# Patient Record
Sex: Male | Born: 1940
Health system: Southern US, Community
[De-identification: ages and names within clinical notes are randomized; demographics above are authoritative.]

## PROBLEM LIST (undated history)

## (undated) ENCOUNTER — Emergency Department: Admission: EM | Payer: Medicare HMO | Source: Home / Self Care

## (undated) DIAGNOSIS — M1990 Unspecified osteoarthritis, unspecified site: Secondary | ICD-10-CM

## (undated) DIAGNOSIS — D125 Benign neoplasm of sigmoid colon: Secondary | ICD-10-CM

## (undated) DIAGNOSIS — N529 Male erectile dysfunction, unspecified: Secondary | ICD-10-CM

## (undated) DIAGNOSIS — N419 Inflammatory disease of prostate, unspecified: Secondary | ICD-10-CM

## (undated) DIAGNOSIS — D72819 Decreased white blood cell count, unspecified: Secondary | ICD-10-CM

## (undated) DIAGNOSIS — D649 Anemia, unspecified: Secondary | ICD-10-CM

## (undated) DIAGNOSIS — R972 Elevated prostate specific antigen [PSA]: Secondary | ICD-10-CM

## (undated) DIAGNOSIS — J309 Allergic rhinitis, unspecified: Secondary | ICD-10-CM

## (undated) DIAGNOSIS — F319 Bipolar disorder, unspecified: Secondary | ICD-10-CM

## (undated) DIAGNOSIS — K635 Polyp of colon: Secondary | ICD-10-CM

## (undated) DIAGNOSIS — K219 Gastro-esophageal reflux disease without esophagitis: Secondary | ICD-10-CM

## (undated) HISTORY — DX: Gastro-esophageal reflux disease without esophagitis: K21.9

## (undated) HISTORY — DX: Bipolar disorder, unspecified: F31.9

## (undated) HISTORY — DX: Anemia, unspecified: D64.9

## (undated) HISTORY — PX: HERNIA REPAIR: SHX51

## (undated) HISTORY — DX: Unspecified osteoarthritis, unspecified site: M19.90

## (undated) HISTORY — DX: Male erectile dysfunction, unspecified: N52.9

## (undated) HISTORY — DX: Benign neoplasm of sigmoid colon: D12.5

## (undated) HISTORY — DX: Polyp of colon: K63.5

## (undated) HISTORY — DX: Inflammatory disease of prostate, unspecified: N41.9

## (undated) HISTORY — DX: Allergic rhinitis, unspecified: J30.9

## (undated) HISTORY — DX: Elevated prostate specific antigen (PSA): R97.20

## (undated) HISTORY — DX: Decreased white blood cell count, unspecified: D72.819

## (undated) HISTORY — PX: OTHER SURGICAL HISTORY: SHX169

---

## 1998-07-28 ENCOUNTER — Encounter: Payer: Self-pay | Admitting: Orthopedic Surgery

## 1998-07-28 ENCOUNTER — Ambulatory Visit (HOSPITAL_COMMUNITY): Admission: RE | Admit: 1998-07-28 | Discharge: 1998-07-28 | Payer: Self-pay | Admitting: Orthopedic Surgery

## 2007-02-08 ENCOUNTER — Encounter: Payer: Self-pay | Admitting: Orthopaedic Surgery

## 2007-02-22 ENCOUNTER — Encounter: Payer: Self-pay | Admitting: Orthopaedic Surgery

## 2007-05-15 ENCOUNTER — Inpatient Hospital Stay (HOSPITAL_COMMUNITY): Admission: RE | Admit: 2007-05-15 | Discharge: 2007-05-17 | Payer: Self-pay | Admitting: Orthopedic Surgery

## 2007-06-20 ENCOUNTER — Ambulatory Visit: Payer: Self-pay

## 2008-01-10 ENCOUNTER — Ambulatory Visit (HOSPITAL_COMMUNITY): Admission: RE | Admit: 2008-01-10 | Discharge: 2008-01-10 | Payer: Self-pay | Admitting: Orthopedic Surgery

## 2008-05-19 ENCOUNTER — Ambulatory Visit: Payer: Self-pay | Admitting: Gastroenterology

## 2008-07-28 ENCOUNTER — Ambulatory Visit: Payer: Self-pay

## 2008-07-31 ENCOUNTER — Ambulatory Visit: Payer: Self-pay

## 2009-07-16 ENCOUNTER — Emergency Department: Payer: Self-pay | Admitting: Emergency Medicine

## 2009-08-04 ENCOUNTER — Ambulatory Visit: Payer: Self-pay

## 2009-12-30 ENCOUNTER — Encounter: Payer: Self-pay | Admitting: Rheumatology

## 2010-01-21 ENCOUNTER — Encounter: Payer: Self-pay | Admitting: Rheumatology

## 2010-04-28 ENCOUNTER — Ambulatory Visit: Payer: Self-pay

## 2010-07-06 NOTE — Op Note (Signed)
NAME:  [name], [name] NO.:  [id number]   MEDICAL RECORD NO.:  [id number]          PATIENT TYPE:  INP   LOCATION:  2899                         FACILITY:  [facility]   PHYSICIAN:  Katy Fitch. Sypher, M.D. DATE OF BIRTH:  [birth year]   DATE OF PROCEDURE:  05/15/2007  DATE OF DISCHARGE:                               OPERATIVE REPORT   PREOPERATIVE DIAGNOSIS:  Complex right shoulder chronic rotator cuff  tear arthropathy with grade 4 chondromalacia of humeral head and grade 4  chondromalacia of glenoid with marked glenoid remodeling and humeral  head remodeling.   POSTOPERATIVE DIAGNOSIS:  Complex right shoulder chronic rotator cuff  tear arthropathy with grade 4 chondromalacia of humeral head and grade 4  chondromalacia of glenoid with marked glenoid remodeling and humeral  head remodeling.  Identification of very complex aberrant anatomy of  humeral head and glenoid with multiple osteocartilaginous loose bodies  present in the posterior and inferior shoulder capsule.   OPERATION:  Right total shoulder implant arthroplasty utilizing a Biomet  Biomodular 54 x 22 mm offset head and an 11 mm x 115 mm stem and a small  polyethylene inset glenoid with Simplex cement and vancomycin  impregnation.   OPERATING SURGEON:  Katy Fitch. Sypher, M.D.   ASSISTANT:  Molly Maduro Dasnoit PA-C.   ANESTHESIA:  General endotracheal supplemented by a right interscalene  block.   SUPERVISING ANESTHESIOLOGIST:  Dr. Judie Petit.   INDICATIONS:  David [name] is a 70 year old right-hand-dominant retired  gentleman who is a long-term patient of our practice.  In 1994, he  underwent a right shoulder arthroscopy for impingement.  He had a  subacromial decompression and distal clavicle resection.  Over the  subsequent 6 years, he developed progressive shoulder pain.  In 2000, he  had a shoulder arthroscopy which revealed an irreparable chronic rotator  cuff tear.  He was debrided at that time.   He had another 8 years of good service of his shoulder following  debridement and was able to elevate 150 degrees.  However, he began to  experience progressive pain including night pain, weakness of abduction,  external rotation and returned in 2008 to discuss his shoulder.   At that time we discussed the possibilities of reconstructing his  shoulder with a possible extended surface area hemiarthroplasty versus a  total shoulder arthroplasty versus a reverse arthroplasty.   Given his active lifestyle and the fact he had marked deformity of both  his glenoid and humeral head, in my judgment it appeared that he would  be a proper candidate for a hemiarthroplasty and glenoid component.   He had profound eburnation of his humeral head and a significant  osteophyte forming posteriorly and dorsally which had been articulating  against his acromion.   After lengthy informed consent, during which we discussed the possible  outcomes of [place] not fully relieving his pain and not restoring his full  range of motion as well as listing the potential complications of  surgery including neurovascular injury, infection, component loosening  and/or component failure and the need for later revision, [name]  presents  to the operating at this time anticipating a right total  shoulder arthroplasty.   Preoperatively, he was advised that we would use prophylactic  antibiotics in the form of Ancef and, in my experience, I also use  vancomycin in the early postoperative period to cover MRSA.  We will use  vancomycin impregnated cement for his glenoid.   Questions were invited and answered in detail.   PROCEDURE:  [name] is brought to the operating room and placed in  supine position upon the operating table.   Following the induction of interscalene block by Dr. Randa Evens in the  holding area, satisfactory anesthesia of the right arm and forequarter  was achieved.   [name] is brought to room  #1 placed in supine position upon the  operating table and after proper time-out and site identification  protocol, he was placed under general endotracheal anesthesia.  He was  carefully positioned with a specialized torso and head holder designed  for shoulder arthroplasty.   The entire right upper extremity and forequarter were prepped with  DuraPrep and draped with impervious arthroscopy drapes.  His axilla was  prepped with a clipper and his skin was prepared for postoperative tape  application.   After completion of a second time-out protocol, we proceeded directly to  the arthroplasty with creation of 15 cm anterior deltopectoral incision.  Subcutaneous tissues were carefully divided, taking care to identify the  cephalic vein.  The subdermal bleeders were electrocauterized with the  Bovie, followed by development of the deltopectoral interval.  The  clavipectoral fascia was identified and the short head of the biceps  partially released and tagged.  A self-retaining retractor was placed  allowing exposure of the anterior capsule and subscapularis bursa.  The  bursa was dissected off revealing the subscapularis.  The biceps long  head was identified followed by capsulotomy.  There was profound  thickening of the capsule and hypertrophy the coracohumeral ligament.   With a fine sharp osteotome, the capsules peeled off the lesser  tuberosity and the subscapularis tag.  After release of the  subscapularis, with great care we identified the inferior osteophyte of  the humeral head and using a Joker, elevated to protect the axillary  nerve, meticulously and piecemeal removed the inferior, anterior and  posterior osteophyte after presenting the humeral head superiorly.   After the neck of the humerus was carefully identified, the capsule was  debrided.  There was noted to be several large loose bodies posteriorly  that were removed with digital dissection and purchase.   A Cuda  retractor was placed followed by preparation of the glenoid with  a power bur using around diamond bur and an oval fluted nerve.  Due to  the fact that [name] had profound marble-like bone, the glenoid  perforation consumed nearly 1 hour.   We are able to create a inset corresponding to the shape of the medium  glenoid, however, due to some insufficiency of the glenoid anteriorly, I  elected ultimately to use a small implant.   A bathtub-type inset was created followed by four cement holes.  The  keel was dug out with a power bur followed by use of angled curettes to  undercut the subchondral bone.   After routine preparation of the glenoid with irrigation and careful  drying, the glenoid component was placed with vancomycin impregnated  cement.   This was held in position for 11 minutes as the cement set.  Redundant  cement posteriorly  was removed with a fine rongeur.   I thoroughly examined the capsule and prior to placing the humeral stem,  identified two additional inferior posterior loose bodies that were  essentially pedunculated and involved the capsule.   This required a rather complex dissection to reach and ultimately we  used both upbiting and neutral pituitary rongeurs to remove these  piecemeal.   After thorough capsular lavage, we prepared the humerus.  The humerus  was delivered.  The capsule protected.  Broaches and cutting tools were  used to repair the humerus to accept an 11 mm Biomet Biomodular stem.  Bone graft in the humeral head had been harvested and was placed in the  medullary canal to improve purchase of the stem.  The 11 mm stem was  placed with no-touch technique after cutting a dorsal keel defect in the  greater tuberosity.   The neck was driven to the humeral calcar and version was carefully  controlled.   Due to Mr. Rottmann's oversized head he had developed over the past 8  years, we ultimately used the largest offset 54 x 22 mm head to   reconstruct his humeral head.  The EAS was unsatisfactory due to not  enough diameter to cover the osteophyte at the greater tuberosity.  We  tailored the osteophyte slightly to create a very congruous fit with the  54 x 22 mm head using the offset in the #1 position superiorly.   After the humeral component was placed with standard technique, it was  tamped home on the reverse Morse taper.  The glenohumeral joint was  thoroughly lavaged followed by capsule repaired with through-bone  sutures to the lesser tuberosity.  Care was taken to reconstruct the  coracohumeral ligament superiorly.   A 7 mm JP drain was placed in the deltopectoral interval up to the  inferior capsule to prevent hematoma collection followed by exit of the  drain through a lateral stab wound at the inferior deltoid.  The wound  was then thoroughly irrigated with sterile saline followed by closure  with subdermal suture of 0 Vicryl, 2-0 Vicryl and intradermal 3-0  Prolene with Steri-Strips.   There were no apparent complications.   This case would be classified as extremely difficult due to the marked  deformity of the glenoid and humeral head as well as having to retrieve  the multiple posterior inferior loose bodies.   A very congruous articulation was achieved.  There were no apparent  complications.   Upon descrubbing, I identified that despite changing my gloves with each  component, tying Kevlar knots with the closure led to a failure of a  double gloved thumb on the left hand.  We had thoroughly irrigated the  wound and provided both preoperative Ancef and vancomycin.   We will continue Ancef and vancomycin for 48 hours postoperatively.   Mr. Pasqual was placed in a sling, awakened from general anesthesia and  transferred to the recovery room with stable vital signs.      Katy Fitch Sypher, M.D.  Electronically Signed     RVS/MEDQ  D:  05/15/2007  T:  05/15/2007  Job:  [id number]

## 2010-11-15 LAB — CBC
HCT: 35 — ABNORMAL LOW
HCT: 50.2
Hemoglobin: 12 — ABNORMAL LOW
Hemoglobin: 16.9
MCHC: 33.7
MCHC: 34.2
MCV: 92.2
MCV: 92.3
Platelets: 163
Platelets: 192
RBC: 3.8 — ABNORMAL LOW
RBC: 5.44
RDW: 13.9
RDW: 14.6
WBC: 4.1
WBC: 6.9

## 2010-11-15 LAB — BASIC METABOLIC PANEL
BUN: 14
BUN: 14
CO2: 28
CO2: 29
Calcium: 10.2
Calcium: 8.3 — ABNORMAL LOW
Chloride: 105
Chloride: 108
Creatinine, Ser: 0.92
Creatinine, Ser: 0.95
GFR calc Af Amer: >60
GFR calc Af Amer: >60
GFR calc non Af Amer: >60
GFR calc non Af Amer: >60
Glucose, Bld: 122 — ABNORMAL HIGH
Glucose, Bld: 90
Potassium: 4.3
Potassium: 5.3 — ABNORMAL HIGH
Sodium: 142
Sodium: 142

## 2010-12-10 ENCOUNTER — Ambulatory Visit: Payer: Self-pay | Admitting: Internal Medicine

## 2010-12-17 ENCOUNTER — Ambulatory Visit: Payer: Self-pay | Admitting: Internal Medicine

## 2011-01-24 ENCOUNTER — Ambulatory Visit: Payer: Self-pay

## 2011-02-28 ENCOUNTER — Encounter: Payer: Self-pay | Admitting: Orthopedic Surgery

## 2011-03-25 ENCOUNTER — Encounter: Payer: Self-pay | Admitting: Orthopedic Surgery

## 2011-05-10 ENCOUNTER — Ambulatory Visit: Payer: Self-pay

## 2011-05-10 LAB — BASIC METABOLIC PANEL
Anion Gap: 6 — ABNORMAL LOW (ref 7–16)
BUN: 21 mg/dL — ABNORMAL HIGH (ref 7–18)
Calcium, Total: 9.6 mg/dL (ref 8.5–10.1)
EGFR (African American): >60
EGFR (Non-African Amer.): 56 — ABNORMAL LOW
Glucose: 142 mg/dL — ABNORMAL HIGH (ref 65–99)
Osmolality: 294 (ref 275–301)
Potassium: 4.7 mmol/L (ref 3.5–5.1)

## 2011-05-10 LAB — CBC WITH DIFFERENTIAL/PLATELET
Basophil %: 0.5 %
Eosinophil #: 0.1 10*3/uL (ref 0.0–0.7)
Eosinophil %: 1.8 %
HGB: 15.6 g/dL (ref 13.0–18.0)
Lymphocyte #: 1 10*3/uL (ref 1.0–3.6)
MCH: 31.7 pg (ref 26.0–34.0)
MCHC: 33 g/dL (ref 32.0–36.0)
MCV: 96 fL (ref 80–100)
Monocyte #: 0.3 10*3/uL (ref 0.0–0.7)
Monocyte %: 6.6 %
Neutrophil #: 3.3 10*3/uL (ref 1.4–6.5)
Neutrophil %: 70.5 %
RBC: 4.93 10*6/uL (ref 4.40–5.90)

## 2011-09-14 ENCOUNTER — Emergency Department: Payer: Self-pay | Admitting: *Deleted

## 2011-09-14 LAB — URINALYSIS, COMPLETE
Bacteria: NONE SEEN
Bilirubin,UR: NEGATIVE
Blood: NEGATIVE
Glucose,UR: NEGATIVE mg/dL (ref 0–75)
Leukocyte Esterase: NEGATIVE
Nitrite: NEGATIVE
Protein: NEGATIVE
RBC,UR: 10 /HPF (ref 0–5)
Specific Gravity: 1.014 (ref 1.003–1.030)
Squamous Epithelial: NONE SEEN
WBC UR: 1 /HPF (ref 0–5)

## 2011-09-14 LAB — BASIC METABOLIC PANEL
Anion Gap: 9 (ref 7–16)
BUN: 18 mg/dL (ref 7–18)
Calcium, Total: 9.1 mg/dL (ref 8.5–10.1)
Chloride: 107 mmol/L (ref 98–107)
Co2: 23 mmol/L (ref 21–32)
Osmolality: 280 (ref 275–301)
Potassium: 4.1 mmol/L (ref 3.5–5.1)

## 2011-10-21 ENCOUNTER — Ambulatory Visit: Payer: Self-pay | Admitting: Gastroenterology

## 2011-10-25 LAB — PATHOLOGY REPORT

## 2012-10-20 HISTORY — PX: COLONOSCOPY: SHX174

## 2012-11-21 ENCOUNTER — Ambulatory Visit: Payer: Self-pay | Admitting: Internal Medicine

## 2012-12-13 ENCOUNTER — Ambulatory Visit: Payer: Self-pay | Admitting: Internal Medicine

## 2012-12-20 LAB — FERRITIN: Ferritin (ARMC): 19 ng/mL (ref 8–388)

## 2012-12-20 LAB — LACTATE DEHYDROGENASE: LDH: 334 U/L — ABNORMAL HIGH (ref 85–241)

## 2012-12-20 LAB — PROTIME-INR: INR: 0.9

## 2012-12-20 LAB — BASIC METABOLIC PANEL
Calcium, Total: 9 mg/dL (ref 8.5–10.1)
Co2: 30 mmol/L (ref 21–32)
Creatinine: 0.96 mg/dL (ref 0.60–1.30)
EGFR (African American): >60
EGFR (Non-African Amer.): >60
Glucose: 103 mg/dL — ABNORMAL HIGH (ref 65–99)
Osmolality: 291 (ref 275–301)
Potassium: 3.9 mmol/L (ref 3.5–5.1)
Sodium: 144 mmol/L (ref 136–145)

## 2012-12-20 LAB — URINALYSIS, COMPLETE
Bilirubin,UR: NEGATIVE
Glucose,UR: NEGATIVE mg/dL (ref 0–75)
Ketone: NEGATIVE
Nitrite: NEGATIVE
Specific Gravity: 1.02 (ref 1.003–1.030)

## 2012-12-20 LAB — CBC CANCER CENTER
Eosinophil: 5 %
HCT: 39.1 % — ABNORMAL LOW (ref 40.0–52.0)
HGB: 12.6 g/dL — ABNORMAL LOW (ref 13.0–18.0)
Lymphocytes: 30 %
MCH: 30.4 pg (ref 26.0–34.0)
MCHC: 32.3 g/dL (ref 32.0–36.0)
MCV: 94 fL (ref 80–100)
Monocytes: 7 %
RBC: 4.16 10*6/uL — ABNORMAL LOW (ref 4.40–5.90)
Segmented Neutrophils: 49 %
Variant Lymphocyte: 6 %
WBC: 3.5 x10 3/mm — ABNORMAL LOW (ref 3.8–10.6)

## 2012-12-20 LAB — FOLATE: Folic Acid: 38.1 ng/mL (ref 3.1–100.0)

## 2012-12-20 LAB — IRON AND TIBC
Iron Bind.Cap.(Total): 416 ug/dL (ref 250–450)
Iron Saturation: 8 %
Unbound Iron-Bind.Cap.: 381 ug/dL

## 2012-12-20 LAB — RETICULOCYTES: Reticulocyte: 1.63 % (ref 0.4–3.1)

## 2012-12-20 LAB — MAGNESIUM: Magnesium: 2.2 mg/dL

## 2012-12-20 LAB — APTT: Activated PTT: 30.9 secs (ref 23.6–35.9)

## 2012-12-22 ENCOUNTER — Ambulatory Visit: Payer: Self-pay | Admitting: Internal Medicine

## 2012-12-24 LAB — URINE IEP, RANDOM

## 2013-01-21 ENCOUNTER — Ambulatory Visit: Payer: Self-pay | Admitting: Internal Medicine

## 2013-03-21 ENCOUNTER — Encounter: Payer: Self-pay | Admitting: Orthopedic Surgery

## 2013-03-24 ENCOUNTER — Encounter: Payer: Self-pay | Admitting: Orthopedic Surgery

## 2013-04-04 ENCOUNTER — Ambulatory Visit: Payer: Self-pay | Admitting: Internal Medicine

## 2013-04-04 LAB — CBC CANCER CENTER
BASOS PCT: 1.1 %
Basophil #: 0 x10 3/mm (ref 0.0–0.1)
EOS ABS: 0.1 x10 3/mm (ref 0.0–0.7)
Eosinophil %: 2.9 %
HCT: 49.3 % (ref 40.0–52.0)
HGB: 16.3 g/dL (ref 13.0–18.0)
LYMPHS ABS: 1.2 x10 3/mm (ref 1.0–3.6)
Lymphocyte %: 36.7 %
MCH: 30.2 pg (ref 26.0–34.0)
MCHC: 33.1 g/dL (ref 32.0–36.0)
MCV: 91 fL (ref 80–100)
MONO ABS: 0.4 x10 3/mm (ref 0.2–1.0)
Monocyte %: 11.2 %
NEUTROS ABS: 1.6 x10 3/mm (ref 1.4–6.5)
NEUTROS PCT: 48.1 %
Platelet: 200 x10 3/mm (ref 150–440)
RBC: 5.39 10*6/uL (ref 4.40–5.90)
RDW: 18.4 % — ABNORMAL HIGH (ref 11.5–14.5)
WBC: 3.4 x10 3/mm — ABNORMAL LOW (ref 3.8–10.6)

## 2013-04-04 LAB — IRON AND TIBC
Iron Bind.Cap.(Total): 362 ug/dL (ref 250–450)
Iron Saturation: 23 %
Iron: 84 ug/dL (ref 65–175)
Unbound Iron-Bind.Cap.: 278 ug/dL

## 2013-04-04 LAB — LACTATE DEHYDROGENASE: LDH: 328 U/L — ABNORMAL HIGH (ref 85–241)

## 2013-04-04 LAB — FERRITIN: Ferritin (ARMC): 41 ng/mL (ref 8–388)

## 2013-04-21 ENCOUNTER — Ambulatory Visit: Payer: Self-pay | Admitting: Internal Medicine

## 2013-06-27 ENCOUNTER — Ambulatory Visit: Payer: Self-pay | Admitting: Internal Medicine

## 2013-06-27 LAB — CBC CANCER CENTER
BASOS ABS: 0 x10 3/mm (ref 0.0–0.1)
Basophil %: 0.8 %
EOS ABS: 0.1 x10 3/mm (ref 0.0–0.7)
Eosinophil %: 4.3 %
HCT: 45 % (ref 40.0–52.0)
HGB: 15.1 g/dL (ref 13.0–18.0)
Lymphocyte #: 1.3 x10 3/mm (ref 1.0–3.6)
Lymphocyte %: 40.7 %
MCH: 31.7 pg (ref 26.0–34.0)
MCHC: 33.6 g/dL (ref 32.0–36.0)
MCV: 94 fL (ref 80–100)
MONO ABS: 0.4 x10 3/mm (ref 0.2–1.0)
MONOS PCT: 13 %
NEUTROS PCT: 41.2 %
Neutrophil #: 1.3 x10 3/mm — ABNORMAL LOW (ref 1.4–6.5)
PLATELETS: 177 x10 3/mm (ref 150–440)
RBC: 4.79 10*6/uL (ref 4.40–5.90)
RDW: 13.9 % (ref 11.5–14.5)
WBC: 3.2 x10 3/mm — ABNORMAL LOW (ref 3.8–10.6)

## 2013-06-27 LAB — IRON AND TIBC
IRON BIND. CAP.(TOTAL): 292 ug/dL (ref 250–450)
Iron Saturation: 31 %
Iron: 90 ug/dL (ref 65–175)
Unbound Iron-Bind.Cap.: 202 ug/dL

## 2013-06-27 LAB — FERRITIN: Ferritin (ARMC): 74 ng/mL (ref 8–388)

## 2013-06-27 LAB — LACTATE DEHYDROGENASE: LDH: 291 U/L — ABNORMAL HIGH (ref 85–241)

## 2013-07-22 ENCOUNTER — Ambulatory Visit: Payer: Self-pay | Admitting: Internal Medicine

## 2013-08-18 ENCOUNTER — Ambulatory Visit: Payer: Self-pay

## 2013-09-02 ENCOUNTER — Ambulatory Visit: Payer: Self-pay | Admitting: Internal Medicine

## 2013-09-02 LAB — CBC CANCER CENTER
BASOS PCT: 1 %
Basophil #: 0 x10 3/mm (ref 0.0–0.1)
EOS ABS: 0.2 x10 3/mm (ref 0.0–0.7)
EOS PCT: 5.5 %
HCT: 46.7 % (ref 40.0–52.0)
HGB: 15.4 g/dL (ref 13.0–18.0)
Lymphocyte #: 1.3 x10 3/mm (ref 1.0–3.6)
Lymphocyte %: 39 %
MCH: 31.8 pg (ref 26.0–34.0)
MCHC: 33 g/dL (ref 32.0–36.0)
MCV: 96 fL (ref 80–100)
Monocyte #: 0.4 x10 3/mm (ref 0.2–1.0)
Monocyte %: 11.1 %
Neutrophil #: 1.5 x10 3/mm (ref 1.4–6.5)
Neutrophil %: 43.4 %
PLATELETS: 172 x10 3/mm (ref 150–440)
RBC: 4.85 10*6/uL (ref 4.40–5.90)
RDW: 14.4 % (ref 11.5–14.5)
WBC: 3.4 x10 3/mm — ABNORMAL LOW (ref 3.8–10.6)

## 2013-09-02 LAB — LACTATE DEHYDROGENASE: LDH: 247 U/L — ABNORMAL HIGH (ref 85–241)

## 2013-09-21 ENCOUNTER — Ambulatory Visit: Payer: Self-pay | Admitting: Internal Medicine

## 2013-11-04 ENCOUNTER — Ambulatory Visit: Payer: Self-pay | Admitting: Internal Medicine

## 2013-11-04 LAB — CBC CANCER CENTER
BASOS PCT: 0.9 %
Basophil #: 0 x10 3/mm (ref 0.0–0.1)
EOS PCT: 4.8 %
Eosinophil #: 0.2 x10 3/mm (ref 0.0–0.7)
HCT: 47.9 % (ref 40.0–52.0)
HGB: 15.8 g/dL (ref 13.0–18.0)
LYMPHS ABS: 1.1 x10 3/mm (ref 1.0–3.6)
Lymphocyte %: 31.5 %
MCH: 31.9 pg (ref 26.0–34.0)
MCHC: 33.1 g/dL (ref 32.0–36.0)
MCV: 96 fL (ref 80–100)
Monocyte #: 0.4 x10 3/mm (ref 0.2–1.0)
Monocyte %: 11.9 %
NEUTROS ABS: 1.8 x10 3/mm (ref 1.4–6.5)
NEUTROS PCT: 50.9 %
Platelet: 205 x10 3/mm (ref 150–440)
RBC: 4.97 10*6/uL (ref 4.40–5.90)
RDW: 14 % (ref 11.5–14.5)
WBC: 3.5 x10 3/mm — ABNORMAL LOW (ref 3.8–10.6)

## 2013-11-04 LAB — IRON AND TIBC
Iron Bind.Cap.(Total): 316 ug/dL
Iron Saturation: 48 %
Iron: 152 ug/dL
Unbound Iron-Bind.Cap.: 164 ug/dL

## 2013-11-04 LAB — LACTATE DEHYDROGENASE: LDH: 338 U/L — ABNORMAL HIGH

## 2013-11-04 LAB — FERRITIN: Ferritin (ARMC): 75 ng/mL

## 2013-11-21 ENCOUNTER — Ambulatory Visit: Payer: Self-pay | Admitting: Internal Medicine

## 2013-12-26 ENCOUNTER — Ambulatory Visit: Payer: Self-pay | Admitting: Neurology

## 2013-12-30 ENCOUNTER — Ambulatory Visit: Payer: Self-pay | Admitting: Internal Medicine

## 2014-02-26 ENCOUNTER — Encounter: Payer: Self-pay | Admitting: Neurology

## 2014-02-26 DIAGNOSIS — R262 Difficulty in walking, not elsewhere classified: Secondary | ICD-10-CM | POA: Diagnosis not present

## 2014-02-26 DIAGNOSIS — M21372 Foot drop, left foot: Secondary | ICD-10-CM | POA: Diagnosis not present

## 2014-02-26 DIAGNOSIS — M6281 Muscle weakness (generalized): Secondary | ICD-10-CM | POA: Diagnosis not present

## 2014-03-03 ENCOUNTER — Ambulatory Visit: Payer: Self-pay | Admitting: Internal Medicine

## 2014-03-03 DIAGNOSIS — Z79899 Other long term (current) drug therapy: Secondary | ICD-10-CM | POA: Diagnosis not present

## 2014-03-03 DIAGNOSIS — D509 Iron deficiency anemia, unspecified: Secondary | ICD-10-CM | POA: Diagnosis not present

## 2014-03-03 DIAGNOSIS — D72819 Decreased white blood cell count, unspecified: Secondary | ICD-10-CM | POA: Diagnosis not present

## 2014-03-03 LAB — CBC CANCER CENTER
BASOS ABS: 0 x10 3/mm (ref 0.0–0.1)
Basophil %: 1.1 %
EOS PCT: 4.6 %
Eosinophil #: 0.2 x10 3/mm (ref 0.0–0.7)
HCT: 45.4 % (ref 40.0–52.0)
HGB: 15 g/dL (ref 13.0–18.0)
LYMPHS ABS: 1.2 x10 3/mm (ref 1.0–3.6)
LYMPHS PCT: 26.7 %
MCH: 32.6 pg (ref 26.0–34.0)
MCHC: 33 g/dL (ref 32.0–36.0)
MCV: 99 fL (ref 80–100)
Monocyte #: 0.5 x10 3/mm (ref 0.2–1.0)
Monocyte %: 10.8 %
NEUTROS PCT: 56.8 %
Neutrophil #: 2.6 x10 3/mm (ref 1.4–6.5)
Platelet: 191 x10 3/mm (ref 150–440)
RBC: 4.6 10*6/uL (ref 4.40–5.90)
RDW: 15.2 % — ABNORMAL HIGH (ref 11.5–14.5)
WBC: 4.5 x10 3/mm (ref 3.8–10.6)

## 2014-03-03 LAB — IRON AND TIBC
IRON BIND. CAP.(TOTAL): 317 ug/dL (ref 250–450)
Iron Saturation: 32 %
Iron: 100 ug/dL (ref 65–175)
UNBOUND IRON-BIND. CAP.: 217 ug/dL

## 2014-03-03 LAB — FERRITIN: FERRITIN (ARMC): 157 ng/mL (ref 8–388)

## 2014-03-03 LAB — LACTATE DEHYDROGENASE: LDH: 229 U/L (ref 85–241)

## 2014-03-05 DIAGNOSIS — M21372 Foot drop, left foot: Secondary | ICD-10-CM | POA: Diagnosis not present

## 2014-03-05 DIAGNOSIS — M6281 Muscle weakness (generalized): Secondary | ICD-10-CM | POA: Diagnosis not present

## 2014-03-05 DIAGNOSIS — R262 Difficulty in walking, not elsewhere classified: Secondary | ICD-10-CM | POA: Diagnosis not present

## 2014-03-12 DIAGNOSIS — R262 Difficulty in walking, not elsewhere classified: Secondary | ICD-10-CM | POA: Diagnosis not present

## 2014-03-12 DIAGNOSIS — M21372 Foot drop, left foot: Secondary | ICD-10-CM | POA: Diagnosis not present

## 2014-03-12 DIAGNOSIS — M6281 Muscle weakness (generalized): Secondary | ICD-10-CM | POA: Diagnosis not present

## 2014-03-24 ENCOUNTER — Encounter: Payer: Self-pay | Admitting: Neurology

## 2014-03-24 ENCOUNTER — Ambulatory Visit: Payer: Self-pay | Admitting: Internal Medicine

## 2014-04-01 DIAGNOSIS — F314 Bipolar disorder, current episode depressed, severe, without psychotic features: Secondary | ICD-10-CM | POA: Diagnosis not present

## 2014-04-16 DIAGNOSIS — M21372 Foot drop, left foot: Secondary | ICD-10-CM | POA: Diagnosis not present

## 2014-04-29 DIAGNOSIS — F319 Bipolar disorder, unspecified: Secondary | ICD-10-CM | POA: Diagnosis not present

## 2014-05-05 ENCOUNTER — Ambulatory Visit
Admit: 2014-05-05 | Disposition: A | Discharge status: Home / Self Care | Payer: Self-pay | Attending: Internal Medicine | Admitting: Internal Medicine

## 2014-05-05 DIAGNOSIS — D509 Iron deficiency anemia, unspecified: Secondary | ICD-10-CM | POA: Diagnosis not present

## 2014-05-05 DIAGNOSIS — Z79899 Other long term (current) drug therapy: Secondary | ICD-10-CM | POA: Diagnosis not present

## 2014-05-05 DIAGNOSIS — D72819 Decreased white blood cell count, unspecified: Secondary | ICD-10-CM | POA: Diagnosis not present

## 2014-05-14 DIAGNOSIS — Z0100 Encounter for examination of eyes and vision without abnormal findings: Secondary | ICD-10-CM | POA: Diagnosis not present

## 2014-05-23 ENCOUNTER — Ambulatory Visit
Admit: 2014-05-23 | Disposition: A | Discharge status: Home / Self Care | Payer: Self-pay | Attending: Internal Medicine | Admitting: Internal Medicine

## 2014-05-23 DIAGNOSIS — Z111 Encounter for screening for respiratory tuberculosis: Secondary | ICD-10-CM | POA: Diagnosis not present

## 2014-05-23 DIAGNOSIS — Z0184 Encounter for antibody response examination: Secondary | ICD-10-CM | POA: Diagnosis not present

## 2014-06-16 DIAGNOSIS — L570 Actinic keratosis: Secondary | ICD-10-CM | POA: Diagnosis not present

## 2014-06-16 DIAGNOSIS — L821 Other seborrheic keratosis: Secondary | ICD-10-CM | POA: Diagnosis not present

## 2014-06-19 ENCOUNTER — Other Ambulatory Visit: Payer: Self-pay | Admitting: *Deleted

## 2014-06-19 DIAGNOSIS — D72819 Decreased white blood cell count, unspecified: Secondary | ICD-10-CM | POA: Insufficient documentation

## 2014-06-19 DIAGNOSIS — D509 Iron deficiency anemia, unspecified: Secondary | ICD-10-CM | POA: Insufficient documentation

## 2014-06-25 ENCOUNTER — Other Ambulatory Visit: Payer: Self-pay

## 2014-06-25 DIAGNOSIS — D509 Iron deficiency anemia, unspecified: Secondary | ICD-10-CM

## 2014-06-25 DIAGNOSIS — D72819 Decreased white blood cell count, unspecified: Secondary | ICD-10-CM

## 2014-06-26 ENCOUNTER — Inpatient Hospital Stay (HOSPITAL_BASED_OUTPATIENT_CLINIC_OR_DEPARTMENT_OTHER): Payer: Commercial Managed Care - HMO | Admitting: Internal Medicine

## 2014-06-26 ENCOUNTER — Inpatient Hospital Stay: Payer: Commercial Managed Care - HMO | Attending: Internal Medicine

## 2014-06-26 VITALS — BP 127/67 | HR 60 | Temp 96.0°F | Ht 69.0 in | Wt 174.2 lb

## 2014-06-26 DIAGNOSIS — Z79899 Other long term (current) drug therapy: Secondary | ICD-10-CM | POA: Diagnosis not present

## 2014-06-26 DIAGNOSIS — D72819 Decreased white blood cell count, unspecified: Secondary | ICD-10-CM | POA: Insufficient documentation

## 2014-06-26 DIAGNOSIS — D509 Iron deficiency anemia, unspecified: Secondary | ICD-10-CM

## 2014-06-26 LAB — CBC WITH DIFFERENTIAL/PLATELET
Basophils Absolute: 0 10*3/uL (ref 0–0.1)
Basophils Relative: 1 %
EOS ABS: 0.1 10*3/uL (ref 0–0.7)
Eosinophils Relative: 4 %
HCT: 45.3 % (ref 40.0–52.0)
HEMOGLOBIN: 14.9 g/dL (ref 13.0–18.0)
Lymphs Abs: 0.8 10*3/uL — ABNORMAL LOW (ref 1.0–3.6)
MCH: 30.8 pg (ref 26.0–34.0)
MCHC: 33 g/dL (ref 32.0–36.0)
MCV: 93.3 fL (ref 80.0–100.0)
Monocytes Absolute: 0.3 10*3/uL (ref 0.2–1.0)
Neutro Abs: 1.2 10*3/uL — ABNORMAL LOW (ref 1.4–6.5)
Neutrophils Relative %: 49 %
Platelets: 189 10*3/uL (ref 150–440)
RBC: 4.85 MIL/uL (ref 4.40–5.90)
RDW: 14.2 % (ref 11.5–14.5)
WBC: 2.4 10*3/uL — AB (ref 3.8–10.6)

## 2014-06-26 LAB — IRON AND TIBC
Iron: 87 ug/dL (ref 45–182)
Saturation Ratios: 25 % (ref 17.9–39.5)
TIBC: 351 ug/dL (ref 250–450)
UIBC: 264 ug/dL

## 2014-06-26 LAB — FERRITIN: Ferritin: 32 ng/mL (ref 24–336)

## 2014-06-26 LAB — LACTATE DEHYDROGENASE: LDH: 203 U/L — AB (ref 98–192)

## 2014-06-26 NOTE — Progress Notes (Signed)
[facility]  [phone] Fax:(336) [phone]     ID: MONTFORD BARG OB: 12-05-1940  MR#: [id number]  IHK#:[id number]  CHIEF COMPLAINT/DIAGNOSIS: 1. Persistent Leucopenia - unclear etiology. Mild asymptomatic.  Adequate ANC.  Workup so far negative for any obvious hematological disorder. 2. Anemia of iron def - workup on 12/20/12 shows iron deficiency anemia, patient started on oral iron.  Colonoscopy on 10/20/12 benign sigmoid polyp removed.  Labs done on 12/20/12 -  Hb12.6, MCV 94, platelets 275, WBC 3500 with 49% neutrophils, 30% lymphocytes, 6% variant lymphocytes, 5% eosinophils, 3% basophils. Ab-retic 0.066, ferritin 19, serum iron low at 35, iron saturation 8%, TIBC 416, LDH mildly elevated at 334. B12, folate, haptoglobin, direct Coombs test, FANA, SIEP, random-UIEP, HBsAg, HCV Ab, HIV Ab all unremarkable. Peripheral blood immunophenotyping unremarkable (only reports increased gamma-delta T cells of 8%). Ultrasound abdomen limited study 01/03/13 - liver and spleen unremarkable.   INTERVAL HISTORY:  Patient returns for continued hematology followup, he was seen one year ago. He had CBC monitored in between, WBC count has remained mildly below normal range, with ANC mostly in low normal range. States that he is doing well and denies any new complaints. States that he remains physically active, denies any new fatigue, dyspnea or orthopnea.  No fever or chills, no night sweats. Denies recurrent infections.  No obvious bleeding issues. No new bone pains. No new paresthesias in extremities. Appetite is good, states he had worsening depression last year and lost 20 lbs, but has gained it back once his medications were adjusted.   REVIEW OF SYSTEMS:   Review of Systems  All other systems reviewed and are negative.  As per HPI. Otherwise, a complete review of systems is negatve.  Past Medical Histroy/Past Surgical History -   Bipolar disorder  GERD  Allergic  rhinitis  Erectile dysfunction  Leucopenia  Osteoarthritis hands  h/o elevated PSA, per patient he had biopsy at [facility] around 2009-2010 negative for malignancy  h/o Prostatitis in past  Colonoscopy on 10/20/12 - sigmoid colon polyp removed, negative for dysplasia or malignancy   Family History - denies malignancy or hematological disorders   Social History - denies smoking, alcohol or recreational drug usage. Physically active and ambulatory.   HEALTH MAINTENANCE: History  Substance Use Topics  . Smoking status: Not on file  . Smokeless tobacco: Not on file  . Alcohol Use: Not on file    Allergies  Allergen Reactions  . No Known Allergies     Current Outpatient Prescriptions  Medication Sig Dispense Refill  . FLUoxetine (PROZAC) 20 MG tablet Take 20 mg by mouth daily.    . Lutein-Zeaxanthin 25-5 MG CAPS Take by mouth daily.    . meloxicam (MOBIC) 7.5 MG tablet Take 7.5 mg by mouth every other day.    . Multiple Vitamin (MULTIVITAMIN) tablet Take 1 tablet by mouth daily.    Marland Kitchen OLANZapine (ZYPREXA) 5 MG tablet Take 5 mg by mouth at bedtime.    . Cholecalciferol 4000 UNITS CAPS Take by mouth daily.     No current facility-administered medications for this visit.    OBJECTIVE: Filed Vitals:   06/26/14 0904  BP: 127/67  Pulse: 60  Temp: 96 F (35.6 C)     Body mass index is 25.71 kg/(m^2).      GENERAL: patient is alert and oriented and in no acute distress.  No icterus or  pallor. HEENT: EOMs intact. No cervical lymphadenopathy. CVS: S1S2, regular  LUNGS: Bilaterally clear to auscultation. No crepitations. ABDOMEN: Soft, nontender, no hepatosplenomegaly clinically.   EXTREMITIES: No pedal edema. LYMPHATICS: no adenopathy in axillary or inguinal areas   LAB RESULTS:   Lab Results  Component Value Date   WBC 2.4* 06/26/2014   NEUTROABS 1.2* 06/26/2014   HGB 14.9 06/26/2014   HCT 45.3 06/26/2014   MCV 93.3 06/26/2014   PLT 189 06/26/2014             ASSESSMENT/PLAN:   1. Persistent mild Leucopenia ofunclear etiology - labs reviewed and d/w patient in detail. Have explained that WBC count remains below normal range, and today it is lower than usual range at 2400. Also ANC has dropped below normal range t 1200. No anemia or thrombocytopenia. Patient is asymptomatic, no fevers or recurrent infections.  Workup in the past including peripheral blood immunophenotyping study has been negative for any obvious hematological disorder. Have discussed pursuing Bone marrow Biopsy to see if he has Myelodysplasia (MDS), and explained details about MDS and usual treatment approach. Also explained bone marrow biopsy procedure details. Patient prefers to hold off on procedure at this time, and if WBC/ANC remains unchanged or lower upon subsequent CBC monitioring thn he wants to proceed with it. Will monitor CBC/diff and serum LDH q8 weeks. Next MD f/u at 48 weeks with labs. 2. Anemia - workup on 12/20/12 showed iron deficiency anemia, patient started on oral iron. Colonoscopy on 10/20/12 benign sigmoid polyp removed. Anemia has resolved. Will monitor iron study intermittently at q24 weeks.  In between visits, the patient has been advised to call MD or come to the ER in case of fevers, chills, bleeding, acute sickness, or new symptoms. He is agreeable to his plan.   Leia Alf, MD   06/26/2014 9:59 AM

## 2014-06-26 NOTE — Progress Notes (Signed)
Patient states that overall he has been feeling pretty good. He does state that every now and then when he turns a certain way, he gets a catch in the rib area on his left side. He states that it feels like a muscle and is thinks it could be something that he did during exercise.

## 2014-06-30 ENCOUNTER — Ambulatory Visit: Payer: Commercial Managed Care - HMO

## 2014-07-03 ENCOUNTER — Ambulatory Visit: Payer: Commercial Managed Care - HMO

## 2014-07-04 ENCOUNTER — Ambulatory Visit: Payer: Commercial Managed Care - HMO

## 2014-07-08 ENCOUNTER — Other Ambulatory Visit: Payer: Self-pay

## 2014-07-08 ENCOUNTER — Other Ambulatory Visit: Payer: Self-pay | Admitting: Internal Medicine

## 2014-07-08 ENCOUNTER — Ambulatory Visit
Admission: RE | Admit: 2014-07-08 | Discharge: 2014-07-08 | Disposition: A | Discharge status: Home / Self Care | Payer: Commercial Managed Care - HMO | Source: Ambulatory Visit | Attending: Internal Medicine | Admitting: Internal Medicine

## 2014-07-08 DIAGNOSIS — D72819 Decreased white blood cell count, unspecified: Secondary | ICD-10-CM

## 2014-07-08 DIAGNOSIS — N200 Calculus of kidney: Secondary | ICD-10-CM | POA: Diagnosis not present

## 2014-07-08 DIAGNOSIS — D509 Iron deficiency anemia, unspecified: Secondary | ICD-10-CM

## 2014-07-08 DIAGNOSIS — N289 Disorder of kidney and ureter, unspecified: Secondary | ICD-10-CM | POA: Insufficient documentation

## 2014-07-10 ENCOUNTER — Telehealth: Payer: Self-pay | Admitting: *Deleted

## 2014-07-10 NOTE — Telephone Encounter (Signed)
Report read to pt explained meaning of words he did not understand and he thanked me for calling bacl

## 2014-07-11 ENCOUNTER — Telehealth: Payer: Self-pay | Admitting: Internal Medicine

## 2014-07-11 NOTE — Telephone Encounter (Signed)
He called radiology (# on sheet they gave him after ultrasound) and a radiologist told him some concerning things about his images without much detail. He would like you to call and discuss results. Please call: [phone]

## 2014-07-14 ENCOUNTER — Telehealth: Payer: Self-pay | Admitting: *Deleted

## 2014-07-16 NOTE — Telephone Encounter (Signed)
Called and left message with patient that Dr. Ma Hillock is out of town this week and I will check with him next week as to why he never told him about the chronic medical renal disease that was mentioned on the [place] report from November 2014. I also informed him that as far as the kidney stone and if he is not having any pain or blood in his urine then nothing probably needs to be done with the kidney stone at this time.

## 2014-07-16 NOTE — Telephone Encounter (Signed)
Called and spoke to patient on 07/11/14. Informed him of results of ultrasound and that it states that he has a kidney stone and chronic medical renal disease. Patient states that he is confused about the chronic medical renal disease part. I informed him that I compared his last ultrasound which was in November 2014 and it mentioned the chronic medical renal disease on that ultrasound as well. Patient states that he has never had an ultrasound before. I gave him the time and date of the ultrasound in 2014 and read to him Dr. Beverly Gust note regarding Dr. Beverly Gust plan to order ultrasound. Patient then stated that he must have had the ultrasound but surly does not remember it. He then wanted to know why Dr. Ma Hillock never mentioned this to him if it was on the last ultrasound. I told him that I did not know as to why Dr. Ma Hillock did not mention this and I would be glad to ask him. +

## 2014-07-22 DIAGNOSIS — F319 Bipolar disorder, unspecified: Secondary | ICD-10-CM | POA: Diagnosis not present

## 2014-08-18 ENCOUNTER — Inpatient Hospital Stay: Payer: Commercial Managed Care - HMO | Attending: Internal Medicine

## 2014-08-18 DIAGNOSIS — D509 Iron deficiency anemia, unspecified: Secondary | ICD-10-CM | POA: Diagnosis not present

## 2014-08-18 DIAGNOSIS — Z79899 Other long term (current) drug therapy: Secondary | ICD-10-CM | POA: Diagnosis not present

## 2014-08-18 DIAGNOSIS — D72819 Decreased white blood cell count, unspecified: Secondary | ICD-10-CM | POA: Diagnosis not present

## 2014-08-18 LAB — CBC WITH DIFFERENTIAL/PLATELET
Basophils Absolute: 0 10*3/uL (ref 0–0.1)
Basophils Relative: 1 %
EOS PCT: 4 %
Eosinophils Absolute: 0.2 10*3/uL (ref 0–0.7)
HCT: 46.7 % (ref 40.0–52.0)
Hemoglobin: 15.7 g/dL (ref 13.0–18.0)
LYMPHS ABS: 1.2 10*3/uL (ref 1.0–3.6)
Lymphocytes Relative: 32 %
MCH: 31.2 pg (ref 26.0–34.0)
MCHC: 33.7 g/dL (ref 32.0–36.0)
MCV: 92.6 fL (ref 80.0–100.0)
MONOS PCT: 11 %
Monocytes Absolute: 0.4 10*3/uL (ref 0.2–1.0)
Neutro Abs: 2 10*3/uL (ref 1.4–6.5)
Neutrophils Relative %: 52 %
PLATELETS: 183 10*3/uL (ref 150–440)
RBC: 5.04 MIL/uL (ref 4.40–5.90)
RDW: 14.7 % — ABNORMAL HIGH (ref 11.5–14.5)
WBC: 3.8 10*3/uL (ref 3.8–10.6)

## 2014-08-18 LAB — IRON AND TIBC
IRON: 104 ug/dL (ref 45–182)
Saturation Ratios: 30 % (ref 17.9–39.5)
TIBC: 346 ug/dL (ref 250–450)
UIBC: 242 ug/dL

## 2014-08-18 LAB — LACTATE DEHYDROGENASE: LDH: 229 U/L — AB (ref 98–192)

## 2014-08-18 LAB — FERRITIN: Ferritin: 34 ng/mL (ref 24–336)

## 2014-09-01 DIAGNOSIS — J40 Bronchitis, not specified as acute or chronic: Secondary | ICD-10-CM | POA: Diagnosis not present

## 2014-09-05 DIAGNOSIS — F319 Bipolar disorder, unspecified: Secondary | ICD-10-CM | POA: Diagnosis not present

## 2014-09-30 DIAGNOSIS — F319 Bipolar disorder, unspecified: Secondary | ICD-10-CM | POA: Diagnosis not present

## 2014-10-13 ENCOUNTER — Inpatient Hospital Stay: Payer: Commercial Managed Care - HMO

## 2014-10-20 ENCOUNTER — Inpatient Hospital Stay: Payer: Commercial Managed Care - HMO | Attending: Internal Medicine

## 2014-10-20 DIAGNOSIS — D509 Iron deficiency anemia, unspecified: Secondary | ICD-10-CM | POA: Insufficient documentation

## 2014-10-20 DIAGNOSIS — Z79899 Other long term (current) drug therapy: Secondary | ICD-10-CM | POA: Diagnosis not present

## 2014-10-20 DIAGNOSIS — D72819 Decreased white blood cell count, unspecified: Secondary | ICD-10-CM | POA: Diagnosis not present

## 2014-10-20 DIAGNOSIS — F319 Bipolar disorder, unspecified: Secondary | ICD-10-CM | POA: Diagnosis not present

## 2014-10-20 LAB — CBC WITH DIFFERENTIAL/PLATELET
BASOS ABS: 0 10*3/uL (ref 0–0.1)
Basophils Relative: 0 %
EOS PCT: 1 %
Eosinophils Absolute: 0 10*3/uL (ref 0–0.7)
HCT: 47 % (ref 40.0–52.0)
Hemoglobin: 15.8 g/dL (ref 13.0–18.0)
LYMPHS ABS: 0.8 10*3/uL — AB (ref 1.0–3.6)
Lymphocytes Relative: 22 %
MCH: 31.2 pg (ref 26.0–34.0)
MCHC: 33.6 g/dL (ref 32.0–36.0)
MCV: 92.8 fL (ref 80.0–100.0)
MONO ABS: 0.4 10*3/uL (ref 0.2–1.0)
Monocytes Relative: 9 %
Neutro Abs: 2.5 10*3/uL (ref 1.4–6.5)
Neutrophils Relative %: 68 %
PLATELETS: 177 10*3/uL (ref 150–440)
RBC: 5.07 MIL/uL (ref 4.40–5.90)
RDW: 14.7 % — ABNORMAL HIGH (ref 11.5–14.5)
WBC: 3.8 10*3/uL (ref 3.8–10.6)

## 2014-10-20 LAB — LACTATE DEHYDROGENASE: LDH: 166 U/L (ref 98–192)

## 2014-10-24 DIAGNOSIS — F314 Bipolar disorder, current episode depressed, severe, without psychotic features: Secondary | ICD-10-CM | POA: Diagnosis not present

## 2014-11-19 DIAGNOSIS — N39490 Overflow incontinence: Secondary | ICD-10-CM | POA: Diagnosis not present

## 2014-11-19 DIAGNOSIS — Z01818 Encounter for other preprocedural examination: Secondary | ICD-10-CM | POA: Diagnosis not present

## 2014-11-19 DIAGNOSIS — N401 Enlarged prostate with lower urinary tract symptoms: Secondary | ICD-10-CM | POA: Diagnosis not present

## 2014-11-19 DIAGNOSIS — R630 Anorexia: Secondary | ICD-10-CM | POA: Diagnosis not present

## 2014-11-19 DIAGNOSIS — Z96611 Presence of right artificial shoulder joint: Secondary | ICD-10-CM | POA: Diagnosis not present

## 2014-11-19 DIAGNOSIS — E440 Moderate protein-calorie malnutrition: Secondary | ICD-10-CM | POA: Diagnosis not present

## 2014-11-19 DIAGNOSIS — I771 Stricture of artery: Secondary | ICD-10-CM | POA: Diagnosis not present

## 2014-11-19 DIAGNOSIS — F3130 Bipolar disorder, current episode depressed, mild or moderate severity, unspecified: Secondary | ICD-10-CM | POA: Diagnosis not present

## 2014-11-19 DIAGNOSIS — F3132 Bipolar disorder, current episode depressed, moderate: Secondary | ICD-10-CM | POA: Diagnosis not present

## 2014-11-19 DIAGNOSIS — R918 Other nonspecific abnormal finding of lung field: Secondary | ICD-10-CM | POA: Diagnosis not present

## 2014-11-19 DIAGNOSIS — F329 Major depressive disorder, single episode, unspecified: Secondary | ICD-10-CM | POA: Diagnosis not present

## 2014-11-19 DIAGNOSIS — N209 Urinary calculus, unspecified: Secondary | ICD-10-CM | POA: Diagnosis not present

## 2014-11-19 DIAGNOSIS — Z96652 Presence of left artificial knee joint: Secondary | ICD-10-CM | POA: Diagnosis not present

## 2014-11-19 DIAGNOSIS — D72819 Decreased white blood cell count, unspecified: Secondary | ICD-10-CM | POA: Diagnosis not present

## 2014-11-19 DIAGNOSIS — R4182 Altered mental status, unspecified: Secondary | ICD-10-CM | POA: Diagnosis not present

## 2014-11-19 DIAGNOSIS — K5900 Constipation, unspecified: Secondary | ICD-10-CM | POA: Diagnosis not present

## 2014-11-19 DIAGNOSIS — R109 Unspecified abdominal pain: Secondary | ICD-10-CM | POA: Diagnosis not present

## 2014-11-19 DIAGNOSIS — R05 Cough: Secondary | ICD-10-CM | POA: Diagnosis not present

## 2014-11-19 DIAGNOSIS — F319 Bipolar disorder, unspecified: Secondary | ICD-10-CM | POA: Diagnosis not present

## 2014-11-19 DIAGNOSIS — F332 Major depressive disorder, recurrent severe without psychotic features: Secondary | ICD-10-CM | POA: Diagnosis not present

## 2014-11-19 DIAGNOSIS — N200 Calculus of kidney: Secondary | ICD-10-CM | POA: Diagnosis not present

## 2014-12-01 DIAGNOSIS — M25569 Pain in unspecified knee: Secondary | ICD-10-CM | POA: Diagnosis not present

## 2014-12-01 DIAGNOSIS — F3132 Bipolar disorder, current episode depressed, moderate: Secondary | ICD-10-CM | POA: Diagnosis not present

## 2014-12-01 DIAGNOSIS — F319 Bipolar disorder, unspecified: Secondary | ICD-10-CM | POA: Diagnosis not present

## 2014-12-01 DIAGNOSIS — R972 Elevated prostate specific antigen [PSA]: Secondary | ICD-10-CM | POA: Diagnosis not present

## 2014-12-01 DIAGNOSIS — Z96611 Presence of right artificial shoulder joint: Secondary | ICD-10-CM | POA: Diagnosis not present

## 2014-12-01 DIAGNOSIS — R112 Nausea with vomiting, unspecified: Secondary | ICD-10-CM | POA: Diagnosis not present

## 2014-12-01 DIAGNOSIS — R001 Bradycardia, unspecified: Secondary | ICD-10-CM | POA: Diagnosis not present

## 2014-12-01 DIAGNOSIS — Z87891 Personal history of nicotine dependence: Secondary | ICD-10-CM | POA: Diagnosis not present

## 2014-12-02 DIAGNOSIS — F314 Bipolar disorder, current episode depressed, severe, without psychotic features: Secondary | ICD-10-CM | POA: Diagnosis not present

## 2014-12-03 DIAGNOSIS — F319 Bipolar disorder, unspecified: Secondary | ICD-10-CM | POA: Diagnosis not present

## 2014-12-03 DIAGNOSIS — F332 Major depressive disorder, recurrent severe without psychotic features: Secondary | ICD-10-CM | POA: Diagnosis not present

## 2014-12-03 DIAGNOSIS — F314 Bipolar disorder, current episode depressed, severe, without psychotic features: Secondary | ICD-10-CM | POA: Diagnosis not present

## 2014-12-05 DIAGNOSIS — F3130 Bipolar disorder, current episode depressed, mild or moderate severity, unspecified: Secondary | ICD-10-CM | POA: Diagnosis not present

## 2014-12-05 DIAGNOSIS — F319 Bipolar disorder, unspecified: Secondary | ICD-10-CM | POA: Diagnosis not present

## 2014-12-05 DIAGNOSIS — F3132 Bipolar disorder, current episode depressed, moderate: Secondary | ICD-10-CM | POA: Diagnosis not present

## 2014-12-07 DIAGNOSIS — F3132 Bipolar disorder, current episode depressed, moderate: Secondary | ICD-10-CM | POA: Diagnosis not present

## 2014-12-08 ENCOUNTER — Inpatient Hospital Stay: Payer: Commercial Managed Care - HMO | Attending: Internal Medicine

## 2014-12-08 DIAGNOSIS — D509 Iron deficiency anemia, unspecified: Secondary | ICD-10-CM | POA: Diagnosis not present

## 2014-12-08 DIAGNOSIS — D72819 Decreased white blood cell count, unspecified: Secondary | ICD-10-CM | POA: Diagnosis not present

## 2014-12-08 DIAGNOSIS — Z79899 Other long term (current) drug therapy: Secondary | ICD-10-CM | POA: Diagnosis not present

## 2014-12-08 DIAGNOSIS — F3189 Other bipolar disorder: Secondary | ICD-10-CM | POA: Diagnosis not present

## 2014-12-08 DIAGNOSIS — F3132 Bipolar disorder, current episode depressed, moderate: Secondary | ICD-10-CM | POA: Diagnosis not present

## 2014-12-08 LAB — FERRITIN: FERRITIN: 203 ng/mL (ref 24–336)

## 2014-12-08 LAB — CBC WITH DIFFERENTIAL/PLATELET
Basophils Absolute: 0 10*3/uL (ref 0–0.1)
Basophils Relative: 1 %
EOS ABS: 0.1 10*3/uL (ref 0–0.7)
Eosinophils Relative: 4 %
HCT: 44.2 % (ref 40.0–52.0)
Hemoglobin: 14.8 g/dL (ref 13.0–18.0)
Lymphocytes Relative: 24 %
Lymphs Abs: 0.9 10*3/uL — ABNORMAL LOW (ref 1.0–3.6)
MCH: 31.5 pg (ref 26.0–34.0)
MCHC: 33.4 g/dL (ref 32.0–36.0)
MCV: 94.4 fL (ref 80.0–100.0)
MONO ABS: 0.3 10*3/uL (ref 0.2–1.0)
MONOS PCT: 9 %
NEUTROS PCT: 62 %
Neutro Abs: 2.5 10*3/uL (ref 1.4–6.5)
Platelets: 224 10*3/uL (ref 150–440)
RBC: 4.69 MIL/uL (ref 4.40–5.90)
RDW: 14.8 % — AB (ref 11.5–14.5)
WBC: 3.9 10*3/uL (ref 3.8–10.6)

## 2014-12-08 LAB — IRON AND TIBC
Iron: 76 ug/dL (ref 45–182)
Saturation Ratios: 26 % (ref 17.9–39.5)
TIBC: 292 ug/dL (ref 250–450)
UIBC: 216 ug/dL

## 2014-12-08 LAB — LACTATE DEHYDROGENASE: LDH: 167 U/L (ref 98–192)

## 2014-12-08 LAB — VITAMIN B12: Vitamin B-12: 309 pg/mL (ref 180–914)

## 2014-12-08 LAB — FOLATE: Folate: 14.4 ng/mL (ref >5.9)

## 2014-12-12 DIAGNOSIS — F411 Generalized anxiety disorder: Secondary | ICD-10-CM | POA: Diagnosis not present

## 2014-12-12 DIAGNOSIS — Z96611 Presence of right artificial shoulder joint: Secondary | ICD-10-CM | POA: Diagnosis not present

## 2014-12-12 DIAGNOSIS — F319 Bipolar disorder, unspecified: Secondary | ICD-10-CM | POA: Diagnosis not present

## 2014-12-12 DIAGNOSIS — F3132 Bipolar disorder, current episode depressed, moderate: Secondary | ICD-10-CM | POA: Diagnosis not present

## 2014-12-12 DIAGNOSIS — Z87891 Personal history of nicotine dependence: Secondary | ICD-10-CM | POA: Diagnosis not present

## 2014-12-12 DIAGNOSIS — F332 Major depressive disorder, recurrent severe without psychotic features: Secondary | ICD-10-CM | POA: Diagnosis not present

## 2014-12-15 DIAGNOSIS — F3132 Bipolar disorder, current episode depressed, moderate: Secondary | ICD-10-CM | POA: Diagnosis not present

## 2014-12-15 DIAGNOSIS — F319 Bipolar disorder, unspecified: Secondary | ICD-10-CM | POA: Diagnosis not present

## 2014-12-19 DIAGNOSIS — F332 Major depressive disorder, recurrent severe without psychotic features: Secondary | ICD-10-CM | POA: Diagnosis not present

## 2014-12-19 DIAGNOSIS — F3175 Bipolar disorder, in partial remission, most recent episode depressed: Secondary | ICD-10-CM | POA: Diagnosis not present

## 2014-12-22 DIAGNOSIS — F332 Major depressive disorder, recurrent severe without psychotic features: Secondary | ICD-10-CM | POA: Diagnosis not present

## 2014-12-22 DIAGNOSIS — F3175 Bipolar disorder, in partial remission, most recent episode depressed: Secondary | ICD-10-CM | POA: Diagnosis not present

## 2014-12-22 DIAGNOSIS — Z87891 Personal history of nicotine dependence: Secondary | ICD-10-CM | POA: Diagnosis not present

## 2014-12-22 DIAGNOSIS — F319 Bipolar disorder, unspecified: Secondary | ICD-10-CM | POA: Diagnosis not present

## 2014-12-25 DIAGNOSIS — M79672 Pain in left foot: Secondary | ICD-10-CM | POA: Diagnosis not present

## 2014-12-25 DIAGNOSIS — B070 Plantar wart: Secondary | ICD-10-CM | POA: Diagnosis not present

## 2015-01-02 DIAGNOSIS — F319 Bipolar disorder, unspecified: Secondary | ICD-10-CM | POA: Diagnosis not present

## 2015-01-07 DIAGNOSIS — B070 Plantar wart: Secondary | ICD-10-CM | POA: Diagnosis not present

## 2015-01-07 DIAGNOSIS — M79672 Pain in left foot: Secondary | ICD-10-CM | POA: Diagnosis not present

## 2015-01-30 DIAGNOSIS — H2510 Age-related nuclear cataract, unspecified eye: Secondary | ICD-10-CM | POA: Diagnosis not present

## 2015-01-30 DIAGNOSIS — H353131 Nonexudative age-related macular degeneration, bilateral, early dry stage: Secondary | ICD-10-CM | POA: Diagnosis not present

## 2015-02-02 ENCOUNTER — Inpatient Hospital Stay: Payer: Commercial Managed Care - HMO

## 2015-02-05 DIAGNOSIS — F314 Bipolar disorder, current episode depressed, severe, without psychotic features: Secondary | ICD-10-CM | POA: Diagnosis not present

## 2015-02-05 DIAGNOSIS — G4710 Hypersomnia, unspecified: Secondary | ICD-10-CM | POA: Diagnosis not present

## 2015-02-06 ENCOUNTER — Inpatient Hospital Stay: Payer: Commercial Managed Care - HMO | Attending: Internal Medicine

## 2015-02-06 ENCOUNTER — Telehealth: Payer: Self-pay | Admitting: Oncology

## 2015-02-06 DIAGNOSIS — D509 Iron deficiency anemia, unspecified: Secondary | ICD-10-CM | POA: Diagnosis not present

## 2015-02-06 DIAGNOSIS — Z79899 Other long term (current) drug therapy: Secondary | ICD-10-CM | POA: Insufficient documentation

## 2015-02-06 DIAGNOSIS — D72819 Decreased white blood cell count, unspecified: Secondary | ICD-10-CM | POA: Insufficient documentation

## 2015-02-06 LAB — CBC WITH DIFFERENTIAL/PLATELET
Basophils Absolute: 0 10*3/uL (ref 0–0.1)
Basophils Relative: 1 %
Eosinophils Absolute: 0 10*3/uL (ref 0–0.7)
Eosinophils Relative: 1 %
HEMATOCRIT: 46.5 % (ref 40.0–52.0)
Hemoglobin: 15.7 g/dL (ref 13.0–18.0)
LYMPHS PCT: 31 %
Lymphs Abs: 0.9 10*3/uL — ABNORMAL LOW (ref 1.0–3.6)
MCH: 32.4 pg (ref 26.0–34.0)
MCHC: 33.7 g/dL (ref 32.0–36.0)
MCV: 96 fL (ref 80.0–100.0)
MONO ABS: 0.2 10*3/uL (ref 0.2–1.0)
MONOS PCT: 8 %
NEUTROS ABS: 1.8 10*3/uL (ref 1.4–6.5)
Neutrophils Relative %: 59 %
Platelets: 168 10*3/uL (ref 150–440)
RBC: 4.85 MIL/uL (ref 4.40–5.90)
RDW: 14.5 % (ref 11.5–14.5)
WBC: 3 10*3/uL — ABNORMAL LOW (ref 3.8–10.6)

## 2015-02-06 LAB — FERRITIN: FERRITIN: 102 ng/mL (ref 24–336)

## 2015-02-06 LAB — LACTATE DEHYDROGENASE: LDH: 152 U/L (ref 98–192)

## 2015-02-06 LAB — IRON AND TIBC
IRON: 92 ug/dL (ref 45–182)
SATURATION RATIOS: 29 % (ref 17.9–39.5)
TIBC: 314 ug/dL (ref 250–450)
UIBC: 222 ug/dL

## 2015-02-06 NOTE — Telephone Encounter (Signed)
Patient requests that all lab results (past and future) from his visits with [place] be sent to Dr. Raechel Ache at [facility]. He also asks that you please call him to let him know when the lab results are in.

## 2015-02-13 ENCOUNTER — Other Ambulatory Visit
Admission: RE | Admit: 2015-02-13 | Discharge: 2015-02-13 | Disposition: A | Discharge status: Home / Self Care | Payer: Commercial Managed Care - HMO | Source: Ambulatory Visit | Attending: Psychiatry | Admitting: Psychiatry

## 2015-02-13 DIAGNOSIS — F314 Bipolar disorder, current episode depressed, severe, without psychotic features: Secondary | ICD-10-CM | POA: Insufficient documentation

## 2015-02-13 LAB — LITHIUM LEVEL: Lithium Lvl: 0.29 mmol/L — ABNORMAL LOW (ref 0.60–1.20)

## 2015-02-26 DIAGNOSIS — R05 Cough: Secondary | ICD-10-CM | POA: Diagnosis not present

## 2015-02-26 DIAGNOSIS — E559 Vitamin D deficiency, unspecified: Secondary | ICD-10-CM | POA: Diagnosis not present

## 2015-02-26 DIAGNOSIS — E7800 Pure hypercholesterolemia, unspecified: Secondary | ICD-10-CM | POA: Diagnosis not present

## 2015-02-26 DIAGNOSIS — E0590 Thyrotoxicosis, unspecified without thyrotoxic crisis or storm: Secondary | ICD-10-CM | POA: Diagnosis not present

## 2015-02-26 DIAGNOSIS — F3131 Bipolar disorder, current episode depressed, mild: Secondary | ICD-10-CM | POA: Diagnosis not present

## 2015-02-26 DIAGNOSIS — Z0000 Encounter for general adult medical examination without abnormal findings: Secondary | ICD-10-CM | POA: Diagnosis not present

## 2015-02-26 DIAGNOSIS — N401 Enlarged prostate with lower urinary tract symptoms: Secondary | ICD-10-CM | POA: Diagnosis not present

## 2015-02-26 DIAGNOSIS — K5900 Constipation, unspecified: Secondary | ICD-10-CM | POA: Diagnosis not present

## 2015-03-12 DIAGNOSIS — G4710 Hypersomnia, unspecified: Secondary | ICD-10-CM | POA: Diagnosis not present

## 2015-03-12 DIAGNOSIS — F314 Bipolar disorder, current episode depressed, severe, without psychotic features: Secondary | ICD-10-CM | POA: Diagnosis not present

## 2015-03-30 ENCOUNTER — Inpatient Hospital Stay: Payer: Commercial Managed Care - HMO | Attending: Internal Medicine

## 2015-03-30 ENCOUNTER — Other Ambulatory Visit: Payer: Self-pay | Admitting: *Deleted

## 2015-03-30 DIAGNOSIS — D509 Iron deficiency anemia, unspecified: Secondary | ICD-10-CM | POA: Diagnosis not present

## 2015-03-30 DIAGNOSIS — D72819 Decreased white blood cell count, unspecified: Secondary | ICD-10-CM | POA: Insufficient documentation

## 2015-03-30 DIAGNOSIS — Z79899 Other long term (current) drug therapy: Secondary | ICD-10-CM | POA: Diagnosis not present

## 2015-03-30 LAB — CBC WITH DIFFERENTIAL/PLATELET
BASOS PCT: 1 %
Basophils Absolute: 0 10*3/uL (ref 0–0.1)
EOS ABS: 0.1 10*3/uL (ref 0–0.7)
EOS PCT: 4 %
HCT: 45.4 % (ref 40.0–52.0)
Hemoglobin: 15.2 g/dL (ref 13.0–18.0)
LYMPHS ABS: 1.2 10*3/uL (ref 1.0–3.6)
Lymphocytes Relative: 32 %
MCH: 32.3 pg (ref 26.0–34.0)
MCHC: 33.5 g/dL (ref 32.0–36.0)
MCV: 96.5 fL (ref 80.0–100.0)
Monocytes Absolute: 0.4 10*3/uL (ref 0.2–1.0)
Monocytes Relative: 11 %
NEUTROS PCT: 52 %
Neutro Abs: 2 10*3/uL (ref 1.4–6.5)
PLATELETS: 190 10*3/uL (ref 150–440)
RBC: 4.7 MIL/uL (ref 4.40–5.90)
RDW: 13.9 % (ref 11.5–14.5)
WBC: 3.7 10*3/uL — AB (ref 3.8–10.6)

## 2015-03-30 LAB — LACTATE DEHYDROGENASE: LDH: 173 U/L (ref 98–192)

## 2015-05-19 DIAGNOSIS — M79672 Pain in left foot: Secondary | ICD-10-CM | POA: Diagnosis not present

## 2015-05-19 DIAGNOSIS — D2372 Other benign neoplasm of skin of left lower limb, including hip: Secondary | ICD-10-CM | POA: Diagnosis not present

## 2015-05-21 DIAGNOSIS — F314 Bipolar disorder, current episode depressed, severe, without psychotic features: Secondary | ICD-10-CM | POA: Diagnosis not present

## 2015-05-28 ENCOUNTER — Other Ambulatory Visit: Payer: Commercial Managed Care - HMO

## 2015-05-28 ENCOUNTER — Ambulatory Visit: Payer: Commercial Managed Care - HMO

## 2015-06-02 ENCOUNTER — Inpatient Hospital Stay: Payer: Commercial Managed Care - HMO | Attending: Internal Medicine

## 2015-06-02 ENCOUNTER — Other Ambulatory Visit: Payer: Self-pay | Admitting: *Deleted

## 2015-06-02 ENCOUNTER — Encounter: Payer: Self-pay | Admitting: *Deleted

## 2015-06-02 ENCOUNTER — Inpatient Hospital Stay (HOSPITAL_BASED_OUTPATIENT_CLINIC_OR_DEPARTMENT_OTHER): Payer: Commercial Managed Care - HMO | Admitting: Internal Medicine

## 2015-06-02 VITALS — BP 104/65 | HR 63 | Temp 97.5°F | Resp 18 | Wt 153.4 lb

## 2015-06-02 DIAGNOSIS — M1990 Unspecified osteoarthritis, unspecified site: Secondary | ICD-10-CM

## 2015-06-02 DIAGNOSIS — F319 Bipolar disorder, unspecified: Secondary | ICD-10-CM

## 2015-06-02 DIAGNOSIS — D72819 Decreased white blood cell count, unspecified: Secondary | ICD-10-CM

## 2015-06-02 DIAGNOSIS — D509 Iron deficiency anemia, unspecified: Secondary | ICD-10-CM | POA: Insufficient documentation

## 2015-06-02 DIAGNOSIS — K219 Gastro-esophageal reflux disease without esophagitis: Secondary | ICD-10-CM | POA: Insufficient documentation

## 2015-06-02 DIAGNOSIS — Z86010 Personal history of colonic polyps: Secondary | ICD-10-CM | POA: Insufficient documentation

## 2015-06-02 DIAGNOSIS — Z87891 Personal history of nicotine dependence: Secondary | ICD-10-CM | POA: Insufficient documentation

## 2015-06-02 DIAGNOSIS — N529 Male erectile dysfunction, unspecified: Secondary | ICD-10-CM | POA: Diagnosis not present

## 2015-06-02 DIAGNOSIS — Z79899 Other long term (current) drug therapy: Secondary | ICD-10-CM | POA: Insufficient documentation

## 2015-06-02 LAB — CBC WITH DIFFERENTIAL/PLATELET
Basophils Absolute: 0 10*3/uL (ref 0–0.1)
Basophils Relative: 1 %
Eosinophils Absolute: 0.1 10*3/uL (ref 0–0.7)
Eosinophils Relative: 2 %
HEMATOCRIT: 44.7 % (ref 40.0–52.0)
HEMOGLOBIN: 15 g/dL (ref 13.0–18.0)
LYMPHS ABS: 0.8 10*3/uL — AB (ref 1.0–3.6)
LYMPHS PCT: 24 %
MCH: 32.3 pg (ref 26.0–34.0)
MCHC: 33.5 g/dL (ref 32.0–36.0)
MCV: 96.3 fL (ref 80.0–100.0)
MONOS PCT: 9 %
Monocytes Absolute: 0.3 10*3/uL (ref 0.2–1.0)
NEUTROS ABS: 2.1 10*3/uL (ref 1.4–6.5)
NEUTROS PCT: 64 %
PLATELETS: 184 10*3/uL (ref 150–440)
RBC: 4.65 MIL/uL (ref 4.40–5.90)
RDW: 13.8 % (ref 11.5–14.5)
WBC: 3.3 10*3/uL — AB (ref 3.8–10.6)

## 2015-06-02 LAB — COMPREHENSIVE METABOLIC PANEL
ALK PHOS: 74 U/L (ref 38–126)
ALT: 18 U/L (ref 17–63)
AST: 30 U/L (ref 15–41)
Albumin: 4.3 g/dL (ref 3.5–5.0)
Anion gap: 3 — ABNORMAL LOW (ref 5–15)
BUN: 21 mg/dL — AB (ref 6–20)
CHLORIDE: 108 mmol/L (ref 101–111)
CO2: 26 mmol/L (ref 22–32)
CREATININE: 1.29 mg/dL — AB (ref 0.61–1.24)
Calcium: 9.5 mg/dL (ref 8.9–10.3)
GFR calc Af Amer: >60 mL/min (ref >60)
GFR, EST NON AFRICAN AMERICAN: 52 mL/min — AB (ref >60)
Glucose, Bld: 91 mg/dL (ref 65–99)
Potassium: 4 mmol/L (ref 3.5–5.1)
SODIUM: 137 mmol/L (ref 135–145)
Total Bilirubin: 1 mg/dL (ref 0.3–1.2)
Total Protein: 6.7 g/dL (ref 6.5–8.1)

## 2015-06-02 LAB — IRON AND TIBC
IRON: 113 ug/dL (ref 45–182)
Saturation Ratios: 34 % (ref 17.9–39.5)
TIBC: 328 ug/dL (ref 250–450)
UIBC: 215 ug/dL

## 2015-06-02 LAB — LACTATE DEHYDROGENASE: LDH: 197 U/L — AB (ref 98–192)

## 2015-06-02 LAB — FERRITIN: Ferritin: 82 ng/mL (ref 24–336)

## 2015-06-02 NOTE — Progress Notes (Signed)
[facility] OFFICE PROGRESS NOTE  Patient Care Team: Ezequiel Kayser, MD as PCP - General (Internal Medicine)   SUMMARY OF ONCOLOGIC HISTORY:  # 2014- CHRONIC  LEUCOPENIA/Lymphopenia; [ NOV 2014- [place] normal spleen/liver; SIEP/HbsAg;HCV/HIV-neg; peripheral blood flow- unremarkable]   # Oct 2014- [place] [Aug 2014- polyp]- on PO iron   INTERVAL HISTORY: This is my first interaction with the patient since I joined the practice September 2016. I reviewed the patient's prior charts/pertinent labs/imaging in detail; findings are summarized above.   75 year old male patient with above history of chronic leukopenia/lymphopenia is here for follow-up. Patient's appetite is good. He is not losing any weight. No chest pain shortness of breath or cough. Denies any night sweats.   REVIEW OF SYSTEMS:  A complete 10 point review of system is done which is negative except mentioned above/history of present illness.   PAST MEDICAL HISTORY :  Past Medical History  Diagnosis Date  . Leucopenia   . Anemia   . Sigmoid polyp   . Bipolar disorder ([place])   . GERD (gastroesophageal reflux disease)   . Allergic rhinitis   . Erectile dysfunction   . Osteoarthritis     in hands  . Elevated PSA     had biopsy at [facility] around 2009-2010 negative for malignancy  . Prostatitis     PAST SURGICAL HISTORY :   Past Surgical History  Procedure Laterality Date  . Colonoscopy  10/20/12    FAMILY HISTORY :  No family history on file.  SOCIAL HISTORY:   Social History  Substance Use Topics  . Smoking status: Former Smoker    Types: Cigarettes    Quit date: 02/22/1975  . Smokeless tobacco: Never Used  . Alcohol Use: Not on file    ALLERGIES:  is allergic to no known allergies and quetiapine.  MEDICATIONS:  Current Outpatient Prescriptions  Medication Sig Dispense Refill  . acetaminophen (TYLENOL) 500 MG tablet Take 500 mg by mouth.    . lithium carbonate (LITHOBID) 300 MG CR tablet Take 300  mg by mouth.    . lithium carbonate 150 MG capsule Take 150 mg by mouth.    . Lutein-Zeaxanthin 25-5 MG CAPS Take by mouth daily.    . methylphenidate 36 MG PO CR tablet Take 36 mg by mouth.    . Multiple Vitamin (MULTIVITAMIN) tablet Take 1 tablet by mouth daily.    Marland Kitchen amoxicillin (AMOXIL) 500 MG capsule TAKE 4 CAPSULES BY MOUTH 1 HOUR PROIR TO TREATMENT  1  . Cholecalciferol (D 1000) 1000 units capsule Take by mouth.     No current facility-administered medications for this visit.    PHYSICAL EXAMINATION:   BP 104/65 mmHg  Pulse 63  Temp(Src) 97.5 F (36.4 C) (Tympanic)  Resp 18  Wt 153 lb 7 oz (69.6 kg)  Filed Weights   06/02/15 1037  Weight: 153 lb 7 oz (69.6 kg)    GENERAL: Well-nourished well-developed; Alert, no distress and comfortable.   Alone.  EYES: no pallor or icterus OROPHARYNX: no thrush or ulceration; good dentition  NECK: supple, no masses felt LYMPH:  no palpable lymphadenopathy in the cervical, axillary or inguinal regions LUNGS: clear to auscultation and  No wheeze or crackles HEART/CVS: regular rate & rhythm and no murmurs; No lower extremity edema ABDOMEN:abdomen soft, non-tender and normal bowel sounds Musculoskeletal:no cyanosis of digits and no clubbing  PSYCH: alert & oriented x 3 with fluent speech NEURO: no focal motor/sensory deficits SKIN:  no rashes or  significant lesions  LABORATORY DATA:  I have reviewed the data as listed    Component Value Date/Time   NA 137 06/02/2015 1010   NA 144 12/20/2012 1040   K 4.0 06/02/2015 1010   K 3.9 12/20/2012 1040   CL 108 06/02/2015 1010   CL 106 12/20/2012 1040   CO2 26 06/02/2015 1010   CO2 30 12/20/2012 1040   GLUCOSE 91 06/02/2015 1010   GLUCOSE 103* 12/20/2012 1040   BUN 21* 06/02/2015 1010   BUN 23* 12/20/2012 1040   CREATININE 1.29* 06/02/2015 1010   CREATININE 0.96 12/20/2012 1040   CALCIUM 9.5 06/02/2015 1010   CALCIUM 9.0 12/20/2012 1040   PROT 6.7 06/02/2015 1010   ALBUMIN 4.3  06/02/2015 1010   AST 30 06/02/2015 1010   ALT 18 06/02/2015 1010   ALKPHOS 74 06/02/2015 1010   BILITOT 1.0 06/02/2015 1010   GFRNONAA 52* 06/02/2015 1010   GFRNONAA >60 12/20/2012 1040   GFRNONAA 56* 05/10/2011 1428   GFRAA >60 06/02/2015 1010   GFRAA >60 12/20/2012 1040   GFRAA >60 05/10/2011 1428    No results found for: SPEP, UPEP  Lab Results  Component Value Date   WBC 3.3* 06/02/2015   NEUTROABS 2.1 06/02/2015   HGB 15.0 06/02/2015   HCT 44.7 06/02/2015   MCV 96.3 06/02/2015   PLT 184 06/02/2015      Chemistry      Component Value Date/Time   NA 137 06/02/2015 1010   NA 144 12/20/2012 1040   K 4.0 06/02/2015 1010   K 3.9 12/20/2012 1040   CL 108 06/02/2015 1010   CL 106 12/20/2012 1040   CO2 26 06/02/2015 1010   CO2 30 12/20/2012 1040   BUN 21* 06/02/2015 1010   BUN 23* 12/20/2012 1040   CREATININE 1.29* 06/02/2015 1010   CREATININE 0.96 12/20/2012 1040      Component Value Date/Time   CALCIUM 9.5 06/02/2015 1010   CALCIUM 9.0 12/20/2012 1040   ALKPHOS 74 06/02/2015 1010   AST 30 06/02/2015 1010   ALT 18 06/02/2015 1010   BILITOT 1.0 06/02/2015 1010       ASSESSMENT & PLAN:   # Mild leukopenia/lymphopenia- today  white count is 3.3/ lymphocyte count 800; absolute neutrophil count of 2.1- Normal hemoglobin and platelets. Patient is fairly asymptomatic from his mild leukopenia. Discussed that bone marrow biopsy would be the next step if his counts started dropping/or if he becomes symptomatic with frequent infections/weight loss or night sweats. For now recommend surveillance.  # History of iron deficiency anemia- hemoglobin today 15. Await on the iron studies from today.  # Creatinine is 1.29/ labs available after the patient left. We will inform patient the labs/10 and studies are also available. He will continue to follow up with PCP.  # Follow-up with me in one year with CBC or sooner if symptomatic.     Cammie Sickle, MD 06/02/2015  10:58 AM

## 2015-06-03 ENCOUNTER — Telehealth: Payer: Self-pay | Admitting: *Deleted

## 2015-06-03 NOTE — Telephone Encounter (Signed)
Called patient to inform him that his creatinine was slightly high @ 1.29, however, iron levels were normal.  Patient advised to follow up with PCP.  Verbalized understanding.

## 2015-06-03 NOTE — Telephone Encounter (Signed)
-----   Message from Cammie Sickle, MD sent at 06/03/2015  7:52 AM EDT ----- Please inform pt that his creatinine is slightly high at 1.29/ and his iron studies came back normal. He should continue to follow up with his pcp. Thx

## 2015-06-25 DIAGNOSIS — F314 Bipolar disorder, current episode depressed, severe, without psychotic features: Secondary | ICD-10-CM | POA: Diagnosis not present

## 2015-07-30 DIAGNOSIS — F314 Bipolar disorder, current episode depressed, severe, without psychotic features: Secondary | ICD-10-CM | POA: Diagnosis not present

## 2015-08-27 DIAGNOSIS — F3130 Bipolar disorder, current episode depressed, mild or moderate severity, unspecified: Secondary | ICD-10-CM | POA: Diagnosis not present

## 2015-09-03 DIAGNOSIS — R233 Spontaneous ecchymoses: Secondary | ICD-10-CM | POA: Diagnosis not present

## 2015-09-03 DIAGNOSIS — L570 Actinic keratosis: Secondary | ICD-10-CM | POA: Diagnosis not present

## 2015-09-08 DIAGNOSIS — M79672 Pain in left foot: Secondary | ICD-10-CM | POA: Diagnosis not present

## 2015-09-08 DIAGNOSIS — D2372 Other benign neoplasm of skin of left lower limb, including hip: Secondary | ICD-10-CM | POA: Diagnosis not present

## 2015-09-08 DIAGNOSIS — S90222A Contusion of left lesser toe(s) with damage to nail, initial encounter: Secondary | ICD-10-CM | POA: Diagnosis not present

## 2015-09-09 DIAGNOSIS — K120 Recurrent oral aphthae: Secondary | ICD-10-CM | POA: Diagnosis not present

## 2015-09-17 DIAGNOSIS — F3130 Bipolar disorder, current episode depressed, mild or moderate severity, unspecified: Secondary | ICD-10-CM | POA: Diagnosis not present

## 2015-10-29 DIAGNOSIS — F3130 Bipolar disorder, current episode depressed, mild or moderate severity, unspecified: Secondary | ICD-10-CM | POA: Diagnosis not present

## 2015-11-09 DIAGNOSIS — D2372 Other benign neoplasm of skin of left lower limb, including hip: Secondary | ICD-10-CM | POA: Diagnosis not present

## 2015-11-09 DIAGNOSIS — M79672 Pain in left foot: Secondary | ICD-10-CM | POA: Diagnosis not present

## 2015-11-09 DIAGNOSIS — M2042 Other hammer toe(s) (acquired), left foot: Secondary | ICD-10-CM | POA: Diagnosis not present

## 2015-12-10 DIAGNOSIS — F314 Bipolar disorder, current episode depressed, severe, without psychotic features: Secondary | ICD-10-CM | POA: Diagnosis not present

## 2016-01-01 DIAGNOSIS — R0981 Nasal congestion: Secondary | ICD-10-CM | POA: Diagnosis not present

## 2016-01-01 DIAGNOSIS — K1379 Other lesions of oral mucosa: Secondary | ICD-10-CM | POA: Diagnosis not present

## 2016-01-01 DIAGNOSIS — Z23 Encounter for immunization: Secondary | ICD-10-CM | POA: Diagnosis not present

## 2016-01-29 DIAGNOSIS — H6123 Impacted cerumen, bilateral: Secondary | ICD-10-CM | POA: Diagnosis not present

## 2016-01-29 DIAGNOSIS — J301 Allergic rhinitis due to pollen: Secondary | ICD-10-CM | POA: Diagnosis not present

## 2016-01-29 DIAGNOSIS — J310 Chronic rhinitis: Secondary | ICD-10-CM | POA: Diagnosis not present

## 2016-01-29 DIAGNOSIS — H903 Sensorineural hearing loss, bilateral: Secondary | ICD-10-CM | POA: Diagnosis not present

## 2016-02-04 DIAGNOSIS — F3130 Bipolar disorder, current episode depressed, mild or moderate severity, unspecified: Secondary | ICD-10-CM | POA: Diagnosis not present

## 2016-02-29 ENCOUNTER — Inpatient Hospital Stay
Admission: EM | Admit: 2016-02-29 | Discharge: 2016-03-03 | DRG: 871 | Disposition: A | Discharge status: Home Health | Payer: Medicare HMO | Attending: Internal Medicine | Admitting: Internal Medicine

## 2016-02-29 ENCOUNTER — Inpatient Hospital Stay: Payer: Medicare HMO

## 2016-02-29 ENCOUNTER — Emergency Department: Payer: Medicare HMO

## 2016-02-29 ENCOUNTER — Encounter: Payer: Self-pay | Admitting: Emergency Medicine

## 2016-02-29 DIAGNOSIS — I959 Hypotension, unspecified: Secondary | ICD-10-CM | POA: Diagnosis present

## 2016-02-29 DIAGNOSIS — K219 Gastro-esophageal reflux disease without esophagitis: Secondary | ICD-10-CM | POA: Diagnosis present

## 2016-02-29 DIAGNOSIS — S0990XA Unspecified injury of head, initial encounter: Secondary | ICD-10-CM | POA: Diagnosis not present

## 2016-02-29 DIAGNOSIS — Z96611 Presence of right artificial shoulder joint: Secondary | ICD-10-CM | POA: Diagnosis present

## 2016-02-29 DIAGNOSIS — J9690 Respiratory failure, unspecified, unspecified whether with hypoxia or hypercapnia: Secondary | ICD-10-CM

## 2016-02-29 DIAGNOSIS — R509 Fever, unspecified: Secondary | ICD-10-CM | POA: Diagnosis not present

## 2016-02-29 DIAGNOSIS — A419 Sepsis, unspecified organism: Principal | ICD-10-CM | POA: Diagnosis present

## 2016-02-29 DIAGNOSIS — Y92019 Unspecified place in single-family (private) house as the place of occurrence of the external cause: Secondary | ICD-10-CM | POA: Diagnosis not present

## 2016-02-29 DIAGNOSIS — M6281 Muscle weakness (generalized): Secondary | ICD-10-CM

## 2016-02-29 DIAGNOSIS — R319 Hematuria, unspecified: Secondary | ICD-10-CM | POA: Diagnosis present

## 2016-02-29 DIAGNOSIS — D696 Thrombocytopenia, unspecified: Secondary | ICD-10-CM | POA: Diagnosis not present

## 2016-02-29 DIAGNOSIS — S0101XA Laceration without foreign body of scalp, initial encounter: Secondary | ICD-10-CM | POA: Diagnosis not present

## 2016-02-29 DIAGNOSIS — D72819 Decreased white blood cell count, unspecified: Secondary | ICD-10-CM | POA: Diagnosis present

## 2016-02-29 DIAGNOSIS — Z888 Allergy status to other drugs, medicaments and biological substances status: Secondary | ICD-10-CM | POA: Diagnosis not present

## 2016-02-29 DIAGNOSIS — G610 Guillain-Barre syndrome: Secondary | ICD-10-CM | POA: Diagnosis not present

## 2016-02-29 DIAGNOSIS — Z9181 History of falling: Secondary | ICD-10-CM

## 2016-02-29 DIAGNOSIS — M6282 Rhabdomyolysis: Secondary | ICD-10-CM | POA: Diagnosis not present

## 2016-02-29 DIAGNOSIS — R000 Tachycardia, unspecified: Secondary | ICD-10-CM | POA: Diagnosis present

## 2016-02-29 DIAGNOSIS — R059 Cough, unspecified: Secondary | ICD-10-CM

## 2016-02-29 DIAGNOSIS — R740 Nonspecific elevation of levels of transaminase and lactic acid dehydrogenase [LDH]: Secondary | ICD-10-CM | POA: Diagnosis present

## 2016-02-29 DIAGNOSIS — J441 Chronic obstructive pulmonary disease with (acute) exacerbation: Secondary | ICD-10-CM | POA: Diagnosis present

## 2016-02-29 DIAGNOSIS — J13 Pneumonia due to Streptococcus pneumoniae: Secondary | ICD-10-CM

## 2016-02-29 DIAGNOSIS — T796XXA Traumatic ischemia of muscle, initial encounter: Secondary | ICD-10-CM | POA: Diagnosis present

## 2016-02-29 DIAGNOSIS — Z87891 Personal history of nicotine dependence: Secondary | ICD-10-CM | POA: Diagnosis not present

## 2016-02-29 DIAGNOSIS — Z79899 Other long term (current) drug therapy: Secondary | ICD-10-CM

## 2016-02-29 DIAGNOSIS — W19XXXA Unspecified fall, initial encounter: Secondary | ICD-10-CM | POA: Diagnosis not present

## 2016-02-29 DIAGNOSIS — F3132 Bipolar disorder, current episode depressed, moderate: Secondary | ICD-10-CM | POA: Diagnosis not present

## 2016-02-29 DIAGNOSIS — R1310 Dysphagia, unspecified: Secondary | ICD-10-CM | POA: Diagnosis not present

## 2016-02-29 DIAGNOSIS — Z96652 Presence of left artificial knee joint: Secondary | ICD-10-CM | POA: Diagnosis present

## 2016-02-29 DIAGNOSIS — D509 Iron deficiency anemia, unspecified: Secondary | ICD-10-CM | POA: Diagnosis present

## 2016-02-29 DIAGNOSIS — J181 Lobar pneumonia, unspecified organism: Secondary | ICD-10-CM

## 2016-02-29 DIAGNOSIS — M19041 Primary osteoarthritis, right hand: Secondary | ICD-10-CM | POA: Diagnosis present

## 2016-02-29 DIAGNOSIS — R6521 Severe sepsis with septic shock: Secondary | ICD-10-CM | POA: Diagnosis present

## 2016-02-29 DIAGNOSIS — F314 Bipolar disorder, current episode depressed, severe, without psychotic features: Secondary | ICD-10-CM | POA: Diagnosis present

## 2016-02-29 DIAGNOSIS — Z8249 Family history of ischemic heart disease and other diseases of the circulatory system: Secondary | ICD-10-CM

## 2016-02-29 DIAGNOSIS — N200 Calculus of kidney: Secondary | ICD-10-CM | POA: Diagnosis not present

## 2016-02-29 DIAGNOSIS — Y92002 Bathroom of unspecified non-institutional (private) residence single-family (private) house as the place of occurrence of the external cause: Secondary | ICD-10-CM

## 2016-02-29 DIAGNOSIS — R05 Cough: Secondary | ICD-10-CM

## 2016-02-29 DIAGNOSIS — J9600 Acute respiratory failure, unspecified whether with hypoxia or hypercapnia: Secondary | ICD-10-CM

## 2016-02-29 DIAGNOSIS — J9601 Acute respiratory failure with hypoxia: Secondary | ICD-10-CM | POA: Diagnosis present

## 2016-02-29 DIAGNOSIS — R339 Retention of urine, unspecified: Secondary | ICD-10-CM | POA: Diagnosis present

## 2016-02-29 DIAGNOSIS — R6889 Other general symptoms and signs: Secondary | ICD-10-CM | POA: Diagnosis not present

## 2016-02-29 DIAGNOSIS — M19042 Primary osteoarthritis, left hand: Secondary | ICD-10-CM | POA: Diagnosis present

## 2016-02-29 DIAGNOSIS — R531 Weakness: Secondary | ICD-10-CM

## 2016-02-29 DIAGNOSIS — W0110XA Fall on same level from slipping, tripping and stumbling with subsequent striking against unspecified object, initial encounter: Secondary | ICD-10-CM | POA: Diagnosis present

## 2016-02-29 DIAGNOSIS — J440 Chronic obstructive pulmonary disease with acute lower respiratory infection: Secondary | ICD-10-CM | POA: Diagnosis present

## 2016-02-29 DIAGNOSIS — R262 Difficulty in walking, not elsewhere classified: Secondary | ICD-10-CM

## 2016-02-29 DIAGNOSIS — E872 Acidosis: Secondary | ICD-10-CM | POA: Diagnosis not present

## 2016-02-29 DIAGNOSIS — R7989 Other specified abnormal findings of blood chemistry: Secondary | ICD-10-CM

## 2016-02-29 DIAGNOSIS — N179 Acute kidney failure, unspecified: Secondary | ICD-10-CM | POA: Diagnosis present

## 2016-02-29 DIAGNOSIS — J189 Pneumonia, unspecified organism: Secondary | ICD-10-CM

## 2016-02-29 DIAGNOSIS — M60869 Other myositis, unspecified lower leg: Secondary | ICD-10-CM

## 2016-02-29 DIAGNOSIS — S199XXA Unspecified injury of neck, initial encounter: Secondary | ICD-10-CM | POA: Diagnosis not present

## 2016-02-29 DIAGNOSIS — R0603 Acute respiratory distress: Secondary | ICD-10-CM | POA: Diagnosis not present

## 2016-02-29 DIAGNOSIS — R9431 Abnormal electrocardiogram [ECG] [EKG]: Secondary | ICD-10-CM | POA: Diagnosis present

## 2016-02-29 LAB — GLUCOSE, CAPILLARY: GLUCOSE-CAPILLARY: 111 mg/dL — AB (ref 65–99)

## 2016-02-29 LAB — URINALYSIS, COMPLETE (UACMP) WITH MICROSCOPIC
BACTERIA UA: NONE SEEN
BILIRUBIN URINE: NEGATIVE
GLUCOSE, UA: NEGATIVE mg/dL
Ketones, ur: NEGATIVE mg/dL
LEUKOCYTES UA: NEGATIVE
NITRITE: NEGATIVE
PH: 6 (ref 5.0–8.0)
Protein, ur: 100 mg/dL — AB
SPECIFIC GRAVITY, URINE: 1.02 (ref 1.005–1.030)
Squamous Epithelial / LPF: NONE SEEN

## 2016-02-29 LAB — CBC WITH DIFFERENTIAL/PLATELET
BASOS ABS: 0 10*3/uL (ref 0–0.1)
Basophils Relative: 0 %
EOS ABS: 0 10*3/uL (ref 0–0.7)
Eosinophils Relative: 0 %
HCT: 40 % (ref 40.0–52.0)
HEMOGLOBIN: 13.7 g/dL (ref 13.0–18.0)
LYMPHS ABS: 0.1 10*3/uL — AB (ref 1.0–3.6)
LYMPHS PCT: 2 %
MCH: 33.1 pg (ref 26.0–34.0)
MCHC: 34.4 g/dL (ref 32.0–36.0)
MCV: 96.5 fL (ref 80.0–100.0)
Monocytes Absolute: 0 10*3/uL — ABNORMAL LOW (ref 0.2–1.0)
Monocytes Relative: 1 %
NEUTROS PCT: 97 %
Neutro Abs: 3.3 10*3/uL (ref 1.4–6.5)
Platelets: 131 10*3/uL — ABNORMAL LOW (ref 150–440)
RBC: 4.15 MIL/uL — AB (ref 4.40–5.90)
RDW: 14.1 % (ref 11.5–14.5)
WBC: 3.4 10*3/uL — AB (ref 3.8–10.6)

## 2016-02-29 LAB — COMPREHENSIVE METABOLIC PANEL
ALT: 28 U/L (ref 17–63)
ANION GAP: 7 (ref 5–15)
AST: 63 U/L — ABNORMAL HIGH (ref 15–41)
Albumin: 3.6 g/dL (ref 3.5–5.0)
Alkaline Phosphatase: 52 U/L (ref 38–126)
BUN: 33 mg/dL — ABNORMAL HIGH (ref 6–20)
CALCIUM: 9.8 mg/dL (ref 8.9–10.3)
CHLORIDE: 104 mmol/L (ref 101–111)
CO2: 26 mmol/L (ref 22–32)
Creatinine, Ser: 1.49 mg/dL — ABNORMAL HIGH (ref 0.61–1.24)
GFR calc non Af Amer: 44 mL/min — ABNORMAL LOW (ref >60)
GFR, EST AFRICAN AMERICAN: 50 mL/min — AB (ref >60)
Glucose, Bld: 109 mg/dL — ABNORMAL HIGH (ref 65–99)
Potassium: 4 mmol/L (ref 3.5–5.1)
SODIUM: 137 mmol/L (ref 135–145)
Total Bilirubin: 1.2 mg/dL (ref 0.3–1.2)
Total Protein: 6.6 g/dL (ref 6.5–8.1)

## 2016-02-29 LAB — PROCALCITONIN: Procalcitonin: 15.22 ng/mL

## 2016-02-29 LAB — URINE DRUG SCREEN, QUALITATIVE (ARMC ONLY)
Amphetamines, Ur Screen: NOT DETECTED
BARBITURATES, UR SCREEN: NOT DETECTED
BENZODIAZEPINE, UR SCRN: NOT DETECTED
CANNABINOID 50 NG, UR ~~LOC~~: NOT DETECTED
COCAINE METABOLITE, UR ~~LOC~~: NOT DETECTED
MDMA (Ecstasy)Ur Screen: NOT DETECTED
METHADONE SCREEN, URINE: NOT DETECTED
OPIATE, UR SCREEN: NOT DETECTED
PHENCYCLIDINE (PCP) UR S: NOT DETECTED
Tricyclic, Ur Screen: NOT DETECTED

## 2016-02-29 LAB — SEDIMENTATION RATE: Sed Rate: 49 mm/hr — ABNORMAL HIGH (ref 0–20)

## 2016-02-29 LAB — MRSA PCR SCREENING: MRSA BY PCR: NEGATIVE

## 2016-02-29 LAB — RAPID INFLUENZA A&B ANTIGENS
Influenza A (ARMC): NEGATIVE
Influenza B (ARMC): NEGATIVE

## 2016-02-29 LAB — LACTIC ACID, PLASMA
LACTIC ACID, VENOUS: 2.1 mmol/L — AB (ref 0.5–1.9)
Lactic Acid, Venous: 3.4 mmol/L (ref 0.5–1.9)
Lactic Acid, Venous: 1.4 mmol/L (ref 0.5–1.9)

## 2016-02-29 LAB — LITHIUM LEVEL: Lithium Lvl: <0.06 mmol/L — ABNORMAL LOW (ref 0.60–1.20)

## 2016-02-29 LAB — TROPONIN I: Troponin I: <0.03 ng/mL (ref <0.03)

## 2016-02-29 LAB — CK: Total CK: 1646 U/L — ABNORMAL HIGH (ref 49–397)

## 2016-02-29 MED ORDER — METHYLPREDNISOLONE SODIUM SUCC 125 MG IJ SOLR: 60.0000 mg | Freq: Four times a day (QID) | INTRAMUSCULAR | Status: DC

## 2016-02-29 MED ORDER — AZITHROMYCIN 500 MG PO TABS: 500.0000 mg | ORAL_TABLET | Freq: Every day | ORAL | Status: DC

## 2016-02-29 MED ORDER — SODIUM CHLORIDE 0.9 % IV SOLN
INTRAVENOUS | Status: DC
  Administered 2016-02-29: 21:00:00 via INTRAVENOUS

## 2016-02-29 MED ORDER — ADULT MULTIVITAMIN W/MINERALS CH: 1.0000 | ORAL_TABLET | Freq: Every day | ORAL | Status: DC

## 2016-02-29 MED ORDER — METHYLPHENIDATE HCL 5 MG PO TABS
20.0000 mg | ORAL_TABLET | Freq: Two times a day (BID) | ORAL | Status: DC
  Administered 2016-03-01 – 2016-03-03 (×5): 20 mg via ORAL
  Filled 2016-02-29: qty 4
  Filled 2016-02-29 (×4): qty 2

## 2016-02-29 MED ORDER — GUAIFENESIN ER 600 MG PO TB12: 600.0000 mg | ORAL_TABLET | Freq: Two times a day (BID) | ORAL | Status: DC | PRN

## 2016-02-29 MED ORDER — AZITHROMYCIN 250 MG PO TABS: 250.0000 mg | ORAL_TABLET | Freq: Every day | ORAL | Status: DC

## 2016-02-29 MED ORDER — IPRATROPIUM-ALBUTEROL 0.5-2.5 (3) MG/3ML IN SOLN
3.0000 mL | Freq: Four times a day (QID) | RESPIRATORY_TRACT | Status: DC
  Administered 2016-03-01 (×2): 3 mL via RESPIRATORY_TRACT
  Filled 2016-02-29 (×2): qty 3

## 2016-02-29 MED ORDER — ACETAMINOPHEN 500 MG PO TABS
ORAL_TABLET | ORAL | Status: AC
  Administered 2016-02-29: 1000 mg
  Filled 2016-02-29: qty 2

## 2016-02-29 MED ORDER — SODIUM CHLORIDE 0.9 % IV BOLUS (SEPSIS)
1000.0000 mL | Freq: Once | INTRAVENOUS | Status: AC
  Administered 2016-02-29: 1000 mL via INTRAVENOUS

## 2016-02-29 MED ORDER — DOXYCYCLINE HYCLATE 100 MG IV SOLR
100.0000 mg | Freq: Two times a day (BID) | INTRAVENOUS | Status: DC
  Administered 2016-03-01: 100 mg via INTRAVENOUS
  Filled 2016-02-29 (×3): qty 100

## 2016-02-29 MED ORDER — DEXTROSE 50 % IV SOLN
50.0000 mL | Freq: Once | INTRAVENOUS | Status: AC
  Administered 2016-02-29: 50 mL via INTRAVENOUS
  Filled 2016-02-29: qty 50

## 2016-02-29 MED ORDER — METHYLPREDNISOLONE SODIUM SUCC 40 MG IJ SOLR
40.0000 mg | Freq: Two times a day (BID) | INTRAMUSCULAR | Status: DC
  Administered 2016-02-29 – 2016-03-01 (×2): 40 mg via INTRAVENOUS
  Filled 2016-02-29 (×2): qty 1

## 2016-02-29 MED ORDER — GUAIFENESIN 100 MG/5ML PO SOLN
5.0000 mL | ORAL | Status: DC | PRN
  Administered 2016-03-01: 100 mg via ORAL
  Filled 2016-02-29 (×2): qty 5

## 2016-02-29 MED ORDER — LIDOCAINE HCL (PF) 1 % IJ SOLN
5.0000 mL | Freq: Once | INTRAMUSCULAR | Status: DC
  Filled 2016-02-29: qty 5

## 2016-02-29 MED ORDER — VANCOMYCIN HCL IN DEXTROSE 1-5 GM/200ML-% IV SOLN
1000.0000 mg | Freq: Once | INTRAVENOUS | Status: AC
  Administered 2016-02-29: 1000 mg via INTRAVENOUS
  Filled 2016-02-29: qty 200

## 2016-02-29 MED ORDER — ACETAMINOPHEN 500 MG PO TABS
500.0000 mg | ORAL_TABLET | Freq: Three times a day (TID) | ORAL | Status: DC | PRN
  Administered 2016-03-01: 500 mg via ORAL
  Filled 2016-02-29: qty 1

## 2016-02-29 MED ORDER — PIPERACILLIN-TAZOBACTAM 3.375 G IVPB 30 MIN
3.3750 g | Freq: Once | INTRAVENOUS | Status: AC
  Administered 2016-02-29: 3.375 g via INTRAVENOUS
  Filled 2016-02-29: qty 50

## 2016-02-29 MED ORDER — OCUVITE-LUTEIN PO CAPS: 1.0000 | ORAL_CAPSULE | Freq: Every day | ORAL | Status: DC

## 2016-02-29 MED ORDER — CEFTRIAXONE SODIUM-DEXTROSE 1-3.74 GM-% IV SOLR
1.0000 g | INTRAVENOUS | Status: DC
  Administered 2016-02-29 – 2016-03-02 (×3): 1 g via INTRAVENOUS
  Filled 2016-02-29 (×4): qty 50

## 2016-02-29 MED ORDER — DEXTROSE 5 % IV SOLN: 1.0000 g | INTRAVENOUS | Status: DC

## 2016-02-29 NOTE — ED Provider Notes (Signed)
[facility] Emergency Department Provider Note   ____________________________________________   I have reviewed the triage vital signs and the nursing notes.   HISTORY  Chief Complaint Fall and Weakness   History limited by: Not Limited   HPI David [name] is a 76 y.o. male who presents to the emergency department today because of a fall and weakness. The patient states that he has been feeling progressively weaker for the past few days. This morning when he tried to get out of bed he required help. His family member was able to help him to the restroom. Once in the restroom he did fall and struck the back of his head. The patient did not have any loss of consciousness. The patient states that for the past few days he has also had some cough. Per family his appetite has been poor for the past year and they are not sure if he is staying hydrated.    Past Medical History:  Diagnosis Date  . Allergic rhinitis   . Anemia   . Bipolar disorder ([place])   . Elevated PSA    had biopsy at [facility] around 2009-2010 negative for malignancy  . Erectile dysfunction   . GERD (gastroesophageal reflux disease)   . Leucopenia   . Osteoarthritis    in hands  . Prostatitis   . Sigmoid polyp     Patient Active Problem List   Diagnosis Date Noted  . IDA (iron deficiency anemia) 06/19/2014  . Leucopenia 06/19/2014    Past Surgical History:  Procedure Laterality Date  . COLONOSCOPY  10/20/12    Prior to Admission medications   Medication Sig Start Date End Date Taking? Authorizing Provider  acetaminophen (TYLENOL) 500 MG tablet Take 500 mg by mouth. 11/28/14   Historical Provider, MD  amoxicillin (AMOXIL) 500 MG capsule TAKE 4 CAPSULES BY MOUTH 1 HOUR PROIR TO TREATMENT 05/01/15   Historical Provider, MD  Cholecalciferol (D 1000) 1000 units capsule Take by mouth.    Historical Provider, MD  lithium carbonate (LITHOBID) 300 MG CR tablet Take 300 mg by mouth. 05/21/15 06/20/15   Historical Provider, MD  lithium carbonate 150 MG capsule Take 150 mg by mouth. 05/21/15 08/19/15  Historical Provider, MD  Lutein-Zeaxanthin 25-5 MG CAPS Take by mouth daily.    Historical Provider, MD  methylphenidate 36 MG PO CR tablet Take 36 mg by mouth. 05/21/15   Historical Provider, MD  Multiple Vitamin (MULTIVITAMIN) tablet Take 1 tablet by mouth daily.    Historical Provider, MD    Allergies No known allergies and Quetiapine  No family history on file.  Social History Social History  Substance Use Topics  . Smoking status: Former Smoker    Types: Cigarettes    Quit date: 02/22/1975  . Smokeless tobacco: Never Used  . Alcohol use No    Review of Systems  Constitutional: Positive for chills. Cardiovascular: Negative for chest pain. Respiratory: Positive for cough, shortness of breath. Gastrointestinal: Negative for abdominal pain, vomiting and diarrhea. Genitourinary: Negative for dysuria. Musculoskeletal: Negative for back pain. Skin: Laceration to back of head. Neurological: Negative for headaches, focal weakness or numbness.  10-point ROS otherwise negative.  ____________________________________________   PHYSICAL EXAM:  VITAL SIGNS: ED Triage Vitals [02/29/16 1418]  Enc Vitals Group     BP (!) 80/43     Pulse Rate (!) 123     Resp (!) 22     Temp (!) 102.6 F (39.2 C)     Temp Source  Oral     SpO2 94 %     Weight 140 lb (63.5 kg)     Height 5\' 9"  (1.753 m)   Constitutional: Alert and oriented. Well appearing and in no distress. Eyes: Conjunctivae are normal. Normal extraocular movements. ENT   Head: Normocephalic and atraumatic.   Nose: No congestion/rhinnorhea.   Mouth/Throat: Mucous membranes are moist.   Neck: No stridor. Hematological/Lymphatic/Immunilogical: No cervical lymphadenopathy. Cardiovascular: Tachycardic, regular rhythm.  No murmurs, rubs, or gallops.  Respiratory: Normal respiratory effort without tachypnea nor  retractions. Breath sounds are clear and equal bilaterally. No wheezes/rales/rhonchi. Gastrointestinal: Soft and non tender. No rebound. No guarding.  Genitourinary: Deferred Musculoskeletal: Normal range of motion in all extremities. No lower extremity edema. Neurologic:  Normal speech and language. No gross focal neurologic deficits are appreciated.  Skin:  Skin is warm, dry. Roughly 1-1/2 cm laceration to the patient's occiput. Psychiatric: Mood and affect are normal. Speech and behavior are normal. Patient exhibits appropriate insight and judgment.  ____________________________________________    LABS (pertinent positives/negatives)  Labs Reviewed  LACTIC ACID, PLASMA - Abnormal; Notable for the following:       Result Value   Lactic Acid, Venous 3.4 (*)    All other components within normal limits  COMPREHENSIVE METABOLIC PANEL - Abnormal; Notable for the following:    Glucose, Bld 109 (*)    BUN 33 (*)    Creatinine, Ser 1.49 (*)    AST 63 (*)    GFR calc non Af Amer 44 (*)    GFR calc Af Amer 50 (*)    All other components within normal limits  CBC WITH DIFFERENTIAL/PLATELET - Abnormal; Notable for the following:    WBC 3.4 (*)    RBC 4.15 (*)    Platelets 131 (*)    Lymphs Abs 0.1 (*)    Monocytes Absolute 0.0 (*)    All other components within normal limits  URINALYSIS, COMPLETE (UACMP) WITH MICROSCOPIC - Abnormal; Notable for the following:    Color, Urine YELLOW (*)    APPearance CLEAR (*)    Hgb urine dipstick LARGE (*)    Protein, ur 100 (*)    All other components within normal limits  RAPID INFLUENZA A&B ANTIGENS ([facility] ONLY)  CULTURE, BLOOD (ROUTINE X 2)  CULTURE, BLOOD (ROUTINE X 2)  URINE CULTURE  TROPONIN I  LACTIC ACID, PLASMA     ____________________________________________   EKG  I, Nance Pear, attending physician, personally viewed and interpreted this EKG  EKG Time: 1600 Rate: 112 Rhythm: sinus tachycarida Axis: normal Intervals:  qtc 409 QRS: narrow ST changes: no st elevation Impression: abnormal ekg   ____________________________________________    RADIOLOGY  CXR IMPRESSION:  No active disease.   CT head IMPRESSION:  No acute intracranial abnormality.     ____________________________________________   PROCEDURES  Procedures  LACERATION REPAIR Performed by: Nance Pear Authorized by: Nance Pear Consent: Verbal consent obtained. Risks and benefits: risks, benefits and alternatives were discussed Consent given by: patient Patient identity confirmed: provided demographic data Prepped and Draped in normal sterile fashion Wound explored  Laceration Location: occipital scalp  Laceration Length: 1.5 cm  No Foreign Bodies seen or palpated  Anesthesia: local infiltration  Local anesthetic: lidocaine 1% without epinephrine  Anesthetic total: 2 ml  Irrigation method: syringe Amount of cleaning: standard  Skin closure: staples  Number of staples: 2  Technique: staples  Patient tolerance: Patient tolerated the procedure well with no immediate complications.  CRITICAL CARE Performed  by: Kimberlea Schlag   Total critical care time: 40 minutes  Critical care time was exclusive of separately billable procedures and treating other patients.  Critical care was necessary to treat or prevent imminent or life-threatening deterioration.  Critical care was time spent personally by me on the following activities: development of treatment plan with patient and/or surrogate as well as nursing, discussions with consultants, evaluation of patient's response to treatment, examination of patient, obtaining history from patient or surrogate, ordering and performing treatments and interventions, ordering and review of laboratory studies, ordering and review of radiographic studies, pulse oximetry and re-evaluation of patient's  condition.   ____________________________________________   INITIAL IMPRESSION / ASSESSMENT AND PLAN / ED COURSE  Pertinent labs & imaging results that were available during my care of the patient were reviewed by me and considered in my medical decision making (see chart for details).  Patient presented to the emergency department today because of concerns for weakness. Initial vital signs here concerning for sepsis. Patient was tachycardic and febrile. Sepsis order set was utilized. Patient was given IV bolus given somewhat soft blood pressures. Additionally he was written for multiple broad-spectrum IV antibiotics. Furthermore the patient underwent a head CT given the fact that because of the weakness he fell. The patient did suffer roughly 1.5 cm laceration to his occiput. This was stapled closed. Patient tolerated the procedure well.  Repeat lactic acid level has improved although still slightly elevated. ____________________________________________   FINAL CLINICAL IMPRESSION(S) / ED DIAGNOSES  Final diagnoses:  Fever, unspecified fever cause  Elevated lactic acid level    Note: This dictation was prepared with Dragon dictation. Any transcriptional errors that result from this process are unintentional     Nance Pear, MD 02/29/16 [phone]

## 2016-02-29 NOTE — ED Triage Notes (Signed)
Brought in via ems from home  S/p fall per ems  Fell in bathroom  Hit head on tub  No loc    also has had cough and congestion   o2 sat were 92% on RA at home placed on 02 at 3 liters   sats increased to 96%

## 2016-02-29 NOTE — H&P (Signed)
[place] [facility] [place] NAME: David [name]    MR#:  [id number]  DATE OF BIRTH:  1940-12-31  DATE OF ADMISSION:  02/29/2016  PRIMARY CARE PHYSICIAN: Ezequiel Kayser, MD   REQUESTING/REFERRING PHYSICIAN: Evern Bio  CHIEF COMPLAINT:   Chief Complaint  Patient presents with  . Fall  . Weakness    HISTORY OF PRESENT ILLNESS:  David [name]  is a 76 y.o. male with a known history of Bipolar disorder presents with severe weakness all extremities and he could hardly control his arms or legs today. He had total body weakness. He complains of a sore throat some cough and fever. Today he had a fall and hit his head. In the ER he had a fever of 102 and he felt very weak.  PAST MEDICAL HISTORY:   Past Medical History:  Diagnosis Date  . Allergic rhinitis   . Anemia   . Bipolar disorder ([place])   . Elevated PSA    had biopsy at [facility] around 2009-2010 negative for malignancy  . Erectile dysfunction   . GERD (gastroesophageal reflux disease)   . Leucopenia   . Osteoarthritis    in hands  . Prostatitis   . Sigmoid polyp     PAST SURGICAL HISTORY:   Past Surgical History:  Procedure Laterality Date  . COLONOSCOPY  10/20/12  . HERNIA REPAIR    . Left knee replacement    . Right shoulder replacement      SOCIAL HISTORY:   Social History  Substance Use Topics  . Smoking status: Former Smoker    Types: Cigarettes    Quit date: 02/22/1975  . Smokeless tobacco: Never Used  . Alcohol use No    FAMILY HISTORY:   Family History  Problem Relation Age of Onset  . CVA Mother   . CAD Father     DRUG ALLERGIES:   Allergies  Allergen Reactions  . Quetiapine Anxiety    Restlessness, worsening anxiety  Restlessness, worsening anxiety     REVIEW OF SYSTEMS:  CONSTITUTIONAL: Positive for fever, fatigue and weakness.  EYES: No blurred or double vision. Wears glasses EARS, NOSE, AND THROAT: No tinnitus or ear pain. Positive for runny  nose and sore throat RESPIRATORY: Positive for cough, shortness of breath. No wheezing or hemoptysis.  CARDIOVASCULAR: No chest pain, orthopnea, edema.  GASTROINTESTINAL: No nausea, vomiting, diarrhea or abdominal pain. No blood in bowel movements. Positive for constipation GENITOURINARY: No dysuria, hematuria.  ENDOCRINE: No polyuria, nocturia,  HEMATOLOGY: No anemia, easy bruising or bleeding SKIN: No rash or lesion. MUSCULOSKELETAL: Positive for joint pain and pain in his upper legs  NEUROLOGIC: No tingling, numbness, weakness.  PSYCHIATRY: No anxiety or depression.   MEDICATIONS AT HOME:   Prior to Admission medications   Medication Sig Start Date End Date Taking? Authorizing Provider  acetaminophen (TYLENOL) 500 MG tablet Take 500 mg by mouth every 8 (eight) hours as needed for moderate pain.    Yes Historical Provider, MD  methylphenidate (RITALIN) 20 MG tablet Take 20 mg by mouth 2 (two) times daily.   Yes Historical Provider, MD  Multiple Vitamin (MULTIVITAMIN) tablet Take 1 tablet by mouth daily.   Yes Historical Provider, MD  multivitamin-lutein (OCUVITE-LUTEIN) CAPS capsule Take 1 capsule by mouth daily.   Yes Historical Provider, MD      VITAL SIGNS:  Blood pressure (!) 97/52, pulse 96, temperature (!) 102.6 F (39.2 C), temperature source Oral, resp. rate 18, height 5\' 9"  (  1.753 m), weight 63.5 kg (140 lb), SpO2 92 %.  PHYSICAL EXAMINATION:  GENERAL:  76 y.o.-year-old patient lying in the bed with no acute distress.  EYES: Pupils equal, round, reactive to light and accommodation. No scleral icterus. Extraocular muscles intact.  HEENT: Head atraumatic, normocephalic. Oropharynx and nasopharynx clear.  NECK:  Supple, no jugular venous distention. No thyroid enlargement, no tenderness.  LUNGS: Decreased breath sounds bilaterally, positive wheezing. No rales,rhonchi or crepitation. No use of accessory muscles of respiration.  CARDIOVASCULAR: S1, S2 normal. No murmurs, rubs,  or gallops.  ABDOMEN: Soft, nontender, nondistended. Bowel sounds present. No organomegaly or mass.  EXTREMITIES: No pedal edema, cyanosis, or clubbing.  NEUROLOGIC: Cranial nerves II through XII are intact. Muscle strength 4/5 in all extremities. Sensation intact. Gait not checked.  PSYCHIATRIC: The patient is alert and oriented x 3.  SKIN: No rash, lesion, or ulcer.   LABORATORY PANEL:   CBC  Recent Labs Lab 02/29/16 1439  WBC 3.4*  HGB 13.7  HCT 40.0  PLT 131*   ------------------------------------------------------------------------------------------------------------------  Chemistries   Recent Labs Lab 02/29/16 1439  NA 137  K 4.0  CL 104  CO2 26  GLUCOSE 109*  BUN 33*  CREATININE 1.49*  CALCIUM 9.8  AST 63*  ALT 28  ALKPHOS 52  BILITOT 1.2   ------------------------------------------------------------------------------------------------------------------  Cardiac Enzymes  Recent Labs Lab 02/29/16 1439  TROPONINI <0.03   ------------------------------------------------------------------------------------------------------------------  RADIOLOGY:  Ct Head Wo Contrast  Result Date: 02/29/2016 CLINICAL DATA:  Fall, hit back of head. EXAM: CT HEAD WITHOUT CONTRAST TECHNIQUE: Contiguous axial images were obtained from the base of the skull through the vertex without intravenous contrast. COMPARISON:  MRI 12/26/2013 FINDINGS: Brain: No acute intracranial abnormality. Specifically, no hemorrhage, hydrocephalus, mass lesion, acute infarction, or significant intracranial injury. Vascular: No hyperdense vessel or unexpected calcification. Skull: No acute calvarial abnormality. Sinuses/Orbits: Visualized paranasal sinuses and mastoids clear. Orbital soft tissues unremarkable. Other: None IMPRESSION: No acute intracranial abnormality. Electronically Signed   By: Rolm Baptise M.D.   On: 02/29/2016 15:02   Dg Chest Port 1 View  Result Date: 02/29/2016 CLINICAL DATA:   Status post fall, weakness EXAM: PORTABLE CHEST 1 VIEW COMPARISON:  07/16/2009 FINDINGS: There is no focal parenchymal opacity. There is no pleural effusion or pneumothorax. The heart and mediastinal contours are unremarkable. There is a right shoulder arthroplasty. There are degenerative changes of bilateral acromioclavicular joints. IMPRESSION: No active disease. Electronically Signed   By: Kathreen Devoid   On: 02/29/2016 15:10    EKG:   Sinus tachycardia 112 bpm, left atrial enlargement, right axis deviation. old  septal infarct  IMPRESSION AND PLAN:   1. Fever and severe weakness. Influenza swab negative. We'll send off a respiratory panel. Will admit to the CCU. Get neurology and critical care specialist evaluations for possible Guillain-Barr. Add on a CPK. Empiric Rocephin and doxycycline for respiratory issues. Hydrate and repeat a chest x-ray tomorrow. Since the patient had a fall I will get a CT scan of the cervical spine. 2. Hypotension and lactic acidosis give IV fluid bolus and continue to monitor closely in the CCU stepdown 3. Bipolar disorder. Patient on Ritalin 4. Leukopenia and thrombocytopenia. Looking back at lab seems to be chronic 5. Since the patient had a lithium level drawn back in December I'll draw another lithium level and then see. 6. COPD exacerbation start IV signed Medrol and continue antibiotics.  All the records are reviewed and case discussed with ED provider. Management plans discussed  with the patient, family and they are in agreement.  CODE STATUS: Full code  TOTAL TIME TAKING CARE OF THIS PATIENT: 55 minutes.    Loletha Grayer M.D on 02/29/2016 at 6:29 PM  Between 7am to 6pm - Pager - [phone]  After 6pm call admission pager [phone]  [employer] [facility] [facility]  [phone]  CC: Primary care physician; Ezequiel Kayser, MD

## 2016-02-29 NOTE — Consult Note (Signed)
Name: David [name] MRN: [id number] DOB: [birth year]    ADMISSION DATE:  02/29/2016 CONSULTATION DATE:  02/29/2016  REFERRING MD :  Dr. Leslye Peer   CHIEF COMPLAINT:  Fall and Weakness   BRIEF PATIENT DESCRIPTION: This is a 76 yo male who presented to [facility] ER 01/8 from home with generalized weakness and following a fall at home at which he hit his head  SIGNIFICANT EVENTS  01/8-Pt admitted to [facility] with hypotension, fevers, and severe weakness PCCM consulted for additional management   STUDIES:  CT Head 01/8>>No acute intracranial abnormality CT Cervical Spine 01/08>>Straightening of the cervical spine with severe degenerative disc changes from C4 through C7 ; multilevel bilateral foraminal stenosis and mild to moderate canal stenosis. No acute osseous abnormality.  HISTORY OF PRESENT ILLNESS:   This is a 76 yo male with a PMH of Prostatitis, Elevated PSA, Osteoarthritis, Leukopenia, GERD, Bipolar disorder, Anemia, and Allergic rhinitis.  He presented to [facility] ER 01/8 with severe generalized weakness onset of symptoms several days ago and c/o an inability to control his arms and legs on 01/7. This morning when he attempted to get out of bed he required help from his family member ambulating to the restroom, which he normally can do independently. Once in the restroom he fell and hit the back of his head.  He did not lose consciousness following the fall.  He does endorse a cough, dysphagia, fever, and sore throat.  He has had a decreased appetite for the past year.  He denies recent travel, numbness or tinging.  He states his wife had the flu last week. In the ER he was febrile with a temp of 102 degrees F with weakness and hypotension.  PCCM consulted 01/8 for management of septic shock.  PAST MEDICAL HISTORY :   has a past medical history of Allergic rhinitis; Anemia; Bipolar disorder ([place]); Elevated PSA; Erectile dysfunction; GERD (gastroesophageal reflux disease); Leucopenia;  Osteoarthritis; Prostatitis; and Sigmoid polyp.  has a past surgical history that includes Colonoscopy (10/20/12); Left knee replacement; Right shoulder replacement; and Hernia repair. Prior to Admission medications   Medication Sig Start Date End Date Taking? Authorizing Provider  acetaminophen (TYLENOL) 500 MG tablet Take 500 mg by mouth every 8 (eight) hours as needed for moderate pain.    Yes Historical Provider, MD  methylphenidate (RITALIN) 20 MG tablet Take 20 mg by mouth 2 (two) times daily.   Yes Historical Provider, MD  Multiple Vitamin (MULTIVITAMIN) tablet Take 1 tablet by mouth daily.   Yes Historical Provider, MD  multivitamin-lutein (OCUVITE-LUTEIN) CAPS capsule Take 1 capsule by mouth daily.   Yes Historical Provider, MD   Allergies  Allergen Reactions  . Quetiapine Anxiety    Restlessness, worsening anxiety  Restlessness, worsening anxiety     FAMILY HISTORY:  family history includes CAD in his father; CVA in his mother. SOCIAL HISTORY:  reports that he quit smoking about 41 years ago. His smoking use included Cigarettes. He has never used smokeless tobacco. He reports that he does not drink alcohol.  REVIEW OF SYSTEMS:  Positives in BOLD  Constitutional: fever, chills, weight loss, malaise/fatigue and diaphoresis.  HENT: hearing loss, ear pain, nosebleeds, congestion, sore throat, neck pain, tinnitus and ear discharge.   Eyes: Negative for blurred vision, double vision, photophobia, pain, discharge and redness.  Respiratory: cough, hemoptysis, sputum production, shortness of breath, wheezing and stridor.   Cardiovascular: Negative for chest pain, palpitations, orthopnea, claudication, leg swelling and PND.  Gastrointestinal: Negative  for heartburn, nausea, vomiting, abdominal pain, diarrhea, constipation, blood in stool and melena.  Genitourinary: Negative for dysuria, urgency, frequency, hematuria and flank pain.  Musculoskeletal: myalgias, back pain, joint pain and  falls.  Skin: Negative for itching and rash.  Neurological: dizziness, tingling, tremors, sensory change, speech change, focal weakness, seizures, loss of consciousness, weakness and headaches.  Endo/Heme/Allergies: Negative for environmental allergies and polydipsia. Does not bruise/bleed easily.  SUBJECTIVE: Pt currently resting in bed no complaints at this time   VITAL SIGNS: Temp:  [101.2 F (38.4 C)-102.6 F (39.2 C)] 101.2 F (38.4 C) (01/08 1944) Pulse Rate:  [89-123] 92 (01/08 1930) Resp:  [18-26] 26 (01/08 1930) BP: (80-120)/(43-57) 93/57 (01/08 1930) SpO2:  [92 %-94 %] 93 % (01/08 1930) Weight:  [63.5 kg (140 lb)] 63.5 kg (140 lb) (01/08 1418)  PHYSICAL EXAMINATION: General:  Well developed, well nourished Caucasian male Neuro:  Alert and oriented, follows commands, PERRLA, hand grip strength 4/5 bilateral upper extremities, lower extremity strength 5/5 bilaterally HEENT:  Supple, no JVD Cardiovascular:  NSR, s1s2, no M/R/G Lungs: rhonchi throughout, even, non labored Abdomen:  +BS x4, soft, non tender, non distended Musculoskeletal:  Normal bulk and tone no edema Skin:  1.5 cm occipital scalp laceration staple intact clean and dry    Recent Labs Lab 02/29/16 1439  NA 137  K 4.0  CL 104  CO2 26  BUN 33*  CREATININE 1.49*  GLUCOSE 109*    Recent Labs Lab 02/29/16 1439  HGB 13.7  HCT 40.0  WBC 3.4*  PLT 131*   Ct Head Wo Contrast  Result Date: 02/29/2016 CLINICAL DATA:  Fall, hit back of head. EXAM: CT HEAD WITHOUT CONTRAST TECHNIQUE: Contiguous axial images were obtained from the base of the skull through the vertex without intravenous contrast. COMPARISON:  MRI 12/26/2013 FINDINGS: Brain: No acute intracranial abnormality. Specifically, no hemorrhage, hydrocephalus, mass lesion, acute infarction, or significant intracranial injury. Vascular: No hyperdense vessel or unexpected calcification. Skull: No acute calvarial abnormality. Sinuses/Orbits: Visualized  paranasal sinuses and mastoids clear. Orbital soft tissues unremarkable. Other: None IMPRESSION: No acute intracranial abnormality. Electronically Signed   By: Rolm Baptise M.D.   On: 02/29/2016 15:02   Ct Cervical Spine Wo Contrast  Result Date: 02/29/2016 CLINICAL DATA:  Fall earlier today EXAM: CT CERVICAL SPINE WITHOUT CONTRAST TECHNIQUE: Multidetector CT imaging of the cervical spine was performed without intravenous contrast. Multiplanar CT image reconstructions were also generated. COMPARISON:  Head CT 02/29/2016 FINDINGS: Alignment: Straightening of the cervical spine. No subluxation. Facet alignment normal. Skull base and vertebrae: Craniovertebral junction is intact. Vertebral body heights are maintained. There is no fracture. Soft tissues and spinal canal: No prevertebral fluid or swelling. No visible canal hematoma. Disc levels: Severe degenerative disc changes at C3-C4, C4-C5, C5-C6 and C6-C7. Posterior disc osteophyte complex C3-C4, C4-C5, C5-C6 and C6-C7 results in mild to moderate canal stenosis. Multilevel bilateral foraminal stenosis as well. Upper chest: Left apical scarring or atelectasis. Image thyroid within normal limits. Other: None IMPRESSION: Straightening of the cervical spine with severe degenerative disc changes from C4 through C7 ; multilevel bilateral foraminal stenosis and mild to moderate canal stenosis. No acute osseous abnormality. Electronically Signed   By: Donavan Foil M.D.   On: 02/29/2016 18:55   Dg Chest Port 1 View  Result Date: 02/29/2016 CLINICAL DATA:  Status post fall, weakness EXAM: PORTABLE CHEST 1 VIEW COMPARISON:  07/16/2009 FINDINGS: There is no focal parenchymal opacity. There is no pleural effusion or pneumothorax. The heart  and mediastinal contours are unremarkable. There is a right shoulder arthroplasty. There are degenerative changes of bilateral acromioclavicular joints. IMPRESSION: No active disease. Electronically Signed   By: Kathreen Devoid   On:  02/29/2016 15:10    ASSESSMENT / PLAN: Hypotension secondary to septic shock  Severe generalized weakness secondary to ?Guillain-Barre Lactic acidosis-improving  P: Supplemental O2 as needed to maintain O2 sats >92% or for dyspnea  Aggressive fluid resuscitation to maintain map >65 if unable to achieve map goal with fluids will start peripheral neosynephrine Wean iv steroids  Trend PCT's and lactic acid Trend WBC's and monitor fever curve Continue empiric abx for now Prn guaifenesin  Follow cultures  Continuous telemetry monitoring Neurology and Speech therapy consulted appreciate input  Aspiration precautions   Marda Stalker, [place] Pager [phone] (please enter 7 digits) PCCM Consult Pager [phone] (please enter 7 digits)   PCCM ATTENDING ATTESTATION:  I have evaluated patient with the APP [place], reviewed database in its entirety and discussed care plan in detail. In addition, this patient was discussed on multidisciplinary rounds.   Important exam findings: No overt distress on [place] O2 BP marginal but not requiring vasopressors Bronchial BS in LLL B thighs tender to palpation  Major problems addressed by PCCM team: CAP, NOS LE weakness due to myositis - likely related to sepsis Profound depression X 1 year   PLAN/REC: Cont abx - ceftriaxone/azithro Monitor CK I think we can hold off on Neurology consultation for now Monitor PCT Psych consultation requested by Dr Caryn Section, MD PCCM service Mobile [phone] Pager [phone]

## 2016-03-01 ENCOUNTER — Inpatient Hospital Stay: Payer: Medicare HMO

## 2016-03-01 DIAGNOSIS — F3132 Bipolar disorder, current episode depressed, moderate: Secondary | ICD-10-CM

## 2016-03-01 LAB — BASIC METABOLIC PANEL
ANION GAP: 8 (ref 5–15)
BUN: 29 mg/dL — ABNORMAL HIGH (ref 6–20)
CO2: 19 mmol/L — AB (ref 22–32)
Calcium: 8.7 mg/dL — ABNORMAL LOW (ref 8.9–10.3)
Chloride: 110 mmol/L (ref 101–111)
Creatinine, Ser: 1.41 mg/dL — ABNORMAL HIGH (ref 0.61–1.24)
GFR calc non Af Amer: 47 mL/min — ABNORMAL LOW (ref >60)
GFR, EST AFRICAN AMERICAN: 54 mL/min — AB (ref >60)
GLUCOSE: 138 mg/dL — AB (ref 65–99)
Potassium: 4.2 mmol/L (ref 3.5–5.1)
Sodium: 137 mmol/L (ref 135–145)

## 2016-03-01 LAB — RESPIRATORY PANEL BY PCR
Adenovirus: NOT DETECTED
BORDETELLA PERTUSSIS-RVPCR: NOT DETECTED
CORONAVIRUS OC43-RVPPCR: NOT DETECTED
Chlamydophila pneumoniae: NOT DETECTED
Coronavirus 229E: NOT DETECTED
Coronavirus HKU1: NOT DETECTED
Coronavirus NL63: NOT DETECTED
Influenza A: NOT DETECTED
Influenza B: NOT DETECTED
METAPNEUMOVIRUS-RVPPCR: NOT DETECTED
Mycoplasma pneumoniae: NOT DETECTED
PARAINFLUENZA VIRUS 1-RVPPCR: NOT DETECTED
PARAINFLUENZA VIRUS 2-RVPPCR: NOT DETECTED
PARAINFLUENZA VIRUS 3-RVPPCR: NOT DETECTED
Parainfluenza Virus 4: NOT DETECTED
RESPIRATORY SYNCYTIAL VIRUS-RVPPCR: NOT DETECTED
RHINOVIRUS / ENTEROVIRUS - RVPPCR: NOT DETECTED

## 2016-03-01 LAB — URINE CULTURE: Culture: NO GROWTH

## 2016-03-01 LAB — CK: Total CK: 1399 U/L — ABNORMAL HIGH (ref 49–397)

## 2016-03-01 LAB — STREP PNEUMONIAE URINARY ANTIGEN: Strep Pneumo Urinary Antigen: POSITIVE — AB

## 2016-03-01 LAB — GLUCOSE, CAPILLARY
GLUCOSE-CAPILLARY: 86 mg/dL (ref 65–99)
Glucose-Capillary: 61 mg/dL — ABNORMAL LOW (ref 65–99)
Glucose-Capillary: 146 mg/dL — ABNORMAL HIGH (ref 65–99)

## 2016-03-01 LAB — PROCALCITONIN: Procalcitonin: 14.55 ng/mL

## 2016-03-01 MED ORDER — POLYETHYLENE GLYCOL 3350 17 G PO PACK
17.0000 g | PACK | Freq: Every day | ORAL | Status: DC
  Administered 2016-03-01 – 2016-03-03 (×3): 17 g via ORAL
  Filled 2016-03-01 (×3): qty 1

## 2016-03-01 MED ORDER — FLEET ENEMA 7-19 GM/118ML RE ENEM: 1.0000 | ENEMA | Freq: Every day | RECTAL | Status: DC

## 2016-03-01 MED ORDER — ORAL CARE MOUTH RINSE
15.0000 mL | Freq: Two times a day (BID) | OROMUCOSAL | Status: DC
  Administered 2016-03-01: 15 mL via OROMUCOSAL

## 2016-03-01 MED ORDER — BISACODYL 10 MG RE SUPP
10.0000 mg | Freq: Once | RECTAL | Status: AC
  Administered 2016-03-01: 10 mg via RECTAL
  Filled 2016-03-01: qty 1

## 2016-03-01 MED ORDER — DOXYCYCLINE HYCLATE 100 MG PO TABS: 100.0000 mg | ORAL_TABLET | Freq: Two times a day (BID) | ORAL | Status: DC

## 2016-03-01 MED ORDER — ADULT MULTIVITAMIN W/MINERALS CH
1.0000 | ORAL_TABLET | ORAL | Status: DC
  Filled 2016-03-01: qty 1

## 2016-03-01 MED ORDER — IPRATROPIUM-ALBUTEROL 0.5-2.5 (3) MG/3ML IN SOLN: 3.0000 mL | RESPIRATORY_TRACT | Status: DC | PRN

## 2016-03-01 MED ORDER — DEXTROSE 5 % IV SOLN
500.0000 mg | INTRAVENOUS | Status: DC
  Administered 2016-03-01: 500 mg via INTRAVENOUS
  Filled 2016-03-01 (×2): qty 500

## 2016-03-01 MED ORDER — OCUVITE-LUTEIN PO CAPS
1.0000 | ORAL_CAPSULE | ORAL | Status: DC
  Administered 2016-03-01 – 2016-03-02 (×2): 1 via ORAL
  Filled 2016-03-01 (×2): qty 1

## 2016-03-01 MED ORDER — TAMSULOSIN HCL 0.4 MG PO CAPS
0.4000 mg | ORAL_CAPSULE | Freq: Every day | ORAL | Status: DC
  Administered 2016-03-01 – 2016-03-02 (×2): 0.4 mg via ORAL
  Filled 2016-03-01 (×2): qty 1

## 2016-03-01 MED ORDER — ENOXAPARIN SODIUM 40 MG/0.4ML ~~LOC~~ SOLN
40.0000 mg | SUBCUTANEOUS | Status: DC
  Administered 2016-03-01 – 2016-03-02 (×2): 40 mg via SUBCUTANEOUS
  Filled 2016-03-01 (×2): qty 0.4

## 2016-03-01 MED ORDER — DEXTROSE-NACL 5-0.9 % IV SOLN
INTRAVENOUS | Status: DC
  Administered 2016-03-01 – 2016-03-02 (×3): via INTRAVENOUS

## 2016-03-01 NOTE — Progress Notes (Signed)
Pt now afebrile temp 99.5 pt has acute urinary retention bladder scan >597. New orders to place foley. Foley inserted pt. tolerated fair.pt denies pain or discomfort.

## 2016-03-01 NOTE — Consult Note (Signed)
[facility] Face-to-Face Psychiatry Consult   Reason for Consult:  Consult for this 76 year old man currently in the hospital for weakness and a recent fall. Question raised about depression Referring Physician:  Manuella Ghazi Patient Identification: David [name] MRN:  [id number] Principal Diagnosis: Bipolar disorder with moderate depression ([place]) Diagnosis:   Patient Active Problem List   Diagnosis Date Noted  . Bipolar disorder with moderate depression ([place]) [F31.32] 03/01/2016  . Hypotension [I95.9] 02/29/2016  . IDA (iron deficiency anemia) [D50.9] 06/19/2014  . Leucopenia [D72.819] 06/19/2014    Total Time spent with patient: 1 hour  Subjective:   David [name] is a 76 y.o. male patient admitted with "I'm depressed".  HPI:  Information obtained from the patient and the chart. Also reviewed psychiatric notes from his physician at [facility]. Also with his permission spoke with his wife. Patient and wife both agree on current symptoms that the patient has been depressed for several years. Not necessarily getting worse but has been stably feeling bad for a few years. Patient emphasizes how he has no motivation and doesn't want to socialize or do very much. His sleep is adequate. Appetite is adequate. He denies having any suicidal ideation. He denies any psychotic symptoms. He is currently taking methylphenidate as his only psychiatric medicine. He has been through multiple other medicines without any clear benefit. Wife is clearly very frustrated with his illness that he remains withdrawn and complains of such a severe lack of energy that he can barely get out of bed much of the time.  Social history: Patient is a retired Hotel manager. He and his wife live together. They have adult children. Sounds like there is a fundamentally stable relationship.  Medical history: Patient has chronic leukopenia which has been commented on extensively. Iron deficiency anemia currently with hypotension and a fall.  Substance abuse  history: Patient denies that he drinks and denies any past history of alcohol or drug abuse  Past Psychiatric History: Patient has been treated for bipolar disorder for years. His wife reports that when he was still working he tended to have what sounds like more hypomanic episodes. The patient himself didn't even see these as being pathologic. The depression has been severe for the past 3 years. He's had at least 1 hospitalization at [facility] for the sake of having ECT treatment (which didn't work). No history of suicide attempts violence or psychosis patient reportedly failed multiple antidepressants because they made him agitated. Multiple mood stabilizers have been over sedating and intolerable. Lithium appeared not to of been very beneficial and the current methylphenidate also seems to provide only slight improvement in his energy.  Risk to Self: Is patient at risk for suicide?: No Risk to Others:   Prior Inpatient Therapy:   Prior Outpatient Therapy:    Past Medical History:  Past Medical History:  Diagnosis Date  . Allergic rhinitis   . Anemia   . Bipolar disorder ([place])   . Elevated PSA    had biopsy at [facility] around 2009-2010 negative for malignancy  . Erectile dysfunction   . GERD (gastroesophageal reflux disease)   . Leucopenia   . Osteoarthritis    in hands  . Prostatitis   . Sigmoid polyp     Past Surgical History:  Procedure Laterality Date  . COLONOSCOPY  10/20/12  . HERNIA REPAIR    . Left knee replacement    . Right shoulder replacement     Family History:  Family History  Problem Relation Age of Onset  .  CVA Mother   . CAD Father    Family Psychiatric  History: No known family history Social History:  History  Alcohol Use No     History  Drug use: Unknown    Social History   Social History  . Marital status: Married    Spouse name: N/A  . Number of children: N/A  . Years of education: N/A   Social History Main Topics  . Smoking status: Former Smoker     Types: Cigarettes    Quit date: 02/22/1975  . Smokeless tobacco: Never Used  . Alcohol use No  . Drug use: Unknown  . Sexual activity: Not Asked   Other Topics Concern  . None   Social History Narrative  . None   Additional Social History:    Allergies:   Allergies  Allergen Reactions  . Quetiapine Anxiety    Restlessness, worsening anxiety  Restlessness, worsening anxiety     Labs:  Results for orders placed or performed during the hospital encounter of 02/29/16 (from the past 48 hour(s))  Lactic acid, plasma     Status: Abnormal   Collection Time: 02/29/16  2:38 PM  Result Value Ref Range   Lactic Acid, Venous 3.4 (HH) 0.5 - 1.9 mmol/L    Comment: CRITICAL RESULT CALLED TO, READ BACK BY AND VERIFIED WITH Martinique LOYE AT 1529 ON 02/29/16 JLJ   Comprehensive metabolic panel     Status: Abnormal   Collection Time: 02/29/16  2:39 PM  Result Value Ref Range   Sodium 137 135 - 145 mmol/L   Potassium 4.0 3.5 - 5.1 mmol/L   Chloride 104 101 - 111 mmol/L   CO2 26 22 - 32 mmol/L   Glucose, Bld 109 (H) 65 - 99 mg/dL   BUN 33 (H) 6 - 20 mg/dL   Creatinine, Ser 1.49 (H) 0.61 - 1.24 mg/dL   Calcium 9.8 8.9 - 10.3 mg/dL   Total Protein 6.6 6.5 - 8.1 g/dL   Albumin 3.6 3.5 - 5.0 g/dL   AST 63 (H) 15 - 41 U/L   ALT 28 17 - 63 U/L   Alkaline Phosphatase 52 38 - 126 U/L   Total Bilirubin 1.2 0.3 - 1.2 mg/dL   GFR calc non Af Amer 44 (L) >60 mL/min   GFR calc Af Amer 50 (L) >60 mL/min    Comment: (NOTE) The eGFR has been calculated using the CKD EPI equation. This calculation has not been validated in all clinical situations. eGFR's persistently <60 mL/min signify possible Chronic Kidney Disease.    Anion gap 7 5 - 15  Troponin I     Status: None   Collection Time: 02/29/16  2:39 PM  Result Value Ref Range   Troponin I <0.03 <0.03 ng/mL  CBC WITH DIFFERENTIAL     Status: Abnormal   Collection Time: 02/29/16  2:39 PM  Result Value Ref Range   WBC 3.4 (L) 3.8 - 10.6 K/uL    RBC 4.15 (L) 4.40 - 5.90 MIL/uL   Hemoglobin 13.7 13.0 - 18.0 g/dL   HCT 40.0 40.0 - 52.0 %   MCV 96.5 80.0 - 100.0 fL   MCH 33.1 26.0 - 34.0 pg   MCHC 34.4 32.0 - 36.0 g/dL   RDW 14.1 11.5 - 14.5 %   Platelets 131 (L) 150 - 440 K/uL   Neutrophils Relative % 97 %   Neutro Abs 3.3 1.4 - 6.5 K/uL   Lymphocytes Relative 2 %   Lymphs Abs 0.1 (  L) 1.0 - 3.6 K/uL   Monocytes Relative 1 %   Monocytes Absolute 0.0 (L) 0.2 - 1.0 K/uL   Eosinophils Relative 0 %   Eosinophils Absolute 0.0 0 - 0.7 K/uL   Basophils Relative 0 %   Basophils Absolute 0.0 0 - 0.1 K/uL  Blood Culture (routine x 2)     Status: None (Preliminary result)   Collection Time: 02/29/16  2:39 PM  Result Value Ref Range   Specimen Description BLOOD LEFT FFA    Special Requests      BOTTLES DRAWN AEROBIC AND ANAEROBIC AER 3 ML ANA 3ML   Culture NO GROWTH < 24 HOURS    Report Status PENDING   Blood Culture (routine x 2)     Status: None (Preliminary result)   Collection Time: 02/29/16  2:39 PM  Result Value Ref Range   Specimen Description BLOOD RT A    Special Requests      BOTTLES DRAWN AEROBIC AND ANAEROBIC AER 3CC, ANA2CC   Culture NO GROWTH < 24 HOURS    Report Status PENDING   Rapid Influenza A&B Antigens ([place] only)     Status: None   Collection Time: 02/29/16  2:39 PM  Result Value Ref Range   Influenza A ([facility]) NEGATIVE NEGATIVE   Influenza B ([facility]) NEGATIVE NEGATIVE  Urine culture     Status: None   Collection Time: 02/29/16  5:08 PM  Result Value Ref Range   Specimen Description URINE, RANDOM    Special Requests NONE    Culture NO GROWTH Performed at [facility]     Report Status 03/01/2016 FINAL   Urinalysis, Complete w Microscopic     Status: Abnormal   Collection Time: 02/29/16  5:08 PM  Result Value Ref Range   Color, Urine YELLOW (A) YELLOW   APPearance CLEAR (A) CLEAR   Specific Gravity, Urine 1.020 1.005 - 1.030   pH 6.0 5.0 - 8.0   Glucose, UA NEGATIVE NEGATIVE mg/dL   Hgb urine  dipstick LARGE (A) NEGATIVE   Bilirubin Urine NEGATIVE NEGATIVE   Ketones, ur NEGATIVE NEGATIVE mg/dL   Protein, ur 100 (A) NEGATIVE mg/dL   Nitrite NEGATIVE NEGATIVE   Leukocytes, UA NEGATIVE NEGATIVE   RBC / HPF TOO NUMEROUS TO COUNT 0 - 5 RBC/hpf   WBC, UA 0-5 0 - 5 WBC/hpf   Bacteria, UA NONE SEEN NONE SEEN   Squamous Epithelial / LPF NONE SEEN NONE SEEN   Mucous PRESENT   Urine Drug Screen, Qualitative ([facility] only)     Status: None   Collection Time: 02/29/16  5:08 PM  Result Value Ref Range   Tricyclic, Ur Screen NONE DETECTED NONE DETECTED   Amphetamines, Ur Screen NONE DETECTED NONE DETECTED   MDMA (Ecstasy)Ur Screen NONE DETECTED NONE DETECTED   Cocaine Metabolite,Ur [place] NONE DETECTED NONE DETECTED   Opiate, Ur Screen NONE DETECTED NONE DETECTED   Phencyclidine (PCP) Ur S NONE DETECTED NONE DETECTED   Cannabinoid 50 Ng, Ur [place] NONE DETECTED NONE DETECTED   Barbiturates, Ur Screen NONE DETECTED NONE DETECTED   Benzodiazepine, Ur Scrn NONE DETECTED NONE DETECTED   Methadone Scn, Ur NONE DETECTED NONE DETECTED    Comment: (NOTE) 846  Tricyclics, urine               Cutoff 1000 ng/mL 200  Amphetamines, urine             Cutoff 1000 ng/mL 300  MDMA (Ecstasy), urine  Cutoff 500 ng/mL 400  Cocaine Metabolite, urine       Cutoff 300 ng/mL 500  Opiate, urine                   Cutoff 300 ng/mL 600  Phencyclidine (PCP), urine      Cutoff 25 ng/mL 700  Cannabinoid, urine              Cutoff 50 ng/mL 800  Barbiturates, urine             Cutoff 200 ng/mL 900  Benzodiazepine, urine           Cutoff 200 ng/mL 1000 Methadone, urine                Cutoff 300 ng/mL 1100 1200 The urine drug screen provides only a preliminary, unconfirmed 1300 analytical test result and should not be used for non-medical 1400 purposes. Clinical consideration and professional judgment should 1500 be applied to any positive drug screen result due to possible 1600 interfering substances. A more  specific alternate chemical method 1700 must be used in order to obtain a confirmed analytical result.  1800 Gas chromato graphy / mass spectrometry (GC/MS) is the preferred 1900 confirmatory method.   Lactic acid, plasma     Status: Abnormal   Collection Time: 02/29/16  5:09 PM  Result Value Ref Range   Lactic Acid, Venous 2.1 (HH) 0.5 - 1.9 mmol/L    Comment: CRITICAL RESULT CALLED TO, READ BACK BY AND VERIFIED WITH LINDA MCLAMB @ 1826 02/29/16 BY [facility]   CK     Status: Abnormal   Collection Time: 02/29/16  7:23 PM  Result Value Ref Range   Total CK 1,646 (H) 49 - 397 U/L  Sedimentation rate     Status: Abnormal   Collection Time: 02/29/16  7:23 PM  Result Value Ref Range   Sed Rate 49 (H) 0 - 20 mm/hr  Lithium level     Status: Abnormal   Collection Time: 02/29/16  7:23 PM  Result Value Ref Range   Lithium Lvl <0.06 (L) 0.60 - 1.20 mmol/L  Glucose, capillary     Status: Abnormal   Collection Time: 02/29/16  8:41 PM  Result Value Ref Range   Glucose-Capillary 61 (L) 65 - 99 mg/dL  Procalcitonin - Baseline     Status: None   Collection Time: 02/29/16  8:53 PM  Result Value Ref Range   Procalcitonin 15.22 ng/mL    Comment:        Interpretation: PCT >= 10 ng/mL: Important systemic inflammatory response, almost exclusively due to severe bacterial sepsis or septic shock. (NOTE)         ICU PCT Algorithm               Non ICU PCT Algorithm    ----------------------------     ------------------------------         PCT < 0.25 ng/mL                 PCT < 0.1 ng/mL     Stopping of antibiotics            Stopping of antibiotics       strongly encouraged.               strongly encouraged.    ----------------------------     ------------------------------       PCT level decrease by  PCT < 0.25 ng/mL       >= 80% from peak PCT       OR PCT 0.25 - 0.5 ng/mL          Stopping of antibiotics                                             encouraged.     Stopping of  antibiotics           encouraged.    ----------------------------     ------------------------------       PCT level decrease by              PCT >= 0.25 ng/mL       < 80% from peak PCT        AND PCT >= 0.5 ng/mL             Continuing antibiotics                                              encouraged.       Continuing antibiotics            encouraged.    ----------------------------     ------------------------------     PCT level increase compared          PCT > 0.5 ng/mL         with peak PCT AND          PCT >= 0.5 ng/mL             Escalation of antibiotics                                          strongly encouraged.      Escalation of antibiotics        strongly encouraged.   Lactic acid, plasma     Status: None   Collection Time: 02/29/16  8:53 PM  Result Value Ref Range   Lactic Acid, Venous 1.4 0.5 - 1.9 mmol/L  MRSA PCR Screening     Status: None   Collection Time: 02/29/16  9:04 PM  Result Value Ref Range   MRSA by PCR NEGATIVE NEGATIVE    Comment:        The GeneXpert MRSA Assay (FDA approved for NASAL specimens only), is one component of a comprehensive MRSA colonization surveillance program. It is not intended to diagnose MRSA infection nor to guide or monitor treatment for MRSA infections.   Glucose, capillary     Status: Abnormal   Collection Time: 02/29/16 10:00 PM  Result Value Ref Range   Glucose-Capillary 111 (H) 65 - 99 mg/dL  Respiratory Panel by PCR     Status: None   Collection Time: 02/29/16 11:56 PM  Result Value Ref Range   Adenovirus NOT DETECTED NOT DETECTED   Coronavirus 229E NOT DETECTED NOT DETECTED   Coronavirus HKU1 NOT DETECTED NOT DETECTED   Coronavirus NL63 NOT DETECTED NOT DETECTED   Coronavirus OC43 NOT DETECTED NOT DETECTED   Metapneumovirus NOT DETECTED NOT DETECTED   Rhinovirus / Enterovirus NOT DETECTED NOT DETECTED   Influenza A NOT DETECTED NOT  DETECTED   Influenza B NOT DETECTED NOT DETECTED   Parainfluenza Virus 1  NOT DETECTED NOT DETECTED   Parainfluenza Virus 2 NOT DETECTED NOT DETECTED   Parainfluenza Virus 3 NOT DETECTED NOT DETECTED   Parainfluenza Virus 4 NOT DETECTED NOT DETECTED   Respiratory Syncytial Virus NOT DETECTED NOT DETECTED   Bordetella pertussis NOT DETECTED NOT DETECTED   Chlamydophila pneumoniae NOT DETECTED NOT DETECTED   Mycoplasma pneumoniae NOT DETECTED NOT DETECTED    Comment: Performed at [facility]  Glucose, capillary     Status: None   Collection Time: 03/01/16 12:59 AM  Result Value Ref Range   Glucose-Capillary 86 65 - 99 mg/dL  Procalcitonin     Status: None   Collection Time: 03/01/16  4:32 AM  Result Value Ref Range   Procalcitonin 14.55 ng/mL    Comment:        Interpretation: PCT >= 10 ng/mL: Important systemic inflammatory response, almost exclusively due to severe bacterial sepsis or septic shock. (NOTE)         ICU PCT Algorithm               Non ICU PCT Algorithm    ----------------------------     ------------------------------         PCT < 0.25 ng/mL                 PCT < 0.1 ng/mL     Stopping of antibiotics            Stopping of antibiotics       strongly encouraged.               strongly encouraged.    ----------------------------     ------------------------------       PCT level decrease by               PCT < 0.25 ng/mL       >= 80% from peak PCT       OR PCT 0.25 - 0.5 ng/mL          Stopping of antibiotics                                             encouraged.     Stopping of antibiotics           encouraged.    ----------------------------     ------------------------------       PCT level decrease by              PCT >= 0.25 ng/mL       < 80% from peak PCT        AND PCT >= 0.5 ng/mL             Continuing antibiotics                                              encouraged.       Continuing antibiotics            encouraged.    ----------------------------     ------------------------------     PCT level increase  compared          PCT > 0.5 ng/mL  with peak PCT AND          PCT >= 0.5 ng/mL             Escalation of antibiotics                                          strongly encouraged.      Escalation of antibiotics        strongly encouraged.   Basic metabolic panel     Status: Abnormal   Collection Time: 03/01/16  4:32 AM  Result Value Ref Range   Sodium 137 135 - 145 mmol/L   Potassium 4.2 3.5 - 5.1 mmol/L   Chloride 110 101 - 111 mmol/L   CO2 19 (L) 22 - 32 mmol/L   Glucose, Bld 138 (H) 65 - 99 mg/dL   BUN 29 (H) 6 - 20 mg/dL   Creatinine, Ser 1.41 (H) 0.61 - 1.24 mg/dL   Calcium 8.7 (L) 8.9 - 10.3 mg/dL   GFR calc non Af Amer 47 (L) >60 mL/min   GFR calc Af Amer 54 (L) >60 mL/min    Comment: (NOTE) The eGFR has been calculated using the CKD EPI equation. This calculation has not been validated in all clinical situations. eGFR's persistently <60 mL/min signify possible Chronic Kidney Disease.    Anion gap 8 5 - 15  Glucose, capillary     Status: Abnormal   Collection Time: 03/01/16  4:33 AM  Result Value Ref Range   Glucose-Capillary 146 (H) 65 - 99 mg/dL  CK     Status: Abnormal   Collection Time: 03/01/16  9:39 AM  Result Value Ref Range   Total CK 1,399 (H) 49 - 397 U/L  Strep pneumoniae urinary antigen     Status: Abnormal   Collection Time: 03/01/16  2:52 PM  Result Value Ref Range   Strep Pneumo Urinary Antigen POSITIVE (A) NEGATIVE    Comment: Performed at [facility]    Current Facility-Administered Medications  Medication Dose Route Frequency Provider Last Rate Last Dose  . acetaminophen (TYLENOL) tablet 500 mg  500 mg Oral Q8H PRN Loletha Grayer, MD   500 mg at 03/01/16 0107  . azithromycin (ZITHROMAX) 500 mg in dextrose 5 % 250 mL IVPB  500 mg Intravenous Q24H Wilhelmina Mcardle, MD   500 mg at 03/01/16 1234  . cefTRIAXone (ROCEPHIN) IVPB 1 g  1 g Intravenous Q24H Darylene Price Hurt, RPH   1 g at 03/01/16 1827  . dextrose 5 %-0.9 % sodium chloride  infusion   Intravenous Continuous Awilda Bill, NP 100 mL/hr at 03/01/16 1234    . enoxaparin (LOVENOX) injection 40 mg  40 mg Subcutaneous Q24H Wilhelmina Mcardle, MD   40 mg at 03/01/16 2005  . ipratropium-albuterol (DUONEB) 0.5-2.5 (3) MG/3ML nebulizer solution 3 mL  3 mL Nebulization Q4H PRN Wilhelmina Mcardle, MD      . lidocaine (PF) (XYLOCAINE) 1 % injection 5 mL  5 mL Intradermal Once Nance Pear, MD      . MEDLINE mouth rinse  15 mL Mouth Rinse BID Max Sane, MD   15 mL at 03/01/16 0944  . methylphenidate (RITALIN) tablet 20 mg  20 mg Oral BID WC Loletha Grayer, MD   20 mg at 03/01/16 1446  . multivitamin with minerals tablet 1 tablet  1 tablet Oral Q24H Vipul Manuella Ghazi,  MD      . multivitamin-lutein (OCUVITE-LUTEIN) capsule 1 capsule  1 capsule Oral Q24H Max Sane, MD   1 capsule at 03/01/16 1230  . polyethylene glycol (MIRALAX / GLYCOLAX) packet 17 g  17 g Oral Daily Loletha Grayer, MD   17 g at 03/01/16 1445  . sodium phosphate (FLEET) 7-19 GM/118ML enema 1 enema  1 enema Rectal QHS Loletha Grayer, MD      . tamsulosin (FLOMAX) capsule 0.4 mg  0.4 mg Oral QHS Loletha Grayer, MD        Musculoskeletal: Strength & Muscle Tone: within normal limits Gait & Station: unable to stand Patient leans: Backward  Psychiatric Specialty Exam: Physical Exam  Nursing note and vitals reviewed. Constitutional: He appears well-developed and well-nourished.  HENT:  Head: Normocephalic and atraumatic.  Eyes: Conjunctivae are normal. Pupils are equal, round, and reactive to light.  Neck: Normal range of motion.  Cardiovascular: Regular rhythm and normal heart sounds.   Respiratory: Effort normal. No respiratory distress.  GI: Soft.  Musculoskeletal: Normal range of motion.  Neurological: He is alert.  Skin: Skin is warm and dry.  Psychiatric: His speech is normal and behavior is normal. Judgment and thought content normal. Cognition and memory are normal. He exhibits a depressed mood. He  expresses no suicidal ideation.    Review of Systems  HENT: Negative.   Eyes: Negative.   Respiratory: Negative.   Cardiovascular: Negative.   Gastrointestinal: Negative.   Musculoskeletal: Negative.   Skin: Negative.   Neurological: Positive for weakness.  Psychiatric/Behavioral: Positive for depression. Negative for hallucinations, memory loss, substance abuse and suicidal ideas. The patient is nervous/anxious and has insomnia.     Blood pressure 102/63, pulse 90, temperature 99.5 F (37.5 C), temperature source Oral, resp. rate (!) 25, height '5\' 9"'$  (1.753 m), weight 65.8 kg (145 lb 1 oz), SpO2 94 %.Body mass index is 21.42 kg/m.  General Appearance: Fairly Groomed  Eye Contact:  Fair  Speech:  Clear and Coherent  Volume:  Normal  Mood:  Depressed  Affect:  Constricted  Thought Process:  Goal Directed  Orientation:  Full (Time, Place, and Person)  Thought Content:  Logical  Suicidal Thoughts:  No  Homicidal Thoughts:  No  Memory:  Immediate;   Good Recent;   Good Remote;   Fair  Judgement:  Impaired  Insight:  Shallow  Psychomotor Activity:  Decreased  Concentration:  Concentration: Fair  Recall:  [employer] of Knowledge:  Fair  Language:  Fair  Akathisia:  No  Handed:  Right  AIMS (if indicated):     Assets:  Communication Skills Desire for Improvement Financial Resources/Insurance Housing Resilience Social Support  ADL's:  Impaired  Cognition:  WNL  Sleep:        Treatment Plan Summary: Plan This is a 76 year old man with a history of a diagnosis of bipolar disorder although it doesn't sound like he's ever had a psychotic mania. He has had persistent depression for several years that has been unresponsive to multiple medications and even to a trial of ECT. The symptoms appear to mostly be a lack of motivation and a lack of energy. Interacting with the patient one would not necessarily guess that he is severely depressed. His affect is fairly euthymic. He has  no suicidal thoughts. I have only met patient for a short period of time but it seems to me there is little benefit to changing his medicine at this point. As I told the patient  and his wife I can't tell them I have any brilliant ideas that his outpatient psychiatrist wouldn't already have tried. Instead I have suggested to both of them that the patient really needs to be seeing a psychotherapist ideally perhaps somebody who does interpersonal therapy. It sounds like his depression mostly has been present just since he retired from work. Patient needs to be pushed and motivated to be more physically active as well. Patient is not acutely suicidal does not need suicide watch and does not need hospitalization on the psychiatric ward. I will follow-up as needed.  Disposition: Patient does not meet criteria for psychiatric inpatient admission. Supportive therapy provided about ongoing stressors.  Alethia Berthold, MD 03/01/2016 8:32 PM

## 2016-03-01 NOTE — Progress Notes (Signed)
Pt . Arrived to unit AAO x 4 with generalized weakness noted BS 61 hypoglycemic protocol initiated. Pt denies pain or distress o2 3lnc sats 90-91%.

## 2016-03-01 NOTE — Progress Notes (Signed)
Pt sats 87-88% oxygen increased to 6lnc sats 91-92% . Stat chest xray done. Pt noted to have infiltrates . Pt IVF changed to D5NS @100ml  due to low Blood sugar. Temp 102.5 tylenol given as ordered.

## 2016-03-01 NOTE — Evaluation (Signed)
Clinical/Bedside Swallow Evaluation Patient Details  Name: David [name] MRN: [id number] Date of Birth: 04-17-1940  Today's Date: 03/01/2016 Time:        Past Medical History:  Past Medical History:  Diagnosis Date  . Allergic rhinitis   . Anemia   . Bipolar disorder ([place])   . Elevated PSA    had biopsy at [facility] around 2009-2010 negative for malignancy  . Erectile dysfunction   . GERD (gastroesophageal reflux disease)   . Leucopenia   . Osteoarthritis    in hands  . Prostatitis   . Sigmoid polyp    Past Surgical History:  Past Surgical History:  Procedure Laterality Date  . COLONOSCOPY  10/20/12  . HERNIA REPAIR    . Left knee replacement    . Right shoulder replacement     HPI: Per history and Physical on admission:   "David [name]  is a 76 y.o. male with a known history of Bipolar disorder presents with severe weakness all extremities and he could hardly control his arms or legs today. He had total body weakness. He complains of a sore throat some cough and fever. Today he had a fall and hit his head. In the ER he had a fever of 102 and he felt very weak."   Assessment / Plan / Recommendation Clinical Impression  Pt presents with functional swallowing at bedside with no overt s/s of aspiration with any tested consistencies. Oral mech exam WFL with minimal weakness noted. Pt was admitted with overall weakness and decline but has shown improvement since admission per MD. Vocal quality remained clear throughout assessment, laryngeal elevation appeared adequate. Speech was clear and appropraite. Pt was able to take meds whole with sips of water during the assessment today. Rec diet upgrade to regular. No further ST follow up unless Nsg reports dyspgagia with new diet ordered.    Aspiration Risk  Mild aspiration risk    Diet Recommendation Regular   Liquid Administration via: Cup;Straw Medication Administration: Whole meds with liquid Postural Changes: Seated upright at 90  degrees    Other  Recommendations     Follow up Recommendations None      Frequency and Duration            Prognosis        Swallow Study   General Type of Study: Bedside Swallow Evaluation Diet Prior to this Study: Dysphagia 1 (puree) Temperature Spikes Noted: Yes Respiratory Status: Nasal cannula History of Recent Intubation: No Behavior/Cognition: Alert;Cooperative Oral Cavity Assessment: Within Functional Limits Oral Care Completed by SLP: No Oral Cavity - Dentition: Adequate natural dentition Vision: Functional for self-feeding Self-Feeding Abilities: Needs set up Patient Positioning: Upright in bed Baseline Vocal Quality: Normal Volitional Cough: Strong Volitional Swallow: Able to elicit    Oral/Motor/Sensory Function Overall Oral Motor/Sensory Function: Within functional limits   Ice Chips Ice chips: Not tested   Thin Liquid Thin Liquid: Within functional limits Presentation: Cup;Spoon;Straw    Nectar Thick Nectar Thick Liquid: Not tested   Honey Thick Honey Thick Liquid: Not tested   Puree Puree: Within functional limits Presentation: Self Fed   Solid   GO   Solid: Within functional limits Presentation: Self Fed        David [name] 03/01/2016,12:55 PM

## 2016-03-01 NOTE — Progress Notes (Signed)
[place] responded to an OR for prayer in room IC-14. Pt was awake, alert, and presented depressed. Wife was bedside. Pt stated the the MD had discovered pneumonia in his lower lung. Wife presented that the Pt has been struggling with depression for the last 5 years. Both requested prayer, which was provided. CH is available for follow up as needed.    03/01/16 1000  Clinical Encounter Type  Visited With Patient;Patient and family together  Visit Type Initial;Spiritual support  Referral From Nurse  Spiritual Encounters  Spiritual Needs Prayer;Emotional  Stress Factors  Patient Stress Factors Health changes

## 2016-03-01 NOTE — Progress Notes (Signed)
Patient ID: David [name], male   DOB: 1940-03-23, 75 y.o.   MRN: [id number]  [employer] PROGRESS NOTE  David [name] [id number] DOB: 1940-12-05 DOA: 02/29/2016 PCP: Ezequiel Kayser, MD  HPI/Subjective: Patient feeling better today than yesterday. Some cough and some shortness of breath. His strength is a little bit better but still feels very weak. Last night had trouble urinating and had urinary retention with 600 cc of urine in the bladder and a Foley catheter was placed. Wife states that he has severe depression and does not walk around as much as he normally should.  Objective: Vitals:   03/01/16 1100 03/01/16 1200  BP: 95/63 99/65  Pulse: (!) 109 99  Resp: (!) 29 (!) 30  Temp:      Filed Weights   02/29/16 1418 02/29/16 2100  Weight: 63.5 kg (140 lb) 65.8 kg (145 lb 1 oz)    ROS: Review of Systems  Constitutional: Negative for chills and fever.  HENT: Positive for sore throat.   Eyes: Negative for blurred vision.  Respiratory: Positive for cough and shortness of breath.   Cardiovascular: Negative for chest pain.  Gastrointestinal: Negative for abdominal pain, constipation, diarrhea, nausea and vomiting.  Genitourinary: Negative for dysuria.  Musculoskeletal: Negative for joint pain.  Neurological: Negative for dizziness and headaches.   Exam: Physical Exam  Constitutional: He is oriented to person, place, and time.  HENT:  Nose: No mucosal edema.  Mouth/Throat: No oropharyngeal exudate or posterior oropharyngeal edema.  Eyes: Conjunctivae, EOM and lids are normal. Pupils are equal, round, and reactive to light.  Neck: No JVD present. Carotid bruit is not present. No edema present. No thyroid mass and no thyromegaly present.  Cardiovascular: S1 normal and S2 normal.  Tachycardia present.  Exam reveals no gallop.   No murmur heard. Pulses:      Dorsalis pedis pulses are 2+ on the right side, and 2+ on the left side.  Respiratory: No respiratory distress. He has  decreased breath sounds in the right middle field, the right lower field, the left middle field and the left lower field. He has no wheezes. He has rhonchi in the right middle field, the right lower field, the left middle field and the left lower field. He has no rales.  GI: Soft. Bowel sounds are normal. There is no tenderness.  Musculoskeletal:       Right ankle: He exhibits no swelling.       Left ankle: He exhibits no swelling.  Lymphadenopathy:    He has no cervical adenopathy.  Neurological: He is alert and oriented to person, place, and time. No cranial nerve deficit.  Skin: Skin is warm. No rash noted. Nails show no clubbing.  Psychiatric: He has a normal mood and affect.      Data Reviewed: Basic Metabolic Panel:  Recent Labs Lab 02/29/16 1439  NA 137  K 4.0  CL 104  CO2 26  GLUCOSE 109*  BUN 33*  CREATININE 1.49*  CALCIUM 9.8   Liver Function Tests:  Recent Labs Lab 02/29/16 1439  AST 63*  ALT 28  ALKPHOS 52  BILITOT 1.2  PROT 6.6  ALBUMIN 3.6   CBC:  Recent Labs Lab 02/29/16 1439  WBC 3.4*  NEUTROABS 3.3  HGB 13.7  HCT 40.0  MCV 96.5  PLT 131*   Cardiac Enzymes:  Recent Labs Lab 02/29/16 1439 02/29/16 1923 03/01/16 [place]* 1,399*  TROPONINI <0.03  --   --  CBG:  Recent Labs Lab 02/29/16 2041 02/29/16 2200 03/01/16 0059 03/01/16 0433  GLUCAP 61* 111* 86 146*    Recent Results (from the past 240 hour(s))  Blood Culture (routine x 2)     Status: None (Preliminary result)   Collection Time: 02/29/16  2:39 PM  Result Value Ref Range Status   Specimen Description BLOOD LEFT FFA  Final   Special Requests   Final    BOTTLES DRAWN AEROBIC AND ANAEROBIC AER 3 ML ANA 3ML   Culture NO GROWTH < 24 HOURS  Final   Report Status PENDING  Incomplete  Blood Culture (routine x 2)     Status: None (Preliminary result)   Collection Time: 02/29/16  2:39 PM  Result Value Ref Range Status   Specimen Description BLOOD RT A   Final   Special Requests   Final    BOTTLES DRAWN AEROBIC AND ANAEROBIC AER 3CC, ANA2CC   Culture NO GROWTH < 24 HOURS  Final   Report Status PENDING  Incomplete  Rapid Influenza A&B Antigens ([place] only)     Status: None   Collection Time: 02/29/16  2:39 PM  Result Value Ref Range Status   Influenza A ([place]) NEGATIVE NEGATIVE Final   Influenza B ([facility]) NEGATIVE NEGATIVE Final  MRSA PCR Screening     Status: None   Collection Time: 02/29/16  9:04 PM  Result Value Ref Range Status   MRSA by PCR NEGATIVE NEGATIVE Final    Comment:        The GeneXpert MRSA Assay (FDA approved for NASAL specimens only), is one component of a comprehensive MRSA colonization surveillance program. It is not intended to diagnose MRSA infection nor to guide or monitor treatment for MRSA infections.      Studies: Ct Head Wo Contrast  Result Date: 02/29/2016 CLINICAL DATA:  Fall, hit back of head. EXAM: CT HEAD WITHOUT CONTRAST TECHNIQUE: Contiguous axial images were obtained from the base of the skull through the vertex without intravenous contrast. COMPARISON:  MRI 12/26/2013 FINDINGS: Brain: No acute intracranial abnormality. Specifically, no hemorrhage, hydrocephalus, mass lesion, acute infarction, or significant intracranial injury. Vascular: No hyperdense vessel or unexpected calcification. Skull: No acute calvarial abnormality. Sinuses/Orbits: Visualized paranasal sinuses and mastoids clear. Orbital soft tissues unremarkable. Other: None IMPRESSION: No acute intracranial abnormality. Electronically Signed   By: Rolm Baptise M.D.   On: 02/29/2016 15:02   Ct Cervical Spine Wo Contrast  Result Date: 02/29/2016 CLINICAL DATA:  Fall earlier today EXAM: CT CERVICAL SPINE WITHOUT CONTRAST TECHNIQUE: Multidetector CT imaging of the cervical spine was performed without intravenous contrast. Multiplanar CT image reconstructions were also generated. COMPARISON:  Head CT 02/29/2016 FINDINGS: Alignment: Straightening  of the cervical spine. No subluxation. Facet alignment normal. Skull base and vertebrae: Craniovertebral junction is intact. Vertebral body heights are maintained. There is no fracture. Soft tissues and spinal canal: No prevertebral fluid or swelling. No visible canal hematoma. Disc levels: Severe degenerative disc changes at C3-C4, C4-C5, C5-C6 and C6-C7. Posterior disc osteophyte complex C3-C4, C4-C5, C5-C6 and C6-C7 results in mild to moderate canal stenosis. Multilevel bilateral foraminal stenosis as well. Upper chest: Left apical scarring or atelectasis. Image thyroid within normal limits. Other: None IMPRESSION: Straightening of the cervical spine with severe degenerative disc changes from C4 through C7 ; multilevel bilateral foraminal stenosis and mild to moderate canal stenosis. No acute osseous abnormality. Electronically Signed   By: Donavan Foil M.D.   On: 02/29/2016 18:55   Dg Chest Port 1  View  Result Date: 03/01/2016 CLINICAL DATA:  Acute respiratory distress. EXAM: PORTABLE CHEST 1 VIEW COMPARISON:  02/29/2016 FINDINGS: Normal heart size and pulmonary vascularity. Since the previous study, there is increasing airspace disease involving the left mid and lower lung in the right mid lung. This suggest multifocal pneumonia. Possible small left pleural effusion. Tortuous aorta. Degenerative changes in the spine and left shoulder. Postoperative change in the right shoulder. IMPRESSION: Developing areas of infiltration/ consolidation in the right mid lung and left mid and lower lung zones suggesting developing multifocal pneumonia. Electronically Signed   By: Lucienne Capers M.D.   On: 03/01/2016 00:03   Dg Chest Port 1 View  Result Date: 02/29/2016 CLINICAL DATA:  Status post fall, weakness EXAM: PORTABLE CHEST 1 VIEW COMPARISON:  07/16/2009 FINDINGS: There is no focal parenchymal opacity. There is no pleural effusion or pneumothorax. The heart and mediastinal contours are unremarkable. There is a  right shoulder arthroplasty. There are degenerative changes of bilateral acromioclavicular joints. IMPRESSION: No active disease. Electronically Signed   By: Kathreen Devoid   On: 02/29/2016 15:10    Scheduled Meds: . azithromycin  500 mg Intravenous Q24H  . cefTRIAXone  1 g Intravenous Q24H  . enoxaparin (LOVENOX) injection  40 mg Subcutaneous Q24H  . lidocaine (PF)  5 mL Intradermal Once  . mouth rinse  15 mL Mouth Rinse BID  . methylphenidate  20 mg Oral BID WC  . multivitamin with minerals  1 tablet Oral Q24H  . multivitamin-lutein  1 capsule Oral Q24H  . tamsulosin  0.4 mg Oral QHS   Continuous Infusions: . dextrose 5 % and 0.9% NaCl 100 mL/hr at 03/01/16 1234    Assessment/Plan:  1. Clinical sepsis present on admission with bilateral pneumonia on repeat chest x-ray. Hypotension, tachycardia and leukopenia. Aggressive antibiotics given in the ER with vancomycin and Zosyn. I switched that over to Rocephin and doxycycline. Critical care specialist switched to doxycycline to Zithromax. Patient currently on Rocephin and Zithromax. Continue IV fluid hydration for hypotension 2. Severe weakness from sepsis and traumatic rhabdomyolysis from fall. Continue IV fluid hydration. Cervical spine CT scan showed degenerative changes but nothing that would cause the acute weakness. 3. Urinary retention and hematuria. Patient had Foley catheter placed. Start Flomax at night. CT scan of the abdomen and pelvis renal stone protocol 4. Bipolar disorder with severe depression. Wife requesting psychiatry consultation. Patient has not tolerated numerous medications in the past. Currently on Ritalin. 5. COPD exacerbation. Critical care specialist stop steroids 6. Repeat BMP  Code Status:     Code Status Orders        Start     Ordered   03/01/16 1035  Full code  Continuous     03/01/16 1034    Code Status History    Date Active Date Inactive Code Status Order ID Comments User Context   This patient  has a current code status but no historical code status.     Family Communication: Wife at the bedside Disposition Plan: To be determined  Consultants:  Critical care specialist  Neurology  Psychiatry  Antibiotics:  Rocephin  Zithromax  Time spent: 28 minutes  [place], [place]

## 2016-03-02 ENCOUNTER — Encounter: Payer: Self-pay | Admitting: Internal Medicine

## 2016-03-02 ENCOUNTER — Inpatient Hospital Stay: Payer: Medicare HMO

## 2016-03-02 DIAGNOSIS — J9601 Acute respiratory failure with hypoxia: Secondary | ICD-10-CM

## 2016-03-02 DIAGNOSIS — J189 Pneumonia, unspecified organism: Secondary | ICD-10-CM

## 2016-03-02 DIAGNOSIS — J13 Pneumonia due to Streptococcus pneumoniae: Secondary | ICD-10-CM

## 2016-03-02 DIAGNOSIS — J181 Lobar pneumonia, unspecified organism: Secondary | ICD-10-CM

## 2016-03-02 DIAGNOSIS — F3132 Bipolar disorder, current episode depressed, moderate: Secondary | ICD-10-CM

## 2016-03-02 DIAGNOSIS — J9600 Acute respiratory failure, unspecified whether with hypoxia or hypercapnia: Secondary | ICD-10-CM

## 2016-03-02 LAB — BASIC METABOLIC PANEL
Anion gap: 6 (ref 5–15)
BUN: 27 mg/dL — AB (ref 6–20)
CALCIUM: 8.8 mg/dL — AB (ref 8.9–10.3)
CHLORIDE: 110 mmol/L (ref 101–111)
CO2: 22 mmol/L (ref 22–32)
Creatinine, Ser: 1.1 mg/dL (ref 0.61–1.24)
GFR calc Af Amer: >60 mL/min (ref >60)
GFR calc non Af Amer: >60 mL/min (ref >60)
GLUCOSE: 132 mg/dL — AB (ref 65–99)
Potassium: 4.1 mmol/L (ref 3.5–5.1)
Sodium: 138 mmol/L (ref 135–145)

## 2016-03-02 LAB — CBC
HEMATOCRIT: 35.5 % — AB (ref 40.0–52.0)
Hemoglobin: 12.2 g/dL — ABNORMAL LOW (ref 13.0–18.0)
MCH: 32.9 pg (ref 26.0–34.0)
MCHC: 34.5 g/dL (ref 32.0–36.0)
MCV: 95.6 fL (ref 80.0–100.0)
Platelets: 136 10*3/uL — ABNORMAL LOW (ref 150–440)
RBC: 3.71 MIL/uL — ABNORMAL LOW (ref 4.40–5.90)
RDW: 14.2 % (ref 11.5–14.5)
WBC: 3.5 10*3/uL — AB (ref 3.8–10.6)

## 2016-03-02 LAB — CK: Total CK: 604 U/L — ABNORMAL HIGH (ref 49–397)

## 2016-03-02 LAB — PROCALCITONIN: PROCALCITONIN: 8.52 ng/mL

## 2016-03-02 LAB — CORTISOL-AM, BLOOD: Cortisol - AM: 3.4 ug/dL — ABNORMAL LOW (ref 6.7–22.6)

## 2016-03-02 LAB — ANA W/REFLEX IF POSITIVE: Anti Nuclear Antibody(ANA): NEGATIVE

## 2016-03-02 MED ORDER — PREDNISONE 50 MG PO TABS
50.0000 mg | ORAL_TABLET | Freq: Every day | ORAL | Status: DC
  Administered 2016-03-02 – 2016-03-03 (×2): 50 mg via ORAL
  Filled 2016-03-02 (×2): qty 1

## 2016-03-02 NOTE — Care Management (Addendum)
PT is recommending Home Health PT with Rolling walker (two wheeled). Message left for patient's wife that home health list along with my contact information has been left at patient's bedside. RNCM will follow up also with patient. I understand that patient's wife works and I see that patient was unsteady when first up with PT and "one person hand held assist" with rolling walker. I also left a private duty sitter list with patient.

## 2016-03-02 NOTE — Care Management Note (Addendum)
Case Management Note  Patient Details  Name: David [name] MRN: [id number] Date of Birth: 1940/07/04  Subjective/Objective:                  Met with patient and his wife was at his bedside. I explained home health PT and homebound requirement to both patient and his wife. Patient states he is homebound due to weakness. His wife seemed to make decisions for him so I tried to redirect the decisions to the patient. They state that they have two daughters that live across the street from them and usually available to come stay with patient if wife has to leave.He states that is usually only alone for a few hours. His wife said "I just tell him not to get up until I return home".  Patient is from home where he was not that active prior to this admission. He does not have any DME to help with ambulation. His PCP is Dr. Dorthula Perfect with [facility]. He denies any problems paying with medications. He gets them from [facility]. O2 is acute.  Action/Plan: Referral to [facility] for front-wheeled walker and possibly home O2. Home health list and private duty list provided to patient for review. He will call this RNCM when he decides what agency he will use. I have requested Rx for walker from MD.  Expected Discharge Date:                  Expected Discharge Plan:     In-House Referral:     Discharge planning Services  CM Consult  Post Acute Care Choice:  Home Health, Durable Medical Equipment Choice offered to:  Spouse, Patient  DME Arranged:  Walker rolling, Oxygen DME Agency:  [place]:  PT [facility] Agency:     Status of Service:  In process, will continue to follow  If discussed at Long Length of Stay Meetings, dates discussed:    Additional Comments:  Marshell Garfinkel, RN 03/02/2016, 2:38 PM

## 2016-03-02 NOTE — Progress Notes (Signed)
CH made a follow up visit. Pt was in much better spirits and feels he will be transitioned to a regular room. Pt appreciated the visit and prayers from previous visit. CH is available for follow up as needed.    03/02/16 1400  Clinical Encounter Type  Visited With Patient;Patient and family together  Visit Type Follow-up

## 2016-03-02 NOTE — Progress Notes (Signed)
No new complaints No distress Comfortable on [place] O2 @ 5 LPM  CTAP 01/09 revealed consolidation in LLL  Vitals:   03/02/16 0500 03/02/16 0600 03/02/16 0800 03/02/16 1000  BP: (!) 81/63 103/68 103/68 (!) 101/58  Pulse: 76 94 73 88  Resp: (!) 22 (!) 30 (!) 24 (!) 28  Temp:   98.5 F (36.9 C)   TempSrc:   Oral   SpO2: 94% 94% 94% 91%  Weight:      Height:       NAD No focal deficits HEENT WNL No JVD Bronchial BS in LLL, no wheezes Abd soft, NT, +BS Ext without edema Thigh pain much improved  BMP Latest Ref Rng & Units 03/02/2016 03/01/2016 02/29/2016  Glucose 65 - 99 mg/dL 132(H) 138(H) 109(H)  BUN 6 - 20 mg/dL 27(H) 29(H) 33(H)  Creatinine 0.61 - 1.24 mg/dL 1.10 1.41(H) 1.49(H)  Sodium 135 - 145 mmol/L 138 137 137  Potassium 3.5 - 5.1 mmol/L 4.1 4.2 4.0  Chloride 101 - 111 mmol/L 110 110 104  CO2 22 - 32 mmol/L 22 19(L) 26  Calcium 8.9 - 10.3 mg/dL 8.8(L) 8.7(L) 9.8   CBC Latest Ref Rng & Units 03/02/2016 02/29/2016 06/02/2015  WBC 3.8 - 10.6 K/uL 3.5(L) 3.4(L) 3.3(L)  Hemoglobin 13.0 - 18.0 g/dL 12.2(L) 13.7 15.0  Hematocrit 40.0 - 52.0 % 35.5(L) 40.0 44.7  Platelets 150 - 440 K/uL 136(L) 131(L) 184   CTAP 01/09 revealed consolidation in LLL  CXR: NSC LLL consolidation  Urine strep antigen is POSITIVE  CK 1646 > 1399 > 604  PCT 15 > 14.5 > 8.5  IMPRESSION: Acute hypoxic respiratory failure LLL CAP - This is almost certainly pneumococcus AKI - resolving Myositis - resolving clinically and CK coming down  PLAN/REC 1) DC azithromycin 2) Continue ceftriaxone through today 3) Will likely be OK to change to levofloxacin 01/11 and complete 7 days abx 4) Cont supplemental O2 to maintain SpO2 > 90% 5) OK to transfer to med-surg floor  PCCM will sign off. Please call if we can be of further assistance  Merton Border, MD PCCM service Mobile [phone] Pager [phone] 03/02/2016

## 2016-03-02 NOTE — Progress Notes (Signed)
Patient ID: DANNER BUZZA, male   DOB: [birth year], 76 y.o.   MRN: [id number]  [facility] PROGRESS NOTE  ANTHON PLOTTS [id number] DOB: 1940-06-16 DOA: 02/29/2016 PCP: Ezequiel Kayser, MD  HPI/Subjective:  Admitted for weakness and bilateral pneumonia. On 4 L oxygen.  Up in bed eating. Still has cough and shortness of breath.  Objective: Vitals:   03/02/16 0600 03/02/16 0800  BP: 103/68 103/68  Pulse: 94 73  Resp: (!) 30 (!) 24  Temp:  98.5 F (36.9 C)    Filed Weights   02/29/16 1418 02/29/16 2100  Weight: 63.5 kg (140 lb) 65.8 kg (145 lb 1 oz)    ROS: Review of Systems  Constitutional: Negative for chills and fever.  HENT: Positive for sore throat.   Eyes: Negative for blurred vision.  Respiratory: Positive for cough and shortness of breath.   Cardiovascular: Negative for chest pain.  Gastrointestinal: Negative for abdominal pain, constipation, diarrhea, nausea and vomiting.  Genitourinary: Negative for dysuria.  Musculoskeletal: Negative for joint pain.  Neurological: Negative for dizziness and headaches.   Exam: Physical Exam  Constitutional: He is oriented to person, place, and time.  HENT:  Nose: No mucosal edema.  Mouth/Throat: No oropharyngeal exudate or posterior oropharyngeal edema.  Eyes: Conjunctivae, EOM and lids are normal. Pupils are equal, round, and reactive to light.  Neck: No JVD present. Carotid bruit is not present. No edema present. No thyroid mass and no thyromegaly present.  Cardiovascular: S1 normal and S2 normal.  Tachycardia present.  Exam reveals no gallop.   No murmur heard. Pulses:      Dorsalis pedis pulses are 2+ on the right side, and 2+ on the left side.  Respiratory: No respiratory distress. He has decreased breath sounds in the right middle field, the right lower field, the left middle field and the left lower field. He has no wheezes. He has rhonchi in the right middle field, the right lower field, the left middle field and the  left lower field. He has no rales.  GI: Soft. Bowel sounds are normal. There is no tenderness.  Musculoskeletal:       Right ankle: He exhibits no swelling.       Left ankle: He exhibits no swelling.  Lymphadenopathy:    He has no cervical adenopathy.  Neurological: He is alert and oriented to person, place, and time. No cranial nerve deficit.  Skin: Skin is warm. No rash noted. Nails show no clubbing.  Psychiatric: He has a normal mood and affect.      Data Reviewed: Basic Metabolic Panel:  Recent Labs Lab 02/29/16 1439 03/01/16 0432 03/02/16 0605  NA 137 137 138  K 4.0 4.2 4.1  CL 104 110 110  CO2 26 19* 22  GLUCOSE 109* 138* 132*  BUN 33* 29* 27*  CREATININE 1.49* 1.41* 1.10  CALCIUM 9.8 8.7* 8.8*   Liver Function Tests:  Recent Labs Lab 02/29/16 1439  AST 63*  ALT 28  ALKPHOS 52  BILITOT 1.2  PROT 6.6  ALBUMIN 3.6   CBC:  Recent Labs Lab 02/29/16 1439 03/02/16 0605  WBC 3.4* 3.5*  NEUTROABS 3.3  --   HGB 13.7 12.2*  HCT 40.0 35.5*  MCV 96.5 95.6  PLT 131* 136*   Cardiac Enzymes:  Recent Labs Lab 02/29/16 1439 02/29/16 1923 03/01/16 0939 03/02/16 0605  CKTOTAL  --  1,646* 1,399* 604*  TROPONINI <0.03  --   --   --     CBG:  Recent Labs Lab 02/29/16 2041 02/29/16 2200 03/01/16 0059 03/01/16 0433  GLUCAP 61* 111* 86 146*    Recent Results (from the past 240 hour(s))  Blood Culture (routine x 2)     Status: None (Preliminary result)   Collection Time: 02/29/16  2:39 PM  Result Value Ref Range Status   Specimen Description BLOOD LEFT FFA  Final   Special Requests   Final    BOTTLES DRAWN AEROBIC AND ANAEROBIC AER 3 ML ANA 3ML   Culture NO GROWTH < 24 HOURS  Final   Report Status PENDING  Incomplete  Blood Culture (routine x 2)     Status: None (Preliminary result)   Collection Time: 02/29/16  2:39 PM  Result Value Ref Range Status   Specimen Description BLOOD RT A  Final   Special Requests   Final    BOTTLES DRAWN AEROBIC AND  ANAEROBIC AER 3CC, ANA2CC   Culture NO GROWTH < 24 HOURS  Final   Report Status PENDING  Incomplete  Rapid Influenza A&B Antigens ([place] only)     Status: None   Collection Time: 02/29/16  2:39 PM  Result Value Ref Range Status   Influenza A ([place]) NEGATIVE NEGATIVE Final   Influenza B ([facility]) NEGATIVE NEGATIVE Final  Urine culture     Status: None   Collection Time: 02/29/16  5:08 PM  Result Value Ref Range Status   Specimen Description URINE, RANDOM  Final   Special Requests NONE  Final   Culture NO GROWTH Performed at [facility]   Final   Report Status 03/01/2016 FINAL  Final  MRSA PCR Screening     Status: None   Collection Time: 02/29/16  9:04 PM  Result Value Ref Range Status   MRSA by PCR NEGATIVE NEGATIVE Final    Comment:        The GeneXpert MRSA Assay (FDA approved for NASAL specimens only), is one component of a comprehensive MRSA colonization surveillance program. It is not intended to diagnose MRSA infection nor to guide or monitor treatment for MRSA infections.   Respiratory Panel by PCR     Status: None   Collection Time: 02/29/16 11:56 PM  Result Value Ref Range Status   Adenovirus NOT DETECTED NOT DETECTED Final   Coronavirus 229E NOT DETECTED NOT DETECTED Final   Coronavirus HKU1 NOT DETECTED NOT DETECTED Final   Coronavirus NL63 NOT DETECTED NOT DETECTED Final   Coronavirus OC43 NOT DETECTED NOT DETECTED Final   Metapneumovirus NOT DETECTED NOT DETECTED Final   Rhinovirus / Enterovirus NOT DETECTED NOT DETECTED Final   Influenza A NOT DETECTED NOT DETECTED Final   Influenza B NOT DETECTED NOT DETECTED Final   Parainfluenza Virus 1 NOT DETECTED NOT DETECTED Final   Parainfluenza Virus 2 NOT DETECTED NOT DETECTED Final   Parainfluenza Virus 3 NOT DETECTED NOT DETECTED Final   Parainfluenza Virus 4 NOT DETECTED NOT DETECTED Final   Respiratory Syncytial Virus NOT DETECTED NOT DETECTED Final   Bordetella pertussis NOT DETECTED NOT DETECTED Final    Chlamydophila pneumoniae NOT DETECTED NOT DETECTED Final   Mycoplasma pneumoniae NOT DETECTED NOT DETECTED Final    Comment: Performed at [facility]     Studies: Ct Head Wo Contrast  Result Date: 02/29/2016 CLINICAL DATA:  Fall, hit back of head. EXAM: CT HEAD WITHOUT CONTRAST TECHNIQUE: Contiguous axial images were obtained from the base of the skull through the vertex without intravenous contrast. COMPARISON:  MRI 12/26/2013 FINDINGS: Brain: No acute  intracranial abnormality. Specifically, no hemorrhage, hydrocephalus, mass lesion, acute infarction, or significant intracranial injury. Vascular: No hyperdense vessel or unexpected calcification. Skull: No acute calvarial abnormality. Sinuses/Orbits: Visualized paranasal sinuses and mastoids clear. Orbital soft tissues unremarkable. Other: None IMPRESSION: No acute intracranial abnormality. Electronically Signed   By: Rolm Baptise M.D.   On: 02/29/2016 15:02   Ct Cervical Spine Wo Contrast  Result Date: 02/29/2016 CLINICAL DATA:  Fall earlier today EXAM: CT CERVICAL SPINE WITHOUT CONTRAST TECHNIQUE: Multidetector CT imaging of the cervical spine was performed without intravenous contrast. Multiplanar CT image reconstructions were also generated. COMPARISON:  Head CT 02/29/2016 FINDINGS: Alignment: Straightening of the cervical spine. No subluxation. Facet alignment normal. Skull base and vertebrae: Craniovertebral junction is intact. Vertebral body heights are maintained. There is no fracture. Soft tissues and spinal canal: No prevertebral fluid or swelling. No visible canal hematoma. Disc levels: Severe degenerative disc changes at C3-C4, C4-C5, C5-C6 and C6-C7. Posterior disc osteophyte complex C3-C4, C4-C5, C5-C6 and C6-C7 results in mild to moderate canal stenosis. Multilevel bilateral foraminal stenosis as well. Upper chest: Left apical scarring or atelectasis. Image thyroid within normal limits. Other: None IMPRESSION: Straightening of the  cervical spine with severe degenerative disc changes from C4 through C7 ; multilevel bilateral foraminal stenosis and mild to moderate canal stenosis. No acute osseous abnormality. Electronically Signed   By: Donavan Foil M.D.   On: 02/29/2016 18:55   Dg Chest Port 1 View  Result Date: 03/02/2016 CLINICAL DATA:  Respiratory failure EXAM: PORTABLE CHEST 1 VIEW COMPARISON:  Portable chest x-ray of February 29, 2016 FINDINGS: The lungs are adequately inflated. The interstitial markings remain increased predominantly in the mid and lower lung zones bilaterally. The heart and pulmonary vascularity are normal. There is no pleural effusion. IMPRESSION: Persistent interstitial and early alveolar opacities in both lungs code most consistent with pneumonia. There has not been significant interval change since the study of 29 February 2016. Electronically Signed   By: David  Martinique M.D.   On: 03/02/2016 07:52   Dg Chest Port 1 View  Result Date: 03/01/2016 CLINICAL DATA:  Acute respiratory distress. EXAM: PORTABLE CHEST 1 VIEW COMPARISON:  02/29/2016 FINDINGS: Normal heart size and pulmonary vascularity. Since the previous study, there is increasing airspace disease involving the left mid and lower lung in the right mid lung. This suggest multifocal pneumonia. Possible small left pleural effusion. Tortuous aorta. Degenerative changes in the spine and left shoulder. Postoperative change in the right shoulder. IMPRESSION: Developing areas of infiltration/ consolidation in the right mid lung and left mid and lower lung zones suggesting developing multifocal pneumonia. Electronically Signed   By: Lucienne Capers M.D.   On: 03/01/2016 00:03   Dg Chest Port 1 View  Result Date: 02/29/2016 CLINICAL DATA:  Status post fall, weakness EXAM: PORTABLE CHEST 1 VIEW COMPARISON:  07/16/2009 FINDINGS: There is no focal parenchymal opacity. There is no pleural effusion or pneumothorax. The heart and mediastinal contours are  unremarkable. There is a right shoulder arthroplasty. There are degenerative changes of bilateral acromioclavicular joints. IMPRESSION: No active disease. Electronically Signed   By: Kathreen Devoid   On: 02/29/2016 15:10   Ct Renal Stone Study  Result Date: 03/01/2016 CLINICAL DATA:  Hematuria, cough EXAM: CT ABDOMEN AND PELVIS WITHOUT CONTRAST TECHNIQUE: Multidetector CT imaging of the abdomen and pelvis was performed following the standard protocol without IV contrast. COMPARISON:  None. FINDINGS: Lower chest: There is consolidation with air bronchogram bilateral lower lobe posteriorly left greater than right  suspicious for pneumonia. Hepatobiliary: Unenhanced liver shows no biliary ductal dilatation. No calcified gallstones are noted within gallbladder. There is a cyst in lateral segment of the left hepatic lobe measures 1.8 cm. Pancreas: Unenhanced pancreas without gross focal abnormality. Spleen: Unenhanced spleen with normal appearance. Adrenals/Urinary Tract: No adrenal gland mass. At least 4 nonobstructive calcified calculi are noted within an right kidney the largest in lower pole measures 6.3 mm. At least 5 nonobstructive calcified calculi are noted within left kidney the largest in midpole measures 8 mm. No hydronephrosis or hydroureter. No calcified ureteral calculi are noted bilaterally. Limited assessment of the urinary bladder which is decompressed with a Foley catheter. Small amount of air within urinary bladder is probable post instrumentation. Stomach/Bowel: No small bowel obstruction. No thickened or dilated small bowel loops. The terminal ileum is unremarkable. Moderate stool noted in right colon and cecum. No pericecal inflammation. Abundant stool noted in transverse colon. Moderate stool noted within splenic flexure of the colon and descending colon. Moderate stool are noted within redundant sigmoid colon. Moderate gas noted in mid sigmoid colon. Sigmoid colon diverticula are noted. There is  no evidence of acute colitis or diverticulitis. There is a these moderate stool and gas within rectum measures 6.8 cm in diameter. Mild fecal impaction cannot be excluded. Vascular/Lymphatic: Atherosclerotic calcifications of abdominal aorta and iliac arteries. No aortic aneurysm. No adenopathy. Reproductive: Prostate gland measures 5.9 by 4.3 cm. Other: No ascites or free abdominal air. Small hiatal hernia is noted. Musculoskeletal: No destructive bony lesions are noted. Sagittal images of the spine shows osteopenia and degenerative changes thoracolumbar spine IMPRESSION: 1. There is bilateral nonobstructive nephrolithiasis. No hydronephrosis or hydroureter. No calcified ureteral calculi. 2. Bilateral lung lower lobe posterior consolidation with air bronchogram left greater than right highly suspicious for pneumonia. Small hiatal hernia is noted. 3. Moderate stool noted within right colon and cecum. Abundant stool within transverse colon. Moderate stool noted descending colon proximal sigmoid colon. No evidence of acute colitis or diverticulitis. Moderate stool and gas noted within rectum. Mild fecal impaction cannot be excluded. 4. No small bowel obstruction. 5. There is a Foley catheter within decompressed urinary bladder. Small amount of air within urinary bladder probable post instrumentation. 6. Degenerative changes thoracolumbar spine. Electronically Signed   By: Lahoma Crocker M.D.   On: 03/01/2016 13:21    Scheduled Meds: . cefTRIAXone  1 g Intravenous Q24H  . enoxaparin (LOVENOX) injection  40 mg Subcutaneous Q24H  . lidocaine (PF)  5 mL Intradermal Once  . methylphenidate  20 mg Oral BID WC  . multivitamin-lutein  1 capsule Oral Q24H  . polyethylene glycol  17 g Oral Daily  . predniSONE  50 mg Oral Q breakfast  . tamsulosin  0.4 mg Oral QHS   Continuous Infusions:   Assessment/Plan:  1. Bilateral pneumonia with sepsis. On doxycycline and ceftriaxone. Urine streptococcal antigen positive.  Cultures no growth to date. Acute hypoxic respiratory failure due to pneumonia. Continue 4 L oxygen. Wean oxygen as tolerated. Nebulizers as needed. 2. Severe weakness from sepsis and traumatic rhabdomyolysis from fall. Continue IV fluid hydration. Cervical spine CT scan showed degenerative changes.Improving. CK level trending down 3. Urinary retention and hematuria. Patient had Foley catheter placed. Started Flomax . CT scan of the abdomen and pelvis renal stone protocol showed nothing acute 4. Bipolar disorder with severe depression. Appreciate psychiatry input 5. COPD exacerbation.  Start prednisone  Code Status:     Code Status Orders  Start     Ordered   03/01/16 1035  Full code  Continuous     03/01/16 1034    Code Status History    Date Active Date Inactive Code Status Order ID Comments User Context   This patient has a current code status but no historical code status.     Family Communication: Wife at the bedside Disposition Plan: To be determined  Consultants:  Critical care specialist  Neurology  Psychiatry  Antibiotics:  Rocephin  Zithromax  Time spent: 28 minutes  [place], [place]

## 2016-03-02 NOTE — Care Management (Signed)
Received call from patient's wife requesting home health services through [facility]. Home health referral made to [facility] with [facility].

## 2016-03-02 NOTE — Evaluation (Signed)
Physical Therapy Evaluation Patient Details Name: David [name] MRN: [id number] DOB: 17-Apr-1940 Today's Date: 03/02/2016   History of Present Illness  David [name]  is a 76 y.o. male with a known history of Bipolar disorder presents with severe weakness all extremities and he could hardly control his arms or legs today. He had total body weakness. He complains of a sore throat some cough and fever. Today he had a fall and hit his head. In the ER he had a fever of 102 and he felt very weak.  Clinical Impression    Pt presents to PT with generalized weakness, decreased balance, and increased O2 demands and would benefit from acute PT services to address objective findings.  Pt motivated to ambulate and to begin increasing strength.  Pt demonstrated decreased balance with ambulation and would benefit from gait training with RW.  Pt continues to require between 2-3 L O2 in order to maintain sats above 90% during activity.     Follow Up Recommendations Home health PT    Equipment Recommendations  Rolling walker with 5" wheels    Recommendations for Other Services       Precautions / Restrictions Precautions Precautions: Fall Restrictions Weight Bearing Restrictions: No      Mobility  Bed Mobility Overal bed mobility:  (Pt received up in recliner.)                Transfers Overall transfer level: Needs assistance Equipment used: 1 person hand held assist Transfers: Sit to/from Stand Sit to Stand: Min guard         General transfer comment: Lift off from chair with extra time/effort; initially unsteady upon standing taking correctional steps to maintain balance.  Ambulation/Gait Ambulation/Gait assistance: Min guard Ambulation Distance (Feet): 250 Feet Assistive device: 1 person hand held assist Gait Pattern/deviations: Step-through pattern Gait velocity: decreased Gait velocity interpretation: Below normal speed for age/gender General Gait Details: Generally  unsteady needing wall rail and hand held assist for guidance/safety.  Stairs            Wheelchair Mobility    Modified Rankin (Stroke Patients Only)       Balance Overall balance assessment: Needs assistance Sitting-balance support: Feet supported Sitting balance-Leahy Scale: Good     Standing balance support: Single extremity supported;During functional activity Standing balance-Leahy Scale: Fair                               Pertinent Vitals/Pain Pain Assessment: No/denies pain    Home Living Family/patient expects to be discharged to:: Private residence Living Arrangements: Spouse/significant other   Type of Home: House Home Access: Stairs to enter Entrance Stairs-Rails: Left Entrance Stairs-Number of Steps: 5 Home Layout: One level Home Equipment: Cane - single point      Prior Function Level of Independence: Independent         Comments: Walks dog 1 mi per day, stopped going to exercise class about 1 year ago, drives/shops.     Hand Dominance        Extremity/Trunk Assessment   Upper Extremity Assessment Upper Extremity Assessment: Generalized weakness    Lower Extremity Assessment Lower Extremity Assessment: Generalized weakness    Cervical / Trunk Assessment Cervical / Trunk Assessment: Normal  Communication   Communication: No difficulties  Cognition   Behavior During Therapy: WFL for tasks assessed/performed Overall Cognitive Status: Within Functional Limits for tasks assessed  General Comments: A/O x4, plesant and following commands    General Comments General comments (skin integrity, edema, etc.): 5L O2 at beginning of session; able to titrate down to 3L with RN maintaning sats between 91-95%    Exercises Other Exercises Other Exercises: Ambulation 100' on room air with CGA, then 150' on 2 L O2   Assessment/Plan    PT Assessment Patient needs continued PT services  PT Problem List  Decreased strength;Decreased activity tolerance;Decreased balance;Decreased mobility;Cardiopulmonary status limiting activity          PT Treatment Interventions Gait training;Stair training;Functional mobility training;Therapeutic activities;Therapeutic exercise;Balance training    PT Goals (Current goals can be found in the Care Plan section)  Acute Rehab PT Goals Patient Stated Goal: To return to going to exercise class with his wife. PT Goal Formulation: With patient Time For Goal Achievement: 03/16/16 Potential to Achieve Goals: Good    Frequency Min 2X/week   Barriers to discharge        Co-evaluation               End of Session Equipment Utilized During Treatment: Gait belt;Oxygen Activity Tolerance: Patient tolerated treatment well Patient left: in chair;with call bell/phone within reach Nurse Communication: Mobility status         Time: [id number] PT Time Calculation (min) (ACUTE ONLY): 40 min   Charges:   PT Evaluation $PT Eval Moderate Complexity: 1 Procedure PT Treatments $Therapeutic Exercise: 8-22 mins   PT G Codes:        David [name], PT 03/02/2016, 11:39 AM

## 2016-03-02 NOTE — Consult Note (Signed)
[facility] Face-to-Face Psychiatry Consult   Reason for Consult:  Consult for this 76 year old man currently in the hospital for weakness and a recent fall. Question raised about depression Referring Physician:  Manuella [name] Patient Identification: David [name] MRN:  [id number] Principal Diagnosis: Bipolar disorder with moderate depression ([place]) Diagnosis:   Patient Active Problem List   Diagnosis Date Noted  . Community acquired pneumonia of left lower lobe of lung ([place]) [J18.1]   . Pneumonia of left lower lobe due to Streptococcus pneumoniae ([place]) [J13]   . Acute respiratory failure ([place]) [J96.00]   . Bipolar disorder with moderate depression ([place]) [F31.32] 03/01/2016  . Hypotension [I95.9] 02/29/2016  . IDA (iron deficiency anemia) [D50.9] 06/19/2014  . Leucopenia [D72.819] 06/19/2014    Total Time spent with patient: 15 minutes  Subjective:   David [name] is a 76 y.o. male patient admitted with "I'm depressed".  Follow-up Wednesday the 10th. Patient seen. I also spoke with his outpatient psychiatrist today. Confirmed with her that he has had little response despite multiple medication trials and even ECT. She did not have any suggestion of any specific change to make to medication at this point. Patient himself was out of bed and was actually smiling and seemed to be in reasonably good spirits when I came by. No complaints of anything new or different than previously.  HPI:  Information obtained from the patient and the chart. Also reviewed psychiatric notes from his physician at [facility]. Also with his permission spoke with his wife. Patient and wife both agree on current symptoms that the patient has been depressed for several years. Not necessarily getting worse but has been stably feeling bad for a few years. Patient emphasizes how he has no motivation and doesn't want to socialize or do very much. His sleep is adequate. Appetite is adequate. He denies having any suicidal ideation. He denies any  psychotic symptoms. He is currently taking methylphenidate as his only psychiatric medicine. He has been through multiple other medicines without any clear benefit. Wife is clearly very frustrated with his illness that he remains withdrawn and complains of such a severe lack of energy that he can barely get out of bed much of the time.  Social history: Patient is a retired Hotel manager. He and his wife live together. They have adult children. Sounds like there is a fundamentally stable relationship.  Medical history: Patient has chronic leukopenia which has been commented on extensively. Iron deficiency anemia currently with hypotension and a fall.  Substance abuse history: Patient denies that he drinks and denies any past history of alcohol or drug abuse  Past Psychiatric History: Patient has been treated for bipolar disorder for years. His wife reports that when he was still working he tended to have what sounds like more hypomanic episodes. The patient himself didn't even see these as being pathologic. The depression has been severe for the past 3 years. He's had at least 1 hospitalization at [facility] for the sake of having ECT treatment (which didn't work). No history of suicide attempts violence or psychosis patient reportedly failed multiple antidepressants because they made him agitated. Multiple mood stabilizers have been over sedating and intolerable. Lithium appeared not to of been very beneficial and the current methylphenidate also seems to provide only slight improvement in his energy.  Risk to Self: Is patient at risk for suicide?: No Risk to Others:   Prior Inpatient Therapy:   Prior Outpatient Therapy:    Past Medical History:  Past Medical History:  Diagnosis Date  . Allergic rhinitis   . Anemia   . Bipolar disorder ([place])   . Elevated PSA    had biopsy at [facility] around 2009-2010 negative for malignancy  . Erectile dysfunction   . GERD (gastroesophageal reflux disease)   . Leucopenia    . Osteoarthritis    in hands  . Prostatitis   . Sigmoid polyp     Past Surgical History:  Procedure Laterality Date  . COLONOSCOPY  10/20/12  . HERNIA REPAIR    . Left knee replacement    . Right shoulder replacement     Family History:  Family History  Problem Relation Age of Onset  . CVA Mother   . CAD Father    Family Psychiatric  History: No known family history Social History:  History  Alcohol Use No     History  Drug use: Unknown    Social History   Social History  . Marital status: Married    Spouse name: N/A  . Number of children: N/A  . Years of education: N/A   Social History Main Topics  . Smoking status: Former Smoker    Types: Cigarettes    Quit date: 02/22/1975  . Smokeless tobacco: Never Used  . Alcohol use No  . Drug use: Unknown  . Sexual activity: Not Asked   Other Topics Concern  . None   Social History Narrative  . None   Additional Social History:    Allergies:   Allergies  Allergen Reactions  . Quetiapine Anxiety    Restlessness, worsening anxiety  Restlessness, worsening anxiety     Labs:  Results for orders placed or performed during the hospital encounter of 02/29/16 (from the past 48 hour(s))  Urine culture     Status: None   Collection Time: 02/29/16  5:08 PM  Result Value Ref Range   Specimen Description URINE, RANDOM    Special Requests NONE    Culture NO GROWTH Performed at [facility]     Report Status 03/01/2016 FINAL   Urinalysis, Complete w Microscopic     Status: Abnormal   Collection Time: 02/29/16  5:08 PM  Result Value Ref Range   Color, Urine YELLOW (A) YELLOW   APPearance CLEAR (A) CLEAR   Specific Gravity, Urine 1.020 1.005 - 1.030   pH 6.0 5.0 - 8.0   Glucose, UA NEGATIVE NEGATIVE mg/dL   Hgb urine dipstick LARGE (A) NEGATIVE   Bilirubin Urine NEGATIVE NEGATIVE   Ketones, ur NEGATIVE NEGATIVE mg/dL   Protein, ur 100 (A) NEGATIVE mg/dL   Nitrite NEGATIVE NEGATIVE   Leukocytes, UA  NEGATIVE NEGATIVE   RBC / HPF TOO NUMEROUS TO COUNT 0 - 5 RBC/hpf   WBC, UA 0-5 0 - 5 WBC/hpf   Bacteria, UA NONE SEEN NONE SEEN   Squamous Epithelial / LPF NONE SEEN NONE SEEN   Mucous PRESENT   Urine Drug Screen, Qualitative ([facility] only)     Status: None   Collection Time: 02/29/16  5:08 PM  Result Value Ref Range   Tricyclic, Ur Screen NONE DETECTED NONE DETECTED   Amphetamines, Ur Screen NONE DETECTED NONE DETECTED   MDMA (Ecstasy)Ur Screen NONE DETECTED NONE DETECTED   Cocaine Metabolite,Ur [place] NONE DETECTED NONE DETECTED   Opiate, Ur Screen NONE DETECTED NONE DETECTED   Phencyclidine (PCP) Ur S NONE DETECTED NONE DETECTED   Cannabinoid 50 Ng, Ur Anthony NONE DETECTED NONE DETECTED   Barbiturates, Ur Screen NONE DETECTED NONE DETECTED  Benzodiazepine, Ur Scrn NONE DETECTED NONE DETECTED   Methadone Scn, Ur NONE DETECTED NONE DETECTED    Comment: (NOTE) 654  Tricyclics, urine               Cutoff 1000 ng/mL 200  Amphetamines, urine             Cutoff 1000 ng/mL 300  MDMA (Ecstasy), urine           Cutoff 500 ng/mL 400  Cocaine Metabolite, urine       Cutoff 300 ng/mL 500  Opiate, urine                   Cutoff 300 ng/mL 600  Phencyclidine (PCP), urine      Cutoff 25 ng/mL 700  Cannabinoid, urine              Cutoff 50 ng/mL 800  Barbiturates, urine             Cutoff 200 ng/mL 900  Benzodiazepine, urine           Cutoff 200 ng/mL 1000 Methadone, urine                Cutoff 300 ng/mL 1100 1200 The urine drug screen provides only a preliminary, unconfirmed 1300 analytical test result and should not be used for non-medical 1400 purposes. Clinical consideration and professional judgment should 1500 be applied to any positive drug screen result due to possible 1600 interfering substances. A more specific alternate chemical method 1700 must be used in order to obtain a confirmed analytical result.  1800 Gas chromato graphy / mass spectrometry (GC/MS) is the preferred 1900 confirmatory  method.   Lactic acid, plasma     Status: Abnormal   Collection Time: 02/29/16  5:09 PM  Result Value Ref Range   Lactic Acid, Venous 2.1 (HH) 0.5 - 1.9 mmol/L    Comment: CRITICAL RESULT CALLED TO, READ BACK BY AND VERIFIED WITH LINDA MCLAMB @ 1826 02/29/16 BY [facility]   CK     Status: Abnormal   Collection Time: 02/29/16  7:23 PM  Result Value Ref Range   Total CK 1,646 (H) 49 - 397 U/L  ANA w/Reflex if Positive     Status: None   Collection Time: 02/29/16  7:23 PM  Result Value Ref Range   Anit Nuclear Antibody(ANA) Negative Negative    Comment: (NOTE) Performed At: [facility] [place], [place] [id number] Lindon Romp MD CL:[id number]   Sedimentation rate     Status: Abnormal   Collection Time: 02/29/16  7:23 PM  Result Value Ref Range   Sed Rate 49 (H) 0 - 20 mm/hr  Lithium level     Status: Abnormal   Collection Time: 02/29/16  7:23 PM  Result Value Ref Range   Lithium Lvl <0.06 (L) 0.60 - 1.20 mmol/L  Glucose, capillary     Status: Abnormal   Collection Time: 02/29/16  8:41 PM  Result Value Ref Range   Glucose-Capillary 61 (L) 65 - 99 mg/dL  Procalcitonin - Baseline     Status: None   Collection Time: 02/29/16  8:53 PM  Result Value Ref Range   Procalcitonin 15.22 ng/mL    Comment:        Interpretation: PCT >= 10 ng/mL: Important systemic inflammatory response, almost exclusively due to severe bacterial sepsis or septic shock. (NOTE)         ICU PCT Algorithm  Non ICU PCT Algorithm    ----------------------------     ------------------------------         PCT < 0.25 ng/mL                 PCT < 0.1 ng/mL     Stopping of antibiotics            Stopping of antibiotics       strongly encouraged.               strongly encouraged.    ----------------------------     ------------------------------       PCT level decrease by               PCT < 0.25 ng/mL       >= 80% from peak PCT       OR PCT 0.25 - 0.5 ng/mL          Stopping  of antibiotics                                             encouraged.     Stopping of antibiotics           encouraged.    ----------------------------     ------------------------------       PCT level decrease by              PCT >= 0.25 ng/mL       < 80% from peak PCT        AND PCT >= 0.5 ng/mL             Continuing antibiotics                                              encouraged.       Continuing antibiotics            encouraged.    ----------------------------     ------------------------------     PCT level increase compared          PCT > 0.5 ng/mL         with peak PCT AND          PCT >= 0.5 ng/mL             Escalation of antibiotics                                          strongly encouraged.      Escalation of antibiotics        strongly encouraged.   Lactic acid, plasma     Status: None   Collection Time: 02/29/16  8:53 PM  Result Value Ref Range   Lactic Acid, Venous 1.4 0.5 - 1.9 mmol/L  MRSA PCR Screening     Status: None   Collection Time: 02/29/16  9:04 PM  Result Value Ref Range   MRSA by PCR NEGATIVE NEGATIVE    Comment:        The GeneXpert MRSA Assay (FDA approved for NASAL specimens only), is one component of a comprehensive MRSA colonization surveillance program. It is not intended to diagnose MRSA infection nor to  guide or monitor treatment for MRSA infections.   Glucose, capillary     Status: Abnormal   Collection Time: 02/29/16 10:00 PM  Result Value Ref Range   Glucose-Capillary 111 (H) 65 - 99 mg/dL  Respiratory Panel by PCR     Status: None   Collection Time: 02/29/16 11:56 PM  Result Value Ref Range   Adenovirus NOT DETECTED NOT DETECTED   Coronavirus 229E NOT DETECTED NOT DETECTED   Coronavirus HKU1 NOT DETECTED NOT DETECTED   Coronavirus NL63 NOT DETECTED NOT DETECTED   Coronavirus OC43 NOT DETECTED NOT DETECTED   Metapneumovirus NOT DETECTED NOT DETECTED   Rhinovirus / Enterovirus NOT DETECTED NOT DETECTED   Influenza A  NOT DETECTED NOT DETECTED   Influenza B NOT DETECTED NOT DETECTED   Parainfluenza Virus 1 NOT DETECTED NOT DETECTED   Parainfluenza Virus 2 NOT DETECTED NOT DETECTED   Parainfluenza Virus 3 NOT DETECTED NOT DETECTED   Parainfluenza Virus 4 NOT DETECTED NOT DETECTED   Respiratory Syncytial Virus NOT DETECTED NOT DETECTED   Bordetella pertussis NOT DETECTED NOT DETECTED   Chlamydophila pneumoniae NOT DETECTED NOT DETECTED   Mycoplasma pneumoniae NOT DETECTED NOT DETECTED    Comment: Performed at [facility]  Glucose, capillary     Status: None   Collection Time: 03/01/16 12:59 AM  Result Value Ref Range   Glucose-Capillary 86 65 - 99 mg/dL  Procalcitonin     Status: None   Collection Time: 03/01/16  4:32 AM  Result Value Ref Range   Procalcitonin 14.55 ng/mL    Comment:        Interpretation: PCT >= 10 ng/mL: Important systemic inflammatory response, almost exclusively due to severe bacterial sepsis or septic shock. (NOTE)         ICU PCT Algorithm               Non ICU PCT Algorithm    ----------------------------     ------------------------------         PCT < 0.25 ng/mL                 PCT < 0.1 ng/mL     Stopping of antibiotics            Stopping of antibiotics       strongly encouraged.               strongly encouraged.    ----------------------------     ------------------------------       PCT level decrease by               PCT < 0.25 ng/mL       >= 80% from peak PCT       OR PCT 0.25 - 0.5 ng/mL          Stopping of antibiotics                                             encouraged.     Stopping of antibiotics           encouraged.    ----------------------------     ------------------------------       PCT level decrease by              PCT >= 0.25 ng/mL       < 80% from peak PCT  AND PCT >= 0.5 ng/mL             Continuing antibiotics                                              encouraged.       Continuing antibiotics            encouraged.     ----------------------------     ------------------------------     PCT level increase compared          PCT > 0.5 ng/mL         with peak PCT AND          PCT >= 0.5 ng/mL             Escalation of antibiotics                                          strongly encouraged.      Escalation of antibiotics        strongly encouraged.   Basic metabolic panel     Status: Abnormal   Collection Time: 03/01/16  4:32 AM  Result Value Ref Range   Sodium 137 135 - 145 mmol/L   Potassium 4.2 3.5 - 5.1 mmol/L   Chloride 110 101 - 111 mmol/L   CO2 19 (L) 22 - 32 mmol/L   Glucose, Bld 138 (H) 65 - 99 mg/dL   BUN 29 (H) 6 - 20 mg/dL   Creatinine, Ser 1.41 (H) 0.61 - 1.24 mg/dL   Calcium 8.7 (L) 8.9 - 10.3 mg/dL   GFR calc non Af Amer 47 (L) >60 mL/min   GFR calc Af Amer 54 (L) >60 mL/min    Comment: (NOTE) The eGFR has been calculated using the CKD EPI equation. This calculation has not been validated in all clinical situations. eGFR's persistently <60 mL/min signify possible Chronic Kidney Disease.    Anion gap 8 5 - 15  Glucose, capillary     Status: Abnormal   Collection Time: 03/01/16  4:33 AM  Result Value Ref Range   Glucose-Capillary 146 (H) 65 - 99 mg/dL  CK     Status: Abnormal   Collection Time: 03/01/16  9:39 AM  Result Value Ref Range   Total CK 1,399 (H) 49 - 397 U/L  Strep pneumoniae urinary antigen     Status: Abnormal   Collection Time: 03/01/16  2:52 PM  Result Value Ref Range   Strep Pneumo Urinary Antigen POSITIVE (A) NEGATIVE    Comment: Performed at [facility]  Procalcitonin     Status: None   Collection Time: 03/02/16  6:05 AM  Result Value Ref Range   Procalcitonin 8.52 ng/mL    Comment:        Interpretation: PCT > 2 ng/mL: Systemic infection (sepsis) is likely, unless other causes are known. (NOTE)         ICU PCT Algorithm               Non ICU PCT Algorithm    ----------------------------     ------------------------------         PCT < 0.25  ng/mL                 PCT <  0.1 ng/mL     Stopping of antibiotics            Stopping of antibiotics       strongly encouraged.               strongly encouraged.    ----------------------------     ------------------------------       PCT level decrease by               PCT < 0.25 ng/mL       >= 80% from peak PCT       OR PCT 0.25 - 0.5 ng/mL          Stopping of antibiotics                                             encouraged.     Stopping of antibiotics           encouraged.    ----------------------------     ------------------------------       PCT level decrease by              PCT >= 0.25 ng/mL       < 80% from peak PCT        AND PCT >= 0.5 ng/mL            Continuing antibiotics                                               encouraged.       Continuing antibiotics            encouraged.    ----------------------------     ------------------------------     PCT level increase compared          PCT > 0.5 ng/mL         with peak PCT AND          PCT >= 0.5 ng/mL             Escalation of antibiotics                                          strongly encouraged.      Escalation of antibiotics        strongly encouraged.   Cortisol-am, blood     Status: Abnormal   Collection Time: 03/02/16  6:05 AM  Result Value Ref Range   Cortisol - AM 3.4 (L) 6.7 - 22.6 ug/dL    Comment: Performed at [facility] metabolic panel     Status: Abnormal   Collection Time: 03/02/16  6:05 AM  Result Value Ref Range   Sodium 138 135 - 145 mmol/L   Potassium 4.1 3.5 - 5.1 mmol/L   Chloride 110 101 - 111 mmol/L   CO2 22 22 - 32 mmol/L   Glucose, Bld 132 (H) 65 - 99 mg/dL   BUN 27 (H) 6 - 20 mg/dL   Creatinine, Ser 1.10 0.61 - 1.24 mg/dL   Calcium 8.8 (L) 8.9 - 10.3 mg/dL   GFR calc non Af Amer >60 >60 mL/min   GFR calc  Af Amer >60 >60 mL/min    Comment: (NOTE) The eGFR has been calculated using the CKD EPI equation. This calculation has not been validated in all clinical  situations. eGFR's persistently <60 mL/min signify possible Chronic Kidney Disease.    Anion gap 6 5 - 15  CK     Status: Abnormal   Collection Time: 03/02/16  6:05 AM  Result Value Ref Range   Total CK 604 (H) 49 - 397 U/L  CBC     Status: Abnormal   Collection Time: 03/02/16  6:05 AM  Result Value Ref Range   WBC 3.5 (L) 3.8 - 10.6 K/uL   RBC 3.71 (L) 4.40 - 5.90 MIL/uL   Hemoglobin 12.2 (L) 13.0 - 18.0 g/dL   HCT 35.5 (L) 40.0 - 52.0 %   MCV 95.6 80.0 - 100.0 fL   MCH 32.9 26.0 - 34.0 pg   MCHC 34.5 32.0 - 36.0 g/dL   RDW 14.2 11.5 - 14.5 %   Platelets 136 (L) 150 - 440 K/uL    Current Facility-Administered Medications  Medication Dose Route Frequency Provider Last Rate Last Dose  . acetaminophen (TYLENOL) tablet 500 mg  500 mg Oral Q8H PRN Loletha Grayer, MD   500 mg at 03/01/16 0107  . cefTRIAXone (ROCEPHIN) IVPB 1 g  1 g Intravenous Q24H Darylene Price [place], RPH   1 g at 03/01/16 1827  . enoxaparin (LOVENOX) injection 40 mg  40 mg Subcutaneous Q24H Wilhelmina Mcardle, MD   40 mg at 03/01/16 2005  . ipratropium-albuterol (DUONEB) 0.5-2.5 (3) MG/3ML nebulizer solution 3 mL  3 mL Nebulization Q4H PRN Wilhelmina Mcardle, MD      . lidocaine (PF) (XYLOCAINE) 1 % injection 5 mL  5 mL Intradermal Once Nance Pear, MD      . methylphenidate (RITALIN) tablet 20 mg  20 mg Oral BID WC Loletha Grayer, MD   20 mg at 03/02/16 1222  . multivitamin-lutein (OCUVITE-LUTEIN) capsule 1 capsule  1 capsule Oral Q24H Max Sane, MD   1 capsule at 03/02/16 1222  . polyethylene glycol (MIRALAX / GLYCOLAX) packet 17 g  17 g Oral Daily Loletha Grayer, MD   17 g at 03/02/16 1001  . predniSONE (DELTASONE) tablet 50 mg  50 mg Oral Q breakfast Srikar Sudini, MD   50 mg at 03/02/16 1222  . tamsulosin (FLOMAX) capsule 0.4 mg  0.4 mg Oral QHS Loletha Grayer, MD   0.4 mg at 03/01/16 2224    Musculoskeletal: Strength & Muscle Tone: within normal limits Gait & Station: unable to stand Patient leans:  Backward  Psychiatric Specialty Exam: Physical Exam  Nursing note and vitals reviewed. Constitutional: He appears well-developed and well-nourished.  HENT:  Head: Normocephalic and atraumatic.  Eyes: Conjunctivae are normal. Pupils are equal, round, and reactive to light.  Neck: Normal range of motion.  Cardiovascular: Regular rhythm and normal heart sounds.   Respiratory: Effort normal. No respiratory distress.  GI: Soft.  Musculoskeletal: Normal range of motion.  Neurological: He is alert.  Skin: Skin is warm and dry.  Psychiatric: His speech is normal and behavior is normal. Judgment and thought content normal. Cognition and memory are normal. He exhibits a depressed mood. He expresses no suicidal ideation.    Review of Systems  HENT: Negative.   Eyes: Negative.   Respiratory: Negative.   Cardiovascular: Negative.   Gastrointestinal: Negative.   Musculoskeletal: Negative.   Skin: Negative.   Neurological: Positive for weakness.  Psychiatric/Behavioral: Positive for  depression. Negative for hallucinations, memory loss, substance abuse and suicidal ideas. The patient is nervous/anxious and has insomnia.     Blood pressure 101/63, pulse 81, temperature 98.1 F (36.7 C), temperature source Axillary, resp. rate (!) 22, height '5\' 9"'$  (1.753 m), weight 65.8 kg (145 lb 1 oz), SpO2 96 %.Body mass index is 21.42 kg/m.  General Appearance: Fairly Groomed  Eye Contact:  Fair  Speech:  Clear and Coherent  Volume:  Normal  Mood:  Depressed  Affect:  Constricted  Thought Process:  Goal Directed  Orientation:  Full (Time, Place, and Person)  Thought Content:  Logical  Suicidal Thoughts:  No  Homicidal Thoughts:  No  Memory:  Immediate;   Good Recent;   Good Remote;   Fair  Judgement:  Impaired  Insight:  Shallow  Psychomotor Activity:  Decreased  Concentration:  Concentration: Fair  Recall:  [employer] of Knowledge:  Fair  Language:  Fair  Akathisia:  No  Handed:  Right  AIMS  (if indicated):     Assets:  Communication Skills Desire for Improvement Financial Resources/Insurance Housing Resilience Social Support  ADL's:  Impaired  Cognition:  WNL  Sleep:        Treatment Plan Summary: Plan No change to medication. I would continue the methylphenidate which his outpatient doctor thinks has been at least slightly helpful. No addition of any other psychiatric medicine. He has a follow-up appointment in the community in mid February. I will not follow-up at this point unless specifically needed.  Disposition: Patient does not meet criteria for psychiatric inpatient admission. Supportive therapy provided about ongoing stressors.  Alethia Berthold, MD 03/02/2016 3:42 PM

## 2016-03-03 LAB — CBC
HEMATOCRIT: 36.3 % — AB (ref 40.0–52.0)
Hemoglobin: 12.4 g/dL — ABNORMAL LOW (ref 13.0–18.0)
MCH: 32.7 pg (ref 26.0–34.0)
MCHC: 34.2 g/dL (ref 32.0–36.0)
MCV: 95.6 fL (ref 80.0–100.0)
Platelets: 154 10*3/uL (ref 150–440)
RBC: 3.8 MIL/uL — ABNORMAL LOW (ref 4.40–5.90)
RDW: 14 % (ref 11.5–14.5)
WBC: 3 10*3/uL — ABNORMAL LOW (ref 3.8–10.6)

## 2016-03-03 LAB — LEGIONELLA PNEUMOPHILA SEROGP 1 UR AG: L. pneumophila Serogp 1 Ur Ag: NEGATIVE

## 2016-03-03 LAB — BASIC METABOLIC PANEL
ANION GAP: 6 (ref 5–15)
BUN: 35 mg/dL — ABNORMAL HIGH (ref 6–20)
CO2: 23 mmol/L (ref 22–32)
Calcium: 9.1 mg/dL (ref 8.9–10.3)
Chloride: 110 mmol/L (ref 101–111)
Creatinine, Ser: 0.97 mg/dL (ref 0.61–1.24)
GLUCOSE: 105 mg/dL — AB (ref 65–99)
Potassium: 4.1 mmol/L (ref 3.5–5.1)
Sodium: 139 mmol/L (ref 135–145)

## 2016-03-03 LAB — CK: Total CK: 587 U/L — ABNORMAL HIGH (ref 49–397)

## 2016-03-03 MED ORDER — TAMSULOSIN HCL 0.4 MG PO CAPS: 0.4000 mg | ORAL_CAPSULE | Freq: Every day | ORAL | 0 refills | Status: DC

## 2016-03-03 MED ORDER — LEVOFLOXACIN 500 MG PO TABS: 500.0000 mg | ORAL_TABLET | Freq: Every day | ORAL | 0 refills | Status: DC

## 2016-03-03 MED ORDER — DOCUSATE SODIUM 100 MG PO CAPS: 100.0000 mg | ORAL_CAPSULE | Freq: Every day | ORAL | 0 refills | Status: DC

## 2016-03-03 NOTE — Care Management Important Message (Signed)
Important Message  Patient Details  Name: David [name] MRN: [id number] Date of Birth: Sep 08, 1940   Medicare Important Message Given:  Yes    Jolly Mango, RN 03/03/2016, 2:25 PM

## 2016-03-03 NOTE — Discharge Instructions (Signed)
Resume diet and activity as before ° ° °

## 2016-03-03 NOTE — Progress Notes (Signed)
DISCHARGE NOTE-  Pt given discharge instuctions and prescriptions (escript). Pt verbalized understanding. Pt wheeled to car by staff member, home walker sent with pt)

## 2016-03-03 NOTE — Care Management Note (Addendum)
Case Management Note  Patient Details  Name: David [name] MRN: [id number] Date of Birth: 1940-05-07  Subjective/Objective:   Discharging today. Case discussed with attending. He does not feel patient needs a SN. PT only. Greater than 93% on RA. No home O2. Advanced notified of discharge and will deliver the walker.                 Action/Plan: HHPT  Expected Discharge Date:   03/03/2016               Expected Discharge Plan:     In-House Referral:     Discharge planning Services  CM Consult  Post Acute Care Choice:  Home Health, Durable Medical Equipment Choice offered to:  Spouse, Patient  DME Arranged:  Walker rolling DME Agency:  [place]:  PT [facility] Agency:  [place]  Status of Service:  Completed, signed off  If discussed at [place] of Stay Meetings, dates discussed:    Additional Comments:  Jolly Mango, RN 03/03/2016, 11:45 AM

## 2016-03-05 LAB — CULTURE, BLOOD (ROUTINE X 2)
CULTURE: NO GROWTH
CULTURE: NO GROWTH

## 2016-03-07 DIAGNOSIS — J13 Pneumonia due to Streptococcus pneumoniae: Secondary | ICD-10-CM | POA: Diagnosis not present

## 2016-03-07 DIAGNOSIS — Z9181 History of falling: Secondary | ICD-10-CM | POA: Diagnosis not present

## 2016-03-07 DIAGNOSIS — D649 Anemia, unspecified: Secondary | ICD-10-CM | POA: Diagnosis not present

## 2016-03-07 DIAGNOSIS — K219 Gastro-esophageal reflux disease without esophagitis: Secondary | ICD-10-CM | POA: Diagnosis not present

## 2016-03-07 DIAGNOSIS — F3132 Bipolar disorder, current episode depressed, moderate: Secondary | ICD-10-CM | POA: Diagnosis not present

## 2016-03-09 NOTE — Discharge Summary (Signed)
David [name] at [place] NAME: [name]    MR#:  [id number]  DATE OF BIRTH:  1940/12/31  DATE OF ADMISSION:  02/29/2016 ADMITTING PHYSICIAN: Loletha Grayer, MD  DATE OF DISCHARGE: 03/03/2016  3:08 PM  PRIMARY CARE PHYSICIAN: THIES, DAVID, MD   ADMISSION DIAGNOSIS:  Weakness [R53.1] Elevated lactic acid level [R79.89] Fever, unspecified fever cause [R50.9]  DISCHARGE DIAGNOSIS:  Principal Problem:   Bipolar disorder with moderate depression ([place]) Active Problems:   Hypotension   Community acquired pneumonia of left lower lobe of lung ([place])   Pneumonia of left lower lobe due to Streptococcus pneumoniae ([place])   Acute respiratory failure ([place])   SECONDARY DIAGNOSIS:   Past Medical History:  Diagnosis Date  . Allergic rhinitis   . Anemia   . Bipolar disorder ([place])   . Elevated PSA    had biopsy at [facility] around 2009-2010 negative for malignancy  . Erectile dysfunction   . GERD (gastroesophageal reflux disease)   . Leucopenia   . Osteoarthritis    in hands  . Prostatitis   . Sigmoid polyp      ADMITTING HISTORY  HISTORY OF PRESENT ILLNESS:  David [name]  is a 76 y.o. male with a known history of Bipolar disorder presents with severe weakness all extremities and he could hardly control his arms or legs today. He had total body weakness. He complains of a sore throat some cough and fever. Today he had a fall and hit his head. In the ER he had a fever of 102 and he felt very weak.   HOSPITAL COURSE:   1. Bilateral pneumonia with sepsis. On doxycycline and ceftriaxone. Urine streptococcal antigen positive. Switch to Levaquin. Acute hypoxic respiratory failure has resolved by day of discharge. Saturations 96% on room air. 2. Severe weakness from sepsis and traumatic rhabdomyolysis from fall. Continued IV fluid hydration. Cervical spine CT scan showed degenerative changes.Improving. CK level trending down. No acute kidney  injury 3. Urinary retention and hematuria. Patient had Foley catheter placed. Started Flomax . CT scan of the abdomen and pelvis renal stone protocol showed nothing acute. Foley catheter removed one day prior to discharge and patient was voiding normally. 4. Bipolar disorder with severe depression. Appreciate psychiatry input. No new medications. 5. COPD exacerbation.  Started prednisone. Prescription given for discharge. 6. Stable for discharge home. Home health set up.  CONSULTS OBTAINED:  Treatment Team:  Gonzella Lex, MD  DRUG ALLERGIES:   Allergies  Allergen Reactions  . Quetiapine Anxiety    Restlessness, worsening anxiety  Restlessness, worsening anxiety     DISCHARGE MEDICATIONS:   Discharge Medication List as of 03/03/2016 11:24 AM    START taking these medications   Details  docusate sodium (COLACE) 100 MG capsule Take 1 capsule (100 mg total) by mouth daily., Starting Thu 03/03/2016, Normal    levofloxacin (LEVAQUIN) 500 MG tablet Take 1 tablet (500 mg total) by mouth daily., Starting Thu 03/03/2016, Normal    tamsulosin (FLOMAX) 0.4 MG CAPS capsule Take 1 capsule (0.4 mg total) by mouth at bedtime., Starting Thu 03/03/2016, Normal      CONTINUE these medications which have NOT CHANGED   Details  acetaminophen (TYLENOL) 500 MG tablet Take 500 mg by mouth every 8 (eight) hours as needed for moderate pain. , Historical Med    methylphenidate (RITALIN) 20 MG tablet Take 20 mg by mouth 2 (two) times daily., Historical Med    Multiple Vitamin (MULTIVITAMIN) tablet  Take 1 tablet by mouth daily., Historical Med    multivitamin-lutein (OCUVITE-LUTEIN) CAPS capsule Take 1 capsule by mouth daily., Historical Med        Today   VITAL SIGNS:  Blood pressure (!) 104/55, pulse 77, temperature 98.1 F (36.7 C), temperature source Oral, resp. rate 18, height 5\' 9"  (1.753 m), weight 65.8 kg (145 lb 1 oz), SpO2 94 %.  I/O:  No intake or output data in the 24 hours ending  03/09/16 1617  PHYSICAL EXAMINATION:  Physical Exam  GENERAL:  76 y.o.-year-old patient lying in the bed with no acute distress.  LUNGS: Normal breath sounds bilaterally, no wheezing, rales,rhonchi or crepitation. No use of accessory muscles of respiration.  CARDIOVASCULAR: S1, S2 normal. No murmurs, rubs, or gallops.  ABDOMEN: Soft, non-tender, non-distended. Bowel sounds present. No organomegaly or mass.  NEUROLOGIC: Moves all 4 extremities. PSYCHIATRIC: The patient is alert and oriented x 3.  SKIN: No obvious rash, lesion, or ulcer.   DATA REVIEW:   CBC  Recent Labs Lab 03/03/16 0459  WBC 3.0*  HGB 12.4*  HCT 36.3*  PLT 154    Chemistries   Recent Labs Lab 03/03/16 0459  NA 139  K 4.1  CL 110  CO2 23  GLUCOSE 105*  BUN 35*  CREATININE 0.97  CALCIUM 9.1    Cardiac Enzymes No results for input(s): TROPONINI in the last 168 hours.  Microbiology Results  Results for orders placed or performed during the hospital encounter of 02/29/16  Blood Culture (routine x 2)     Status: None   Collection Time: 02/29/16  2:39 PM  Result Value Ref Range Status   Specimen Description BLOOD LEFT FFA  Final   Special Requests   Final    BOTTLES DRAWN AEROBIC AND ANAEROBIC AER 3 ML ANA 3ML   Culture NO GROWTH 5 DAYS  Final   Report Status 03/05/2016 FINAL  Final  Blood Culture (routine x 2)     Status: None   Collection Time: 02/29/16  2:39 PM  Result Value Ref Range Status   Specimen Description BLOOD RT A  Final   Special Requests   Final    BOTTLES DRAWN AEROBIC AND ANAEROBIC AER 3CC, ANA2CC   Culture NO GROWTH 5 DAYS  Final   Report Status 03/05/2016 FINAL  Final  Rapid Influenza A&B Antigens ([place] only)     Status: None   Collection Time: 02/29/16  2:39 PM  Result Value Ref Range Status   Influenza A ([place]) NEGATIVE NEGATIVE Final   Influenza B ([facility]) NEGATIVE NEGATIVE Final  Urine culture     Status: None   Collection Time: 02/29/16  5:08 PM  Result Value Ref  Range Status   Specimen Description URINE, RANDOM  Final   Special Requests NONE  Final   Culture NO GROWTH Performed at [facility]   Final   Report Status 03/01/2016 FINAL  Final  MRSA PCR Screening     Status: None   Collection Time: 02/29/16  9:04 PM  Result Value Ref Range Status   MRSA by PCR NEGATIVE NEGATIVE Final    Comment:        The GeneXpert MRSA Assay (FDA approved for NASAL specimens only), is one component of a comprehensive MRSA colonization surveillance program. It is not intended to diagnose MRSA infection nor to guide or monitor treatment for MRSA infections.   Respiratory Panel by PCR     Status: None   Collection Time: 02/29/16  11:56 PM  Result Value Ref Range Status   Adenovirus NOT DETECTED NOT DETECTED Final   Coronavirus 229E NOT DETECTED NOT DETECTED Final   Coronavirus HKU1 NOT DETECTED NOT DETECTED Final   Coronavirus NL63 NOT DETECTED NOT DETECTED Final   Coronavirus OC43 NOT DETECTED NOT DETECTED Final   Metapneumovirus NOT DETECTED NOT DETECTED Final   Rhinovirus / Enterovirus NOT DETECTED NOT DETECTED Final   Influenza A NOT DETECTED NOT DETECTED Final   Influenza B NOT DETECTED NOT DETECTED Final   Parainfluenza Virus 1 NOT DETECTED NOT DETECTED Final   Parainfluenza Virus 2 NOT DETECTED NOT DETECTED Final   Parainfluenza Virus 3 NOT DETECTED NOT DETECTED Final   Parainfluenza Virus 4 NOT DETECTED NOT DETECTED Final   Respiratory Syncytial Virus NOT DETECTED NOT DETECTED Final   Bordetella pertussis NOT DETECTED NOT DETECTED Final   Chlamydophila pneumoniae NOT DETECTED NOT DETECTED Final   Mycoplasma pneumoniae NOT DETECTED NOT DETECTED Final    Comment: Performed at [place]:  No results found.  Follow up with PCP in 1 week.  Management plans discussed with the patient, family and they are in agreement.  CODE STATUS:  Code Status History    Date Active Date Inactive Code Status Order ID Comments  User Context   03/01/2016 10:34 AM 03/03/2016  6:09 PM Full Code [id number]  Wilhelmina Mcardle, MD Inpatient      TOTAL TIME TAKING CARE OF THIS PATIENT ON DAY OF DISCHARGE: more than 30 minutes.   Hillary Bow R M.D on 03/09/2016 at 4:17 PM  Between 7am to 6pm - Pager - [phone]  After 6pm go to [email or web address] - password [email or web address] [facility]  Office  [phone]  CC: Primary care physician; Ezequiel Kayser, MD  Note: This dictation was prepared with Dragon dictation along with smaller phrase technology. Any transcriptional errors that result from this process are unintentional.

## 2016-03-14 DIAGNOSIS — J13 Pneumonia due to Streptococcus pneumoniae: Secondary | ICD-10-CM | POA: Diagnosis not present

## 2016-03-14 DIAGNOSIS — F3130 Bipolar disorder, current episode depressed, mild or moderate severity, unspecified: Secondary | ICD-10-CM | POA: Diagnosis not present

## 2016-03-14 DIAGNOSIS — N320 Bladder-neck obstruction: Secondary | ICD-10-CM | POA: Diagnosis not present

## 2016-03-14 DIAGNOSIS — M940 Chondrocostal junction syndrome [Tietze]: Secondary | ICD-10-CM | POA: Diagnosis not present

## 2016-03-14 DIAGNOSIS — Z4802 Encounter for removal of sutures: Secondary | ICD-10-CM | POA: Diagnosis not present

## 2016-03-15 DIAGNOSIS — K219 Gastro-esophageal reflux disease without esophagitis: Secondary | ICD-10-CM | POA: Diagnosis not present

## 2016-03-15 DIAGNOSIS — Z9181 History of falling: Secondary | ICD-10-CM | POA: Diagnosis not present

## 2016-03-15 DIAGNOSIS — J13 Pneumonia due to Streptococcus pneumoniae: Secondary | ICD-10-CM | POA: Diagnosis not present

## 2016-03-15 DIAGNOSIS — D649 Anemia, unspecified: Secondary | ICD-10-CM | POA: Diagnosis not present

## 2016-03-15 DIAGNOSIS — F3132 Bipolar disorder, current episode depressed, moderate: Secondary | ICD-10-CM | POA: Diagnosis not present

## 2016-03-17 DIAGNOSIS — J13 Pneumonia due to Streptococcus pneumoniae: Secondary | ICD-10-CM | POA: Diagnosis not present

## 2016-03-17 DIAGNOSIS — D649 Anemia, unspecified: Secondary | ICD-10-CM | POA: Diagnosis not present

## 2016-03-17 DIAGNOSIS — K219 Gastro-esophageal reflux disease without esophagitis: Secondary | ICD-10-CM | POA: Diagnosis not present

## 2016-03-17 DIAGNOSIS — F3132 Bipolar disorder, current episode depressed, moderate: Secondary | ICD-10-CM | POA: Diagnosis not present

## 2016-03-17 DIAGNOSIS — Z9181 History of falling: Secondary | ICD-10-CM | POA: Diagnosis not present

## 2016-03-23 DIAGNOSIS — F3132 Bipolar disorder, current episode depressed, moderate: Secondary | ICD-10-CM | POA: Diagnosis not present

## 2016-03-23 DIAGNOSIS — D649 Anemia, unspecified: Secondary | ICD-10-CM | POA: Diagnosis not present

## 2016-03-23 DIAGNOSIS — Z9181 History of falling: Secondary | ICD-10-CM | POA: Diagnosis not present

## 2016-03-23 DIAGNOSIS — K219 Gastro-esophageal reflux disease without esophagitis: Secondary | ICD-10-CM | POA: Diagnosis not present

## 2016-03-23 DIAGNOSIS — J13 Pneumonia due to Streptococcus pneumoniae: Secondary | ICD-10-CM | POA: Diagnosis not present

## 2016-03-24 DIAGNOSIS — K219 Gastro-esophageal reflux disease without esophagitis: Secondary | ICD-10-CM | POA: Diagnosis not present

## 2016-03-24 DIAGNOSIS — J13 Pneumonia due to Streptococcus pneumoniae: Secondary | ICD-10-CM | POA: Diagnosis not present

## 2016-03-24 DIAGNOSIS — F3132 Bipolar disorder, current episode depressed, moderate: Secondary | ICD-10-CM | POA: Diagnosis not present

## 2016-03-24 DIAGNOSIS — D649 Anemia, unspecified: Secondary | ICD-10-CM | POA: Diagnosis not present

## 2016-03-24 DIAGNOSIS — Z9181 History of falling: Secondary | ICD-10-CM | POA: Diagnosis not present

## 2016-03-29 DIAGNOSIS — J181 Lobar pneumonia, unspecified organism: Secondary | ICD-10-CM | POA: Diagnosis not present

## 2016-03-29 DIAGNOSIS — J13 Pneumonia due to Streptococcus pneumoniae: Secondary | ICD-10-CM | POA: Diagnosis not present

## 2016-04-07 DIAGNOSIS — Z23 Encounter for immunization: Secondary | ICD-10-CM | POA: Diagnosis not present

## 2016-04-07 DIAGNOSIS — E7800 Pure hypercholesterolemia, unspecified: Secondary | ICD-10-CM | POA: Diagnosis not present

## 2016-04-07 DIAGNOSIS — N400 Enlarged prostate without lower urinary tract symptoms: Secondary | ICD-10-CM | POA: Diagnosis not present

## 2016-04-07 DIAGNOSIS — Z0000 Encounter for general adult medical examination without abnormal findings: Secondary | ICD-10-CM | POA: Diagnosis not present

## 2016-04-07 DIAGNOSIS — Z8701 Personal history of pneumonia (recurrent): Secondary | ICD-10-CM | POA: Diagnosis not present

## 2016-04-07 DIAGNOSIS — E559 Vitamin D deficiency, unspecified: Secondary | ICD-10-CM | POA: Diagnosis not present

## 2016-04-07 DIAGNOSIS — D509 Iron deficiency anemia, unspecified: Secondary | ICD-10-CM | POA: Diagnosis not present

## 2016-04-07 DIAGNOSIS — D72819 Decreased white blood cell count, unspecified: Secondary | ICD-10-CM | POA: Diagnosis not present

## 2016-04-07 DIAGNOSIS — F3176 Bipolar disorder, in full remission, most recent episode depressed: Secondary | ICD-10-CM | POA: Diagnosis not present

## 2016-04-15 DIAGNOSIS — Z8701 Personal history of pneumonia (recurrent): Secondary | ICD-10-CM | POA: Diagnosis not present

## 2016-04-15 DIAGNOSIS — J189 Pneumonia, unspecified organism: Secondary | ICD-10-CM | POA: Diagnosis not present

## 2016-06-07 ENCOUNTER — Inpatient Hospital Stay: Payer: Medicare HMO | Attending: Internal Medicine

## 2016-06-07 ENCOUNTER — Inpatient Hospital Stay (HOSPITAL_BASED_OUTPATIENT_CLINIC_OR_DEPARTMENT_OTHER): Payer: Medicare HMO | Admitting: Internal Medicine

## 2016-06-07 DIAGNOSIS — M1990 Unspecified osteoarthritis, unspecified site: Secondary | ICD-10-CM | POA: Insufficient documentation

## 2016-06-07 DIAGNOSIS — R634 Abnormal weight loss: Secondary | ICD-10-CM | POA: Diagnosis not present

## 2016-06-07 DIAGNOSIS — Z79899 Other long term (current) drug therapy: Secondary | ICD-10-CM | POA: Insufficient documentation

## 2016-06-07 DIAGNOSIS — D72819 Decreased white blood cell count, unspecified: Secondary | ICD-10-CM

## 2016-06-07 DIAGNOSIS — K219 Gastro-esophageal reflux disease without esophagitis: Secondary | ICD-10-CM | POA: Insufficient documentation

## 2016-06-07 DIAGNOSIS — F319 Bipolar disorder, unspecified: Secondary | ICD-10-CM | POA: Insufficient documentation

## 2016-06-07 DIAGNOSIS — D509 Iron deficiency anemia, unspecified: Secondary | ICD-10-CM

## 2016-06-07 DIAGNOSIS — Z87891 Personal history of nicotine dependence: Secondary | ICD-10-CM | POA: Insufficient documentation

## 2016-06-07 DIAGNOSIS — D125 Benign neoplasm of sigmoid colon: Secondary | ICD-10-CM | POA: Diagnosis not present

## 2016-06-07 DIAGNOSIS — Z96611 Presence of right artificial shoulder joint: Secondary | ICD-10-CM | POA: Diagnosis not present

## 2016-06-07 DIAGNOSIS — D72810 Lymphocytopenia: Secondary | ICD-10-CM | POA: Insufficient documentation

## 2016-06-07 LAB — CBC WITH DIFFERENTIAL/PLATELET
BASOS ABS: 0 10*3/uL (ref 0–0.1)
BASOS PCT: 1 %
EOS ABS: 0.1 10*3/uL (ref 0–0.7)
Eosinophils Relative: 4 %
HEMATOCRIT: 44.8 % (ref 40.0–52.0)
Hemoglobin: 15 g/dL (ref 13.0–18.0)
Lymphocytes Relative: 41 %
Lymphs Abs: 1.3 10*3/uL (ref 1.0–3.6)
MCH: 32.2 pg (ref 26.0–34.0)
MCHC: 33.5 g/dL (ref 32.0–36.0)
MCV: 96.1 fL (ref 80.0–100.0)
MONO ABS: 0.3 10*3/uL (ref 0.2–1.0)
Monocytes Relative: 10 %
NEUTROS ABS: 1.4 10*3/uL (ref 1.4–6.5)
Neutrophils Relative %: 44 %
Platelets: 181 10*3/uL (ref 150–440)
RBC: 4.67 MIL/uL (ref 4.40–5.90)
RDW: 14.9 % — ABNORMAL HIGH (ref 11.5–14.5)
WBC: 3.1 10*3/uL — ABNORMAL LOW (ref 3.8–10.6)

## 2016-06-07 MED ORDER — PREDNISONE 20 MG PO TABS: 20.0000 mg | ORAL_TABLET | Freq: Every day | ORAL | 0 refills | Status: DC

## 2016-06-07 NOTE — Progress Notes (Signed)
[facility] OFFICE PROGRESS NOTE  Patient Care Team: Ezequiel Kayser, MD as PCP - General (Internal Medicine)   SUMMARY OF ONCOLOGIC HISTORY:  # 2014- CHRONIC  LEUCOPENIA/Lymphopenia; [ NOV 2014- [place] normal spleen/liver; SIEP/HbsAg;HCV/HIV-neg; peripheral blood flow- unremarkable]   # Oct 2014- [place] [Aug 2014- polyp]- on PO iron  #Hx of depression/Bipolar- [[facility] psyche]   INTERVAL HISTORY:  76 year old male patient with above history of chronic leukopenia/lymphopenia is here for follow-up.   Patient stated that he was admitted to the hospital in January/February 2018 for pneumonia. Patient is currently improved- however as per the wife patient continues to struggle with depression/bipolar  Patient had been started on ritalin by his psychiatrist. Patient tolerated this poorly. As currently off her psychiatric medication.  Patient continues to complain of poor appetite. Poor taste. He continues to lose weight. Complains of fatigue. No chest pain shortness of breath or cough. Denies any night sweats.   REVIEW OF SYSTEMS:  A complete 10 point review of system is done which is negative except mentioned above/history of present illness.   PAST MEDICAL HISTORY :  Past Medical History:  Diagnosis Date  . Allergic rhinitis   . Anemia   . Bipolar disorder ([place])   . Elevated PSA    had biopsy at [facility] around 2009-2010 negative for malignancy  . Erectile dysfunction   . GERD (gastroesophageal reflux disease)   . Leucopenia   . Osteoarthritis    in hands  . Prostatitis   . Sigmoid polyp     PAST SURGICAL HISTORY :   Past Surgical History:  Procedure Laterality Date  . COLONOSCOPY  10/20/12  . HERNIA REPAIR    . Left knee replacement    . Right shoulder replacement      FAMILY HISTORY :   Family History  Problem Relation Age of Onset  . CVA Mother   . CAD Father     SOCIAL HISTORY:   Social History  Substance Use Topics  . Smoking status: Former Smoker   Types: Cigarettes    Quit date: 02/22/1975  . Smokeless tobacco: Never Used  . Alcohol use No    ALLERGIES:  is allergic to quetiapine.  MEDICATIONS:  Current Outpatient Prescriptions  Medication Sig Dispense Refill  . acetaminophen (TYLENOL) 500 MG tablet Take 500 mg by mouth every 8 (eight) hours as needed for moderate pain.     Marland Kitchen docusate sodium (COLACE) 100 MG capsule Take 1 capsule (100 mg total) by mouth daily. 15 capsule 0  . Multiple Vitamin (MULTIVITAMIN) tablet Take 1 tablet by mouth daily.    . multivitamin-lutein (OCUVITE-LUTEIN) CAPS capsule Take 1 capsule by mouth daily.    . predniSONE (DELTASONE) 20 MG tablet Take 1 tablet (20 mg total) by mouth daily with breakfast. Once a day with food. 20 tablet 0   No current facility-administered medications for this visit.     PHYSICAL EXAMINATION:   BP 134/71 (BP Location: Left Arm, Patient Position: Sitting)   Pulse 61   Temp 98.2 F (36.8 C) (Tympanic)   Resp 18   Wt 143 lb 1.3 oz (64.9 kg)   BMI 21.13 kg/m   Filed Weights   06/07/16 0948  Weight: 143 lb 1.3 oz (64.9 kg)    GENERAL: Well-nourished well-developed; Alert, no distress and comfortable.   Alone.  EYES: no pallor or icterus OROPHARYNX: no thrush or ulceration; good dentition  NECK: supple, no masses felt LYMPH:  no palpable lymphadenopathy in the cervical,  axillary or inguinal regions LUNGS: clear to auscultation and  No wheeze or crackles HEART/CVS: regular rate & rhythm and no murmurs; No lower extremity edema ABDOMEN:abdomen soft, non-tender and normal bowel sounds Musculoskeletal:no cyanosis of digits and no clubbing  PSYCH: alert & oriented x 3 with fluent speech NEURO: no focal motor/sensory deficits SKIN:  no rashes or significant lesions  LABORATORY DATA:  I have reviewed the data as listed    Component Value Date/Time   NA 139 03/03/2016 0459   NA 144 12/20/2012 1040   K 4.1 03/03/2016 0459   K 3.9 12/20/2012 1040   CL 110 03/03/2016  0459   CL 106 12/20/2012 1040   CO2 23 03/03/2016 0459   CO2 30 12/20/2012 1040   GLUCOSE 105 (H) 03/03/2016 0459   GLUCOSE 103 (H) 12/20/2012 1040   BUN 35 (H) 03/03/2016 0459   BUN 23 (H) 12/20/2012 1040   CREATININE 0.97 03/03/2016 0459   CREATININE 0.96 12/20/2012 1040   CALCIUM 9.1 03/03/2016 0459   CALCIUM 9.0 12/20/2012 1040   PROT 6.6 02/29/2016 1439   ALBUMIN 3.6 02/29/2016 1439   AST 63 (H) 02/29/2016 1439   ALT 28 02/29/2016 1439   ALKPHOS 52 02/29/2016 1439   BILITOT 1.2 02/29/2016 1439   GFRNONAA >60 03/03/2016 0459   GFRNONAA >60 12/20/2012 1040   GFRAA >60 03/03/2016 0459   GFRAA >60 12/20/2012 1040    No results found for: SPEP, UPEP  Lab Results  Component Value Date   WBC 3.1 (L) 06/07/2016   NEUTROABS 1.4 06/07/2016   HGB 15.0 06/07/2016   HCT 44.8 06/07/2016   MCV 96.1 06/07/2016   PLT 181 06/07/2016      Chemistry      Component Value Date/Time   NA 139 03/03/2016 0459   NA 144 12/20/2012 1040   K 4.1 03/03/2016 0459   K 3.9 12/20/2012 1040   CL 110 03/03/2016 0459   CL 106 12/20/2012 1040   CO2 23 03/03/2016 0459   CO2 30 12/20/2012 1040   BUN 35 (H) 03/03/2016 0459   BUN 23 (H) 12/20/2012 1040   CREATININE 0.97 03/03/2016 0459   CREATININE 0.96 12/20/2012 1040      Component Value Date/Time   CALCIUM 9.1 03/03/2016 0459   CALCIUM 9.0 12/20/2012 1040   ALKPHOS 52 02/29/2016 1439   AST 63 (H) 02/29/2016 1439   ALT 28 02/29/2016 1439   BILITOT 1.2 02/29/2016 1439       ASSESSMENT & PLAN:   Lymphopenia # Mild leukopenia/lymphopenia- today  white count is 3.0- differential is normal. ANC 1.4. Hemoglobin 15. Platelets 188. Currently asymptomatic. Hold off any bone marrow biopsy at this time.  # Weight loss/loss of appetite- ? Depression [Ritalin- [facility] pyschiatric] vs- any malignancy causing weight loss. Recommend CT of the chest and pelvis with contrast for further evaluation. Recommend prednisone 20 mg once a day- to stimulate  appetite. Discussed the potential side effects.  #follow up few days after the imaging/mebane. No labs. Above plan of care discussed with the patient and wife in detail.   Cammie Sickle, MD 06/07/2016 11:18 AM

## 2016-06-07 NOTE — Progress Notes (Signed)
Here for follow up. Diff w appetite. ritaling not effective to stimulate appetite. Asking if appetite stimulants other than Ritalin

## 2016-06-07 NOTE — Assessment & Plan Note (Addendum)
#  Mild leukopenia/lymphopenia- today  white count is 3.0- differential is normal. ANC 1.4. Hemoglobin 15. Platelets 188. Currently asymptomatic. Hold off any bone marrow biopsy at this time.  # Weight loss/loss of appetite- ? Depression [Ritalin- [facility] pyschiatric] vs- any malignancy causing weight loss. Recommend CT of the chest and pelvis with contrast for further evaluation. Recommend prednisone 20 mg once a day- to stimulate appetite. Discussed the potential side effects.  #follow up few days after the imaging/mebane. No labs. Above plan of care discussed with the patient and wife in detail.

## 2016-06-08 ENCOUNTER — Other Ambulatory Visit: Payer: Self-pay | Admitting: *Deleted

## 2016-06-08 DIAGNOSIS — D509 Iron deficiency anemia, unspecified: Secondary | ICD-10-CM

## 2016-06-14 ENCOUNTER — Ambulatory Visit
Admission: RE | Admit: 2016-06-14 | Discharge: 2016-06-14 | Disposition: A | Discharge status: Home / Self Care | Payer: Medicare HMO | Source: Ambulatory Visit | Attending: Internal Medicine | Admitting: Internal Medicine

## 2016-06-14 ENCOUNTER — Other Ambulatory Visit
Admission: RE | Admit: 2016-06-14 | Discharge: 2016-06-14 | Disposition: A | Discharge status: Home / Self Care | Payer: Medicare HMO | Source: Ambulatory Visit | Attending: Internal Medicine | Admitting: Internal Medicine

## 2016-06-14 DIAGNOSIS — I700 Atherosclerosis of aorta: Secondary | ICD-10-CM | POA: Insufficient documentation

## 2016-06-14 DIAGNOSIS — N400 Enlarged prostate without lower urinary tract symptoms: Secondary | ICD-10-CM | POA: Insufficient documentation

## 2016-06-14 DIAGNOSIS — D72810 Lymphocytopenia: Secondary | ICD-10-CM | POA: Insufficient documentation

## 2016-06-14 DIAGNOSIS — N200 Calculus of kidney: Secondary | ICD-10-CM | POA: Insufficient documentation

## 2016-06-14 DIAGNOSIS — D509 Iron deficiency anemia, unspecified: Secondary | ICD-10-CM

## 2016-06-14 DIAGNOSIS — R591 Generalized enlarged lymph nodes: Secondary | ICD-10-CM | POA: Diagnosis not present

## 2016-06-14 DIAGNOSIS — R634 Abnormal weight loss: Secondary | ICD-10-CM

## 2016-06-14 LAB — CREATININE, SERUM
CREATININE: 1.26 mg/dL — AB (ref 0.61–1.24)
GFR calc Af Amer: >60 mL/min (ref >60)
GFR, EST NON AFRICAN AMERICAN: 53 mL/min — AB (ref >60)

## 2016-06-14 MED ORDER — IOPAMIDOL (ISOVUE-300) INJECTION 61%
100.0000 mL | Freq: Once | INTRAVENOUS | Status: AC | PRN
  Administered 2016-06-14: 100 mL via INTRAVENOUS

## 2016-06-16 DIAGNOSIS — K5909 Other constipation: Secondary | ICD-10-CM | POA: Diagnosis not present

## 2016-06-16 DIAGNOSIS — R5383 Other fatigue: Secondary | ICD-10-CM | POA: Diagnosis not present

## 2016-06-16 DIAGNOSIS — N183 Chronic kidney disease, stage 3 (moderate): Secondary | ICD-10-CM | POA: Diagnosis not present

## 2016-06-16 DIAGNOSIS — R630 Anorexia: Secondary | ICD-10-CM | POA: Diagnosis not present

## 2016-06-16 DIAGNOSIS — E559 Vitamin D deficiency, unspecified: Secondary | ICD-10-CM | POA: Diagnosis not present

## 2016-06-16 DIAGNOSIS — F314 Bipolar disorder, current episode depressed, severe, without psychotic features: Secondary | ICD-10-CM | POA: Diagnosis not present

## 2016-06-16 DIAGNOSIS — R634 Abnormal weight loss: Secondary | ICD-10-CM | POA: Diagnosis not present

## 2016-06-21 ENCOUNTER — Inpatient Hospital Stay: Payer: Medicare HMO | Admitting: Internal Medicine

## 2016-07-19 ENCOUNTER — Encounter: Payer: Self-pay | Admitting: *Deleted

## 2016-08-23 DIAGNOSIS — Z79899 Other long term (current) drug therapy: Secondary | ICD-10-CM | POA: Diagnosis not present

## 2016-08-23 DIAGNOSIS — Z96611 Presence of right artificial shoulder joint: Secondary | ICD-10-CM | POA: Diagnosis not present

## 2016-08-23 DIAGNOSIS — R531 Weakness: Secondary | ICD-10-CM | POA: Diagnosis not present

## 2016-08-23 DIAGNOSIS — F329 Major depressive disorder, single episode, unspecified: Secondary | ICD-10-CM | POA: Diagnosis not present

## 2016-08-23 DIAGNOSIS — R630 Anorexia: Secondary | ICD-10-CM | POA: Diagnosis not present

## 2016-08-23 DIAGNOSIS — Z87891 Personal history of nicotine dependence: Secondary | ICD-10-CM | POA: Diagnosis not present

## 2016-09-06 DIAGNOSIS — G4710 Hypersomnia, unspecified: Secondary | ICD-10-CM | POA: Diagnosis not present

## 2016-09-06 DIAGNOSIS — R630 Anorexia: Secondary | ICD-10-CM | POA: Diagnosis not present

## 2016-09-06 DIAGNOSIS — F3130 Bipolar disorder, current episode depressed, mild or moderate severity, unspecified: Secondary | ICD-10-CM | POA: Diagnosis not present

## 2016-09-22 ENCOUNTER — Encounter: Payer: Self-pay | Admitting: Emergency Medicine

## 2016-09-22 ENCOUNTER — Emergency Department: Payer: Medicare HMO

## 2016-09-22 ENCOUNTER — Emergency Department
Admission: EM | Admit: 2016-09-22 | Discharge: 2016-09-22 | Disposition: A | Discharge status: Home / Self Care | Payer: Medicare HMO | Attending: Emergency Medicine | Admitting: Emergency Medicine

## 2016-09-22 DIAGNOSIS — K5900 Constipation, unspecified: Secondary | ICD-10-CM

## 2016-09-22 DIAGNOSIS — R339 Retention of urine, unspecified: Secondary | ICD-10-CM | POA: Insufficient documentation

## 2016-09-22 DIAGNOSIS — Z87891 Personal history of nicotine dependence: Secondary | ICD-10-CM | POA: Insufficient documentation

## 2016-09-22 DIAGNOSIS — R1030 Lower abdominal pain, unspecified: Secondary | ICD-10-CM | POA: Diagnosis present

## 2016-09-22 DIAGNOSIS — Z79899 Other long term (current) drug therapy: Secondary | ICD-10-CM | POA: Insufficient documentation

## 2016-09-22 DIAGNOSIS — N200 Calculus of kidney: Secondary | ICD-10-CM | POA: Diagnosis not present

## 2016-09-22 LAB — CBC WITH DIFFERENTIAL/PLATELET
Basophils Absolute: 0 10*3/uL (ref 0–0.1)
Basophils Relative: 0 %
EOS PCT: 1 %
Eosinophils Absolute: 0 10*3/uL (ref 0–0.7)
HCT: 41.8 % (ref 40.0–52.0)
Hemoglobin: 14.3 g/dL (ref 13.0–18.0)
LYMPHS ABS: 0.4 10*3/uL — AB (ref 1.0–3.6)
LYMPHS PCT: 9 %
MCH: 32.5 pg (ref 26.0–34.0)
MCHC: 34.1 g/dL (ref 32.0–36.0)
MCV: 95.3 fL (ref 80.0–100.0)
MONOS PCT: 7 %
Monocytes Absolute: 0.3 10*3/uL (ref 0.2–1.0)
Neutro Abs: 3.5 10*3/uL (ref 1.4–6.5)
Neutrophils Relative %: 83 %
PLATELETS: 178 10*3/uL (ref 150–440)
RBC: 4.39 MIL/uL — ABNORMAL LOW (ref 4.40–5.90)
RDW: 14.2 % (ref 11.5–14.5)
WBC: 4.2 10*3/uL (ref 3.8–10.6)

## 2016-09-22 LAB — URINALYSIS, COMPLETE (UACMP) WITH MICROSCOPIC
BACTERIA UA: NONE SEEN
Bilirubin Urine: NEGATIVE
Glucose, UA: NEGATIVE mg/dL
KETONES UR: NEGATIVE mg/dL
Leukocytes, UA: NEGATIVE
NITRITE: NEGATIVE
Protein, ur: NEGATIVE mg/dL
Specific Gravity, Urine: 1.016 (ref 1.005–1.030)
pH: 6 (ref 5.0–8.0)

## 2016-09-22 LAB — COMPREHENSIVE METABOLIC PANEL
ALK PHOS: 81 U/L (ref 38–126)
ALT: 14 U/L — AB (ref 17–63)
ANION GAP: 7 (ref 5–15)
AST: 27 U/L (ref 15–41)
Albumin: 4.3 g/dL (ref 3.5–5.0)
BUN: 25 mg/dL — AB (ref 6–20)
CALCIUM: 9.8 mg/dL (ref 8.9–10.3)
CO2: 25 mmol/L (ref 22–32)
Chloride: 107 mmol/L (ref 101–111)
Creatinine, Ser: 1.17 mg/dL (ref 0.61–1.24)
GFR calc Af Amer: >60 mL/min (ref >60)
GFR, EST NON AFRICAN AMERICAN: 58 mL/min — AB (ref >60)
Glucose, Bld: 98 mg/dL (ref 65–99)
POTASSIUM: 3.8 mmol/L (ref 3.5–5.1)
Sodium: 139 mmol/L (ref 135–145)
TOTAL PROTEIN: 6.8 g/dL (ref 6.5–8.1)
Total Bilirubin: 1.1 mg/dL (ref 0.3–1.2)

## 2016-09-22 LAB — LIPASE, BLOOD: Lipase: 23 U/L (ref 11–51)

## 2016-09-22 MED ORDER — TAMSULOSIN HCL 0.4 MG PO CAPS
0.4000 mg | ORAL_CAPSULE | Freq: Once | ORAL | Status: AC
  Administered 2016-09-22: 0.4 mg via ORAL
  Filled 2016-09-22: qty 1

## 2016-09-22 MED ORDER — MINERAL OIL RE ENEM
1.0000 | ENEMA | Freq: Once | RECTAL | Status: AC
  Administered 2016-09-22: 1 via RECTAL

## 2016-09-22 MED ORDER — TAMSULOSIN HCL 0.4 MG PO CAPS: 0.4000 mg | ORAL_CAPSULE | Freq: Every day | ORAL | 0 refills | Status: AC

## 2016-09-22 NOTE — ED Notes (Signed)
NAD noted at time of D/C. Pt taken to lobby via wheelchair by this RN. Pt and wife denies any comments/concerns at this time.

## 2016-09-22 NOTE — ED Triage Notes (Signed)
First Nurse Note:  Arrives c/o lower abdominal pain.  States he may be constipated, last BM 2-3 days ago.  Took Miralax last night without result.  Also has been unable voided since last night before going to bed.

## 2016-09-22 NOTE — ED Notes (Signed)
Pt taken off the bedpan by this RN. Pt repositioned in bed for comfort. NAD noted at this time. Pt's wife remains at bedside. Will continue to monitor for further patient needs.

## 2016-09-22 NOTE — ED Notes (Signed)
Patient has >977ml of fluid in bladder per bladder scanner.  Dr. Alfred Levins is aware.

## 2016-09-22 NOTE — ED Provider Notes (Signed)
[facility] Emergency Department Provider Note  ____________________________________________  Time seen: Approximately 9:05 AM  I have reviewed the triage vital signs and the nursing notes.   HISTORY  Chief Complaint Abdominal Pain   HPI David [name] is a 76 y.o. male with no significant past medical history who presents for evaluation of urinary retention, constipation, and abdominal pain. Patient reports that he has been struggling with constipation for a week after being started on a new medication for depression. He took MiraLAX yesterday evening with no help. He reports 12 hours of urinary retention. He is complaining of severe suprapubic abdominal pain associated with distention, 10 out of 10, sharp. No nausea or vomiting, no fever or chills. His only prior abdominal surgery was an umbilical hernia. No prior history of SBO.No known history of BPH. No dysuria or hematuria.  Past Medical History:  Diagnosis Date  . Allergic rhinitis   . Anemia   . Bipolar disorder ([place])   . Elevated PSA    had biopsy at [facility] around 2009-2010 negative for malignancy  . Erectile dysfunction   . GERD (gastroesophageal reflux disease)   . Leucopenia   . Osteoarthritis    in hands  . Prostatitis   . Sigmoid polyp     Patient Active Problem List   Diagnosis Date Noted  . Lymphopenia 06/07/2016  . Abnormal weight loss 06/07/2016  . Community acquired pneumonia of left lower lobe of lung ([place])   . Pneumonia of left lower lobe due to Streptococcus pneumoniae ([place])   . Acute respiratory failure ([place])   . Bipolar disorder with moderate depression ([place]) 03/01/2016  . Hypotension 02/29/2016  . IDA (iron deficiency anemia) 06/19/2014  . Leucopenia 06/19/2014    Past Surgical History:  Procedure Laterality Date  . COLONOSCOPY  10/20/12  . HERNIA REPAIR    . Left knee replacement    . Right shoulder replacement      Prior to Admission medications   Medication Sig  Start Date End Date Taking? Authorizing Provider  acetaminophen (TYLENOL) 500 MG tablet Take 500 mg by mouth every 8 (eight) hours as needed for moderate pain.    Yes [provider]  Multiple Vitamin (MULTIVITAMIN) tablet Take 1 tablet by mouth daily.   Yes [provider]  multivitamin-lutein (OCUVITE-LUTEIN) CAPS capsule Take 1 capsule by mouth daily.   Yes [provider]  nortriptyline (PAMELOR) 25 MG capsule Take 50 mg by mouth at bedtime. 09/09/16  Yes [provider]  docusate sodium (COLACE) 100 MG capsule Take 1 capsule (100 mg total) by mouth daily. Patient not taking: Reported on 09/22/2016 03/03/16   Hillary Bow, MD  predniSONE (DELTASONE) 20 MG tablet Take 1 tablet (20 mg total) by mouth daily with breakfast. Once a day with food. Patient not taking: Reported on 09/22/2016 06/07/16   Cammie Sickle, MD  tamsulosin (FLOMAX) 0.4 MG CAPS capsule Take 1 capsule (0.4 mg total) by mouth daily. 09/22/16 09/29/16  Rudene Re, MD    Allergies Quetiapine  Family History  Problem Relation Age of Onset  . CVA Mother   . CAD Father     Social History Social History  Substance Use Topics  . Smoking status: Former Smoker    Types: Cigarettes    Quit date: 02/22/1975  . Smokeless tobacco: Never Used  . Alcohol use No    Review of Systems  Constitutional: Negative for fever. Eyes: Negative for visual changes. ENT: Negative for sore throat.  Neck: No neck pain  Cardiovascular: Negative for chest pain. Respiratory: Negative for shortness of breath. Gastrointestinal: +abdominal pain and constipation. No vomiting or diarrhea. Genitourinary: Negative for dysuria. + Urinary retention Musculoskeletal: Negative for back pain. Skin: Negative for rash. Neurological: Negative for headaches, weakness or numbness. Psych: No SI or HI  ____________________________________________   PHYSICAL EXAM:  VITAL SIGNS: ED Triage Vitals  Enc Vitals  Group     BP 09/22/16 0756 117/74     Pulse Rate 09/22/16 0756 85     Resp 09/22/16 0756 (!) 22     Temp 09/22/16 0756 98.1 F (36.7 C)     Temp Source 09/22/16 0756 Oral     SpO2 09/22/16 0756 99 %     Weight 09/22/16 0751 141 lb (64 kg)     Height 09/22/16 0751 5\' 9"  (1.753 m)     Head Circumference --      Peak Flow --      Pain Score 09/22/16 0751 9     Pain Loc --      Pain Edu? --      Excl. in [place]? --     Constitutional: Alert and oriented, in significant distress due to pain.  HEENT:      Head: Normocephalic and atraumatic.         Eyes: Conjunctivae are normal. Sclera is non-icteric.       Mouth/Throat: Mucous membranes are moist.       Neck: Supple with no signs of meningismus. Cardiovascular: Regular rate and rhythm. No murmurs, gallops, or rubs. 2+ symmetrical distal pulses are present in all extremities. No JVD. Respiratory: Normal respiratory effort. Lungs are clear to auscultation bilaterally. No wheezes, crackles, or rhonchi.  Gastrointestinal: Severely distended bladder on palpation of abdomen which is the source of patient's tenderness, positive bowel sounds. No rebound or guarding. Musculoskeletal: Nontender with normal range of motion in all extremities. No edema, cyanosis, or erythema of extremities. Neurologic: Normal speech and language. Face is symmetric. Moving all extremities. No gross focal neurologic deficits are appreciated. Skin: Skin is warm, dry and intact. No rash noted. Psychiatric: Mood and affect are normal. Speech and behavior are normal.  ____________________________________________   LABS (all labs ordered are listed, but only abnormal results are displayed)  Labs Reviewed  CBC WITH DIFFERENTIAL/PLATELET - Abnormal; Notable for the following:       Result Value   RBC 4.39 (*)    Lymphs Abs 0.4 (*)    All other components within normal limits  COMPREHENSIVE METABOLIC PANEL - Abnormal; Notable for the following:    BUN 25 (*)    ALT 14  (*)    GFR calc non Af Amer 58 (*)    All other components within normal limits  URINALYSIS, COMPLETE (UACMP) WITH MICROSCOPIC - Abnormal; Notable for the following:    Color, Urine YELLOW (*)    APPearance CLEAR (*)    Hgb urine dipstick SMALL (*)    Squamous Epithelial / LPF 0-5 (*)    All other components within normal limits  URINE CULTURE  LIPASE, BLOOD   ____________________________________________  EKG  none  ____________________________________________  RADIOLOGY  KUB: Bilateral nephrolithiasis. Large stool burden is noted consistent with constipation.  ____________________________________________   PROCEDURES  Procedure(s) performed: none Procedures Critical Care performed:  None ____________________________________________   INITIAL IMPRESSION / ASSESSMENT AND PLAN / ED COURSE  76 y.o. male with no significant past medical history who presents for evaluation of urinary retention, constipation,  and abdominal pain. Urinary retention in the setting of one week of constipation. KUB concerning for large stool burden. Disimpaction was unsuccessful since stool was too deep. Bladder scan showing greater than a liter. Patient received a Foley catheter with full resolution of his pain. We'll give an enema, check basic blood work and reassess    _________________________ 12:31 PM on 09/22/2016 -----------------------------------------  Patient received an enema and has had several large bowel movements. Patient be discharged home with the Foley catheter, Flomax, referral to urology. Recommend return to the emergency room if he develops dysuria, fever, abdominal pain, nausea or vomiting. Patient's abdomen with no tenderness during his stay in the emergency room. Labs are within normal limits. Patient will be discharged on Colace, senna, MiraLAX.  Pertinent labs & imaging results that were available during my care of the patient were reviewed by me and considered in my  medical decision making (see chart for details).    ____________________________________________   FINAL CLINICAL IMPRESSION(S) / ED DIAGNOSES  Final diagnoses:  Constipation, unspecified constipation type  Urinary retention      NEW MEDICATIONS STARTED DURING THIS VISIT:  New Prescriptions   TAMSULOSIN (FLOMAX) 0.4 MG CAPS CAPSULE    Take 1 capsule (0.4 mg total) by mouth daily.     Note:  This document was prepared using Dragon voice recognition software and may include unintentional dictation errors.    Alfred Levins, [place], MD 09/22/16 [phone]

## 2016-09-22 NOTE — ED Notes (Signed)
Foley changed to leg bag by this RN. Teaching regarding foley care and leg bag care provided by this RN.

## 2016-09-22 NOTE — Discharge Instructions (Signed)
Constipation: Take colace twice a day everyday. Take senna once a day at bedtime. Take daily probiotics. Drink plenty of fluids and eat a diet rich in fiber. If you go more than 3 days without a bowel movement, take 1 cap full of Miralax in the morning and one in the evening up to 5 days.    Catheter care  Always wash your hands before and after touching your catheter.  Check the area around the urethra for inflammation or signs of infection. Signs of infection include irritated, swollen, red, or tender skin, or pus around the catheter.  Clean the area around the catheter with soap and water two times a day. Dry with a clean towel afterward.  Do not apply powder or lotion to the skin around the catheter.  To empty the urine collection bag  Wash your hands with soap and water.  Without touching the drain spout, remove the spout from its sleeve at the bottom of the collection bag. Open the valve on the spout.  Let the urine flow out of the bag and into the toilet or a container. Do not let the tubing or drain spout touch anything.  After you empty the bag, clean the end of the drain spout with tissue and water. Close the valve and put the drain spout back into its sleeve at the bottom of the collection bag.  Wash your hands with soap and water.

## 2016-09-22 NOTE — ED Notes (Signed)
This RN to bedside to introduce self to patient and wife. Pt remains on the bedpan, requests to remain on the bedpan to "keep trying". Pt is alert and oriented, visualized in NAD, respirations even and unlabored, skin warm, dry, and intact. Pt denies any needs at this time. Will continue to monitor for further patient needs.

## 2016-09-23 LAB — URINE CULTURE: CULTURE: NO GROWTH

## 2016-10-03 ENCOUNTER — Encounter: Payer: Self-pay | Admitting: Urology

## 2016-10-03 ENCOUNTER — Ambulatory Visit: Payer: Medicare HMO | Admitting: Urology

## 2016-10-03 VITALS — BP 97/61 | HR 92 | Ht 69.0 in | Wt 142.7 lb

## 2016-10-03 DIAGNOSIS — R339 Retention of urine, unspecified: Secondary | ICD-10-CM | POA: Diagnosis not present

## 2016-10-03 LAB — BLADDER SCAN AMB NON-IMAGING: SCAN RESULT: 18

## 2016-10-03 NOTE — Progress Notes (Signed)
10/03/2016 8:34 AM   David [name] Dec 18, 1940 [id number]  Referring provider: Ezequiel Kayser, MD [facility] [place], [place] [address]  Chief Complaint  Patient presents with  . Urinary Retention    HPI: The patient 12 days ago reported acute onset of urinary retention and significant constipation. Bowel movements have normalized. He was given 1 week of tamsulosin but now is not on the medication and was not on a prior. Normally he reports a good flow and feels empty and has no nocturia.  He does not have a history kidney stones or previous GU surgery. He does not get bladder infections. He has no neurologic issues  Modifying factors: There are no other modifying factors  Associated signs and symptoms: There are no other associated signs and symptoms Aggravating and relieving factors: There are no other aggravating or relieving factors Severity: Moderate Duration: Persistent     PMH: Past Medical History:  Diagnosis Date  . Allergic rhinitis   . Anemia   . Bipolar disorder ([place])   . Elevated PSA    had biopsy at [facility] around 2009-2010 negative for malignancy  . Erectile dysfunction   . GERD (gastroesophageal reflux disease)   . Leucopenia   . Osteoarthritis    in hands  . Prostatitis   . Sigmoid polyp     Surgical History: Past Surgical History:  Procedure Laterality Date  . COLONOSCOPY  10/20/12  . HERNIA REPAIR    . Left knee replacement    . Right shoulder replacement      Home Medications:  Allergies as of 10/03/2016      Reactions   Quetiapine Anxiety   Restlessness, worsening anxiety  Restlessness, worsening anxiety       Medication List       Accurate as of 10/03/16  8:34 AM. Always use your most recent med list.          acetaminophen 500 MG tablet Commonly known as:  TYLENOL Take 500 mg by mouth every 8 (eight) hours as needed for moderate pain.   docusate sodium 100 MG capsule Commonly known as:  COLACE Take 1  capsule (100 mg total) by mouth daily.   multivitamin tablet Take 1 tablet by mouth daily.   multivitamin-lutein Caps capsule Take 1 capsule by mouth daily.   nortriptyline 25 MG capsule Commonly known as:  PAMELOR Take 50 mg by mouth at bedtime.   predniSONE 20 MG tablet Commonly known as:  DELTASONE Take 1 tablet (20 mg total) by mouth daily with breakfast. Once a day with food.       Allergies:  Allergies  Allergen Reactions  . Quetiapine Anxiety    Restlessness, worsening anxiety  Restlessness, worsening anxiety     Family History: Family History  Problem Relation Age of Onset  . CVA Mother   . CAD Father   . Prostate cancer Neg Hx   . Kidney cancer Neg Hx   . Bladder Cancer Neg Hx     Social History:  reports that he quit smoking about 41 years ago. His smoking use included Cigarettes. He has never used smokeless tobacco. He reports that he does not drink alcohol. His drug history is not on file.  ROS: UROLOGY Frequent Urination?: No Hard to postpone urination?: No Burning/pain with urination?: No Get up at night to urinate?: No Leakage of urine?: No Urine stream starts and stops?: No Trouble starting stream?: No Do you have to strain to urinate?: No  Blood in urine?: Yes Urinary tract infection?: No Sexually transmitted disease?: No Injury to kidneys or bladder?: No Painful intercourse?: No Weak stream?: No Erection problems?: No Penile pain?: No  Gastrointestinal Nausea?: Yes Vomiting?: No Indigestion/heartburn?: No Diarrhea?: No Constipation?: Yes  Constitutional Fever: No Night sweats?: No Weight loss?: Yes Fatigue?: Yes  Skin Skin rash/lesions?: No Itching?: No  Eyes Blurred vision?: No Double vision?: No  Ears/Nose/Throat Sore throat?: No Sinus problems?: Yes  Hematologic/Lymphatic Swollen glands?: No Easy bruising?: Yes  Cardiovascular Leg swelling?: No Chest pain?: No  Respiratory Cough?: No Shortness of breath?:  No  Endocrine Excessive thirst?: No  Musculoskeletal Back pain?: No Joint pain?: Yes  Neurological Headaches?: No Dizziness?: No  Psychologic Depression?: Yes Anxiety?: Yes  Physical Exam: BP 97/61 (BP Location: Left Arm, Patient Position: Sitting, Cuff Size: Normal)   Pulse 92   Ht 5\' 9"  (1.753 m)   Wt 142 lb 11.2 oz (64.7 kg)   BMI 21.07 kg/m   Constitutional:  Alert and oriented, No acute distress. HEENT: [place] AT, moist mucus membranes.  Trachea midline, no masses. Cardiovascular: No clubbing, cyanosis, or edema. Respiratory: Normal respiratory effort, no increased work of breathing. GI: Abdomen is soft, nontender, nondistended, no abdominal masses GU: 50 g flatter benign prostate; Foley catheter in place Skin: No rashes, bruises or suspicious lesions. Lymph: No cervical or inguinal adenopathy. Neurologic: Grossly intact, no focal deficits, moving all 4 extremities. Psychiatric: Normal mood and affect.  Laboratory Data: Lab Results  Component Value Date   WBC 4.2 09/22/2016   HGB 14.3 09/22/2016   HCT 41.8 09/22/2016   MCV 95.3 09/22/2016   PLT 178 09/22/2016    Lab Results  Component Value Date   CREATININE 1.17 09/22/2016    No results found for: PSA  No results found for: TESTOSTERONE  No results found for: HGBA1C  Urinalysis    Component Value Date/Time   COLORURINE YELLOW (A) 09/22/2016 0842   APPEARANCEUR CLEAR (A) 09/22/2016 0842   APPEARANCEUR CLEAR 12/20/2012 1040   LABSPEC 1.016 09/22/2016 0842   LABSPEC 1.020 12/20/2012 1040   PHURINE 6.0 09/22/2016 0842   GLUCOSEU NEGATIVE 09/22/2016 0842   GLUCOSEU NEGATIVE 12/20/2012 1040   HGBUR SMALL (A) 09/22/2016 0842   BILIRUBINUR NEGATIVE 09/22/2016 0842   BILIRUBINUR NEGATIVE 12/20/2012 1040   KETONESUR NEGATIVE 09/22/2016 0842   PROTEINUR NEGATIVE 09/22/2016 0842   NITRITE NEGATIVE 09/22/2016 0842   LEUKOCYTESUR NEGATIVE 09/22/2016 0842   LEUKOCYTESUR 1+ 12/20/2012 1040    Pertinent  Imaging: None  Assessment & Plan:  The patient had an acute onset of urinary retention. Pathophysiology discussed. He will return later today after a fill and pull  The patient came back voiding well. His urine was sent for culture. His residual was less than 20 mL  There are no diagnoses linked to this encounter.  No Follow-up on file.  Reece Packer, MD  [facility] [address] [place], [place] [address] [phone]

## 2016-10-03 NOTE — Addendum Note (Signed)
Addended by: Lestine Box on: 10/03/2016 04:35 PM   Modules accepted: Orders

## 2016-10-03 NOTE — Progress Notes (Signed)
Fill and Pull Catheter Removal  Patient is present today for a catheter removal.  Patient was cleaned and prepped in a sterile fashion 357ml of sterile water/ saline was instilled into the bladder when the patient felt the urge to urinate. 56ml of water was then drained from the balloon.  A 14FR foley cath was removed from the bladder no complications were noted .  Patient as then given some time to void on their own.  Patient can void  378ml on their own after some time.  Patient tolerated well.  Preformed by: Elberta Leatherwood, CMA  Follow up/ Additional notes: today at 3:00pm for PVR

## 2016-10-04 DIAGNOSIS — R630 Anorexia: Secondary | ICD-10-CM | POA: Diagnosis not present

## 2016-10-04 DIAGNOSIS — G4710 Hypersomnia, unspecified: Secondary | ICD-10-CM | POA: Diagnosis not present

## 2016-10-04 DIAGNOSIS — F3130 Bipolar disorder, current episode depressed, mild or moderate severity, unspecified: Secondary | ICD-10-CM | POA: Diagnosis not present

## 2016-10-04 LAB — MICROSCOPIC EXAMINATION: Epithelial Cells (non renal): NONE SEEN /hpf (ref 0–10)

## 2016-10-04 LAB — URINALYSIS, COMPLETE
BILIRUBIN UA: NEGATIVE
GLUCOSE, UA: NEGATIVE
KETONES UA: NEGATIVE
NITRITE UA: NEGATIVE
SPEC GRAV UA: 1.02 (ref 1.005–1.030)
Urobilinogen, Ur: 1 mg/dL (ref 0.2–1.0)
pH, UA: 6 (ref 5.0–7.5)

## 2016-10-06 LAB — CULTURE, URINE COMPREHENSIVE

## 2016-11-01 DIAGNOSIS — G4710 Hypersomnia, unspecified: Secondary | ICD-10-CM | POA: Diagnosis not present

## 2016-11-01 DIAGNOSIS — F314 Bipolar disorder, current episode depressed, severe, without psychotic features: Secondary | ICD-10-CM | POA: Diagnosis not present

## 2016-11-01 DIAGNOSIS — R630 Anorexia: Secondary | ICD-10-CM | POA: Diagnosis not present

## 2016-12-20 DIAGNOSIS — R630 Anorexia: Secondary | ICD-10-CM | POA: Diagnosis not present

## 2016-12-20 DIAGNOSIS — F314 Bipolar disorder, current episode depressed, severe, without psychotic features: Secondary | ICD-10-CM | POA: Diagnosis not present

## 2016-12-20 DIAGNOSIS — G4710 Hypersomnia, unspecified: Secondary | ICD-10-CM | POA: Diagnosis not present

## 2016-12-22 DIAGNOSIS — L738 Other specified follicular disorders: Secondary | ICD-10-CM | POA: Diagnosis not present

## 2016-12-22 DIAGNOSIS — D485 Neoplasm of uncertain behavior of skin: Secondary | ICD-10-CM | POA: Diagnosis not present

## 2016-12-22 DIAGNOSIS — L821 Other seborrheic keratosis: Secondary | ICD-10-CM | POA: Diagnosis not present

## 2017-01-01 ENCOUNTER — Encounter: Payer: Self-pay | Admitting: Gynecology

## 2017-01-01 ENCOUNTER — Ambulatory Visit
Admission: EM | Admit: 2017-01-01 | Discharge: 2017-01-01 | Disposition: A | Discharge status: Home / Self Care | Payer: Medicare HMO | Attending: Family Medicine | Admitting: Family Medicine

## 2017-01-01 ENCOUNTER — Other Ambulatory Visit: Payer: Self-pay

## 2017-01-01 DIAGNOSIS — W540XXA Bitten by dog, initial encounter: Secondary | ICD-10-CM

## 2017-01-01 DIAGNOSIS — S61452A Open bite of left hand, initial encounter: Secondary | ICD-10-CM | POA: Diagnosis not present

## 2017-01-01 MED ORDER — AMOXICILLIN-POT CLAVULANATE 875-125 MG PO TABS: 1.0000 | ORAL_TABLET | Freq: Two times a day (BID) | ORAL | 0 refills | Status: DC

## 2017-01-01 NOTE — ED Provider Notes (Signed)
[facility]    CSN: [id number] Arrival date & time: 01/01/17  0802  History   Chief Complaint Chief Complaint  Patient presents with  . Animal Bite   HPI   76 year old [name] presents for evaluation after suffering a dog bite.  Patient states that he was giving his grandsons dog a bone and when the dog reached with a bone he inadvertently bit his left hand as well.  Patient states that to his knowledge, the dog is healthy and up-to-date on vaccines.  This occurred at 4 PM yesterday.  He was going to come by for evaluation but did not do so as we were closing.  His wound was cleaned and pressure was applied to stop the bleeding.  No reports of fevers or chills.  No reports of redness.  He does report pain.  Pain is currently 5/10 in severity.  Patient suffered a skin tear on the dorsal surface of his left hand.  The bite occurred at the base of the fifth digit.  No reports of drainage currently.  No other complaints or concerns at this time.  Past Medical History:  Diagnosis Date  . Allergic rhinitis   . Anemia   . Bipolar disorder ([place])   . Elevated PSA    had biopsy at [facility] around 2009-2010 negative for malignancy  . Erectile dysfunction   . GERD (gastroesophageal reflux disease)   . Leucopenia   . Osteoarthritis    in hands  . Prostatitis   . Sigmoid polyp    Patient Active Problem List   Diagnosis Date Noted  . Lymphopenia 06/07/2016  . Abnormal weight loss 06/07/2016  . Community acquired pneumonia of left lower lobe of lung ([place])   . Pneumonia of left lower lobe due to Streptococcus pneumoniae ([place])   . Acute respiratory failure ([place])   . Bipolar disorder with moderate depression ([place]) 03/01/2016  . Hypotension 02/29/2016  . IDA (iron deficiency anemia) 06/19/2014  . Leucopenia 06/19/2014    Past Surgical History:  Procedure Laterality Date  . COLONOSCOPY  10/20/12  . HERNIA REPAIR    . Left knee replacement    . Right shoulder replacement       Home Medications    Prior to Admission medications   Medication Sig Start Date End Date Taking? Authorizing Provider  acetaminophen (TYLENOL) 500 MG tablet Take 500 mg by mouth every 8 (eight) hours as needed for moderate pain.    Yes [provider]  docusate sodium (COLACE) 100 MG capsule Take 1 capsule (100 mg total) by mouth daily. 03/03/16  Yes Hillary Bow, MD  Multiple Vitamin (MULTIVITAMIN) tablet Take 1 tablet by mouth daily.   Yes [provider]  multivitamin-lutein (OCUVITE-LUTEIN) CAPS capsule Take 1 capsule by mouth daily.   Yes [provider]  nortriptyline (PAMELOR) 25 MG capsule Take 50 mg by mouth at bedtime. 09/09/16  Yes [provider]  amoxicillin-clavulanate (AUGMENTIN) 875-125 MG tablet Take 1 tablet every 12 (twelve) hours by mouth. 01/01/17   Coral Spikes, DO  predniSONE (DELTASONE) 20 MG tablet Take 1 tablet (20 mg total) by mouth daily with breakfast. Once a day with food. Patient not taking: Reported on 09/22/2016 06/07/16   Cammie Sickle, MD    Family History Family History  Problem Relation Age of Onset  . CVA Mother   . CAD Father   . Prostate cancer Neg Hx   . Kidney cancer Neg Hx   . Bladder  Cancer Neg Hx     Social History Social History   Tobacco Use  . Smoking status: Former Smoker    Types: Cigarettes    Last attempt to quit: 02/22/1975    Years since quitting: 41.8  . Smokeless tobacco: Never Used  Substance Use Topics  . Alcohol use: No    Alcohol/week: 0.0 oz  . Drug use: Not on file   Allergies   Quetiapine   Review of Systems Review of Systems  Constitutional: Negative.   Skin: Positive for wound.  All other systems reviewed and are negative.  Physical Exam Triage Vital Signs ED Triage Vitals [01/01/17 0833]  Enc Vitals Group     BP 116/69     Pulse Rate 70     Resp 16     Temp 98.1 F (36.7 C)     Temp Source Oral     SpO2 100 %     Weight 140 lb (63.5 kg)     Height  5\' 9"  (1.753 m)     Head Circumference      Peak Flow      Pain Score 5     Pain Loc      Pain Edu?      Excl. in [place]?    Updated Vital Signs BP 116/69 (BP Location: Right Arm)   Pulse 70   Temp 98.1 F (36.7 C) (Oral)   Resp 16   Ht 5\' 9"  (1.753 m)   Wt 140 lb (63.5 kg)   SpO2 100%   BMI 20.67 kg/m   Physical Exam  Constitutional: He is oriented to person, place, and time. He appears well-developed. No distress.  HENT:  Head: Normocephalic and atraumatic.  Nose: Nose normal.  Eyes: Conjunctivae are normal. No scleral icterus.  Neck: Normal range of motion. No tracheal deviation present.  Cardiovascular: Normal rate and regular rhythm.  No murmur heard. Pulmonary/Chest: Effort normal and breath sounds normal. No respiratory distress.  Abdominal: Soft. He exhibits no distension.  Neurological: He is alert and oriented to person, place, and time. He exhibits normal muscle tone.  Skin:  Left hand -skin tear noted on the dorsal surface of the hand.  Patient has a laceration at the base of the fifth digit extending proximally into the palm.  Approximately 5 cm.  No bleeding.  No erythema. 2+ radial pulse.   Psychiatric: He has a normal mood and affect. His behavior is normal.  Vitals reviewed.  UC Treatments / Results  Labs (all labs ordered are listed, but only abnormal results are displayed) Labs Reviewed - No data to display  EKG  EKG Interpretation None       Radiology No results found.  Procedures Procedures (including critical care time)  Medications Ordered in UC Medications - No data to display   Initial Impression / Assessment and Plan / UC Course  I have reviewed the triage vital signs and the nursing notes.  Pertinent labs & imaging results that were available during my care of the patient were reviewed by me and considered in my medical decision making (see chart for details).     76 year old [name] presents for evaluation after dog bite.  It has  now been 16 hours.  Wound is on the left hand.  Per literature/guidelines, this wound could not be closed with suture due to duration as well as high risk of infection due to location and mechanism of injury.  Wound was approximated with Steri-Strips.  Skin tear  was dressed.  Placing on empiric Augmentin to cover for infection.  Notified appropriate authorities regarding dog bite.  Final Clinical Impressions(s) / UC Diagnoses   Final diagnoses:  Dog bite, initial encounter    ED Discharge Orders        Ordered    amoxicillin-clavulanate (AUGMENTIN) 875-125 MG tablet  Every 12 hours     01/01/17 0901     Controlled Substance Prescriptions [place] Controlled Substance Registry consulted? Not Applicable   Coral Spikes, [place] 01/01/17 6808

## 2017-01-01 NOTE — Discharge Instructions (Signed)
Keep the area dry so the steri strips stay on. They will peel off in 7-10 days.  Keep the skin tear covered.  Antibiotic as prescribed.  Take care  Dr. Lacinda Axon

## 2017-01-01 NOTE — ED Triage Notes (Signed)
Patient with dog bite to his left hand. Per patient was bitten by their grandson dog x yesterday at 4 pm.

## 2017-01-19 DIAGNOSIS — F419 Anxiety disorder, unspecified: Secondary | ICD-10-CM | POA: Diagnosis not present

## 2017-01-19 DIAGNOSIS — F322 Major depressive disorder, single episode, severe without psychotic features: Secondary | ICD-10-CM | POA: Diagnosis not present

## 2017-01-24 DIAGNOSIS — F314 Bipolar disorder, current episode depressed, severe, without psychotic features: Secondary | ICD-10-CM | POA: Diagnosis not present

## 2017-01-24 DIAGNOSIS — R630 Anorexia: Secondary | ICD-10-CM | POA: Diagnosis not present

## 2017-01-24 DIAGNOSIS — G4710 Hypersomnia, unspecified: Secondary | ICD-10-CM | POA: Diagnosis not present

## 2017-01-27 DIAGNOSIS — K5909 Other constipation: Secondary | ICD-10-CM | POA: Diagnosis not present

## 2017-01-27 DIAGNOSIS — F314 Bipolar disorder, current episode depressed, severe, without psychotic features: Secondary | ICD-10-CM | POA: Diagnosis not present

## 2017-01-27 DIAGNOSIS — R634 Abnormal weight loss: Secondary | ICD-10-CM | POA: Diagnosis not present

## 2017-02-23 DIAGNOSIS — L821 Other seborrheic keratosis: Secondary | ICD-10-CM | POA: Diagnosis not present

## 2017-02-28 DIAGNOSIS — M2041 Other hammer toe(s) (acquired), right foot: Secondary | ICD-10-CM | POA: Diagnosis not present

## 2017-02-28 DIAGNOSIS — M2042 Other hammer toe(s) (acquired), left foot: Secondary | ICD-10-CM | POA: Diagnosis not present

## 2017-04-06 ENCOUNTER — Other Ambulatory Visit: Payer: Self-pay | Admitting: Nurse Practitioner

## 2017-04-06 DIAGNOSIS — F314 Bipolar disorder, current episode depressed, severe, without psychotic features: Secondary | ICD-10-CM | POA: Diagnosis not present

## 2017-04-06 DIAGNOSIS — F5104 Psychophysiologic insomnia: Secondary | ICD-10-CM | POA: Diagnosis not present

## 2017-04-06 DIAGNOSIS — R51 Headache: Principal | ICD-10-CM

## 2017-04-06 DIAGNOSIS — G8929 Other chronic pain: Secondary | ICD-10-CM

## 2017-04-06 DIAGNOSIS — R413 Other amnesia: Secondary | ICD-10-CM | POA: Diagnosis not present

## 2017-04-13 ENCOUNTER — Other Ambulatory Visit: Payer: Self-pay | Admitting: Internal Medicine

## 2017-04-13 DIAGNOSIS — N400 Enlarged prostate without lower urinary tract symptoms: Secondary | ICD-10-CM | POA: Diagnosis not present

## 2017-04-13 DIAGNOSIS — I700 Atherosclerosis of aorta: Secondary | ICD-10-CM | POA: Diagnosis not present

## 2017-04-13 DIAGNOSIS — F319 Bipolar disorder, unspecified: Secondary | ICD-10-CM | POA: Diagnosis not present

## 2017-04-13 DIAGNOSIS — K5909 Other constipation: Secondary | ICD-10-CM | POA: Diagnosis not present

## 2017-04-13 DIAGNOSIS — D509 Iron deficiency anemia, unspecified: Secondary | ICD-10-CM | POA: Diagnosis not present

## 2017-04-13 DIAGNOSIS — E7800 Pure hypercholesterolemia, unspecified: Secondary | ICD-10-CM | POA: Diagnosis not present

## 2017-04-13 DIAGNOSIS — N183 Chronic kidney disease, stage 3 (moderate): Secondary | ICD-10-CM | POA: Diagnosis not present

## 2017-04-13 DIAGNOSIS — G3184 Mild cognitive impairment, so stated: Secondary | ICD-10-CM | POA: Diagnosis not present

## 2017-04-13 DIAGNOSIS — Z0000 Encounter for general adult medical examination without abnormal findings: Secondary | ICD-10-CM | POA: Diagnosis not present

## 2017-04-13 DIAGNOSIS — F3175 Bipolar disorder, in partial remission, most recent episode depressed: Secondary | ICD-10-CM | POA: Diagnosis not present

## 2017-04-13 DIAGNOSIS — D72819 Decreased white blood cell count, unspecified: Secondary | ICD-10-CM | POA: Diagnosis not present

## 2017-04-14 ENCOUNTER — Ambulatory Visit
Admission: RE | Admit: 2017-04-14 | Discharge: 2017-04-14 | Disposition: A | Discharge status: Home / Self Care | Payer: Medicare HMO | Source: Ambulatory Visit | Attending: Nurse Practitioner | Admitting: Nurse Practitioner

## 2017-04-14 DIAGNOSIS — G319 Degenerative disease of nervous system, unspecified: Secondary | ICD-10-CM | POA: Insufficient documentation

## 2017-04-14 DIAGNOSIS — I6782 Cerebral ischemia: Secondary | ICD-10-CM | POA: Diagnosis not present

## 2017-04-14 DIAGNOSIS — G44229 Chronic tension-type headache, not intractable: Secondary | ICD-10-CM | POA: Diagnosis not present

## 2017-04-14 DIAGNOSIS — R600 Localized edema: Secondary | ICD-10-CM | POA: Insufficient documentation

## 2017-04-14 DIAGNOSIS — R519 Headache, unspecified: Secondary | ICD-10-CM

## 2017-04-14 DIAGNOSIS — R51 Headache: Secondary | ICD-10-CM | POA: Diagnosis not present

## 2017-04-18 ENCOUNTER — Other Ambulatory Visit: Payer: Self-pay | Admitting: Internal Medicine

## 2017-04-18 DIAGNOSIS — N390 Urinary tract infection, site not specified: Secondary | ICD-10-CM | POA: Diagnosis not present

## 2017-04-18 DIAGNOSIS — N5089 Other specified disorders of the male genital organs: Secondary | ICD-10-CM

## 2017-04-27 ENCOUNTER — Ambulatory Visit
Admission: RE | Admit: 2017-04-27 | Discharge: 2017-04-27 | Disposition: A | Discharge status: Home / Self Care | Payer: Medicare HMO | Source: Ambulatory Visit | Attending: Internal Medicine | Admitting: Internal Medicine

## 2017-04-27 DIAGNOSIS — N509 Disorder of male genital organs, unspecified: Secondary | ICD-10-CM | POA: Diagnosis present

## 2017-04-27 DIAGNOSIS — N5089 Other specified disorders of the male genital organs: Secondary | ICD-10-CM

## 2017-04-27 DIAGNOSIS — N433 Hydrocele, unspecified: Secondary | ICD-10-CM | POA: Insufficient documentation

## 2017-04-27 DIAGNOSIS — N503 Cyst of epididymis: Secondary | ICD-10-CM | POA: Diagnosis not present

## 2017-05-11 DIAGNOSIS — N390 Urinary tract infection, site not specified: Secondary | ICD-10-CM | POA: Diagnosis not present

## 2017-06-19 ENCOUNTER — Ambulatory Visit: Payer: Self-pay

## 2017-07-03 ENCOUNTER — Ambulatory Visit: Payer: Self-pay | Admitting: Urology

## 2017-07-26 DIAGNOSIS — H2513 Age-related nuclear cataract, bilateral: Secondary | ICD-10-CM | POA: Diagnosis not present

## 2017-07-26 DIAGNOSIS — H353131 Nonexudative age-related macular degeneration, bilateral, early dry stage: Secondary | ICD-10-CM | POA: Diagnosis not present

## 2017-07-31 DIAGNOSIS — L570 Actinic keratosis: Secondary | ICD-10-CM | POA: Diagnosis not present

## 2017-07-31 DIAGNOSIS — L821 Other seborrheic keratosis: Secondary | ICD-10-CM | POA: Diagnosis not present

## 2017-07-31 DIAGNOSIS — M2042 Other hammer toe(s) (acquired), left foot: Secondary | ICD-10-CM | POA: Diagnosis not present

## 2017-07-31 DIAGNOSIS — M2041 Other hammer toe(s) (acquired), right foot: Secondary | ICD-10-CM | POA: Diagnosis not present

## 2017-08-01 ENCOUNTER — Encounter: Payer: Self-pay | Admitting: Urology

## 2017-08-01 ENCOUNTER — Ambulatory Visit (INDEPENDENT_AMBULATORY_CARE_PROVIDER_SITE_OTHER): Payer: Medicare HMO | Admitting: Urology

## 2017-08-01 VITALS — BP 116/64 | HR 67 | Ht 69.0 in | Wt 145.6 lb

## 2017-08-01 DIAGNOSIS — R3129 Other microscopic hematuria: Secondary | ICD-10-CM

## 2017-08-01 DIAGNOSIS — R339 Retention of urine, unspecified: Secondary | ICD-10-CM | POA: Diagnosis not present

## 2017-08-01 DIAGNOSIS — R8281 Pyuria: Secondary | ICD-10-CM

## 2017-08-01 DIAGNOSIS — N390 Urinary tract infection, site not specified: Secondary | ICD-10-CM

## 2017-08-01 DIAGNOSIS — N200 Calculus of kidney: Secondary | ICD-10-CM

## 2017-08-01 LAB — URINALYSIS, COMPLETE
Bilirubin, UA: NEGATIVE
Glucose, UA: NEGATIVE
KETONES UA: NEGATIVE
NITRITE UA: NEGATIVE
PH UA: 6.5 (ref 5.0–7.5)
Protein, UA: NEGATIVE
SPEC GRAV UA: 1.015 (ref 1.005–1.030)
Urobilinogen, Ur: 1 mg/dL (ref 0.2–1.0)

## 2017-08-01 LAB — MICROSCOPIC EXAMINATION

## 2017-08-01 NOTE — Progress Notes (Signed)
08/01/2017 2:54 PM   David [name] 05-13-1940 [id number]  Referring provider: Ezequiel Kayser, MD [facility] [place], [place] [address]  Chief Complaint  Patient presents with  . Urinary Tract Infection    HPI: Patient is a 77 year old Caucasian male with a history of urinary retention who presents today as a referral from Dr. Raechel Ache for sterile pyuria.  Patient has had several UA positive for WBC's and RBC's.  Two cultures were performed in 03/2017 and 04/2017.  One was positive for staphylococcus epidermidis.  His other urine culture was negative.    At this time, he is not having urinary symptoms.  Patient denies any gross hematuria, dysuria or suprapubic/flank pain.  Patient denies any fevers, chills, nausea or vomiting.   His UA is positive for 11-30 WBCs, 11-30 RBCs and many bacteria.  His PVR today is 147 mL.   He is not on the Flomax 0.4 mg daily.    Contrast CT in 05/2016 noted no acute findings in the chest, abdomen, or pelvis.  Abdominal aortic atherosclerosis. Small penetrating ulcer or ulcerated plaque posterior wall aortic bifurcation. Follow-up CTA of the abdomen and pelvis in 1 year is recommended to assess stability.  Bilateral nonobstructing renal stones.  Prostatomegaly.  PMH: Past Medical History:  Diagnosis Date  . Allergic rhinitis   . Anemia   . Bipolar disorder ([place])   . Elevated PSA    had biopsy at [facility] around 2009-2010 negative for malignancy  . Erectile dysfunction   . GERD (gastroesophageal reflux disease)   . Leucopenia   . Osteoarthritis    in hands  . Prostatitis   . Sigmoid polyp     Surgical History: Past Surgical History:  Procedure Laterality Date  . COLONOSCOPY  10/20/12  . HERNIA REPAIR    . Left knee replacement    . Right shoulder replacement      Home Medications:  Allergies as of 08/01/2017      Reactions   Quetiapine Anxiety   Restlessness, worsening anxiety  Restlessness, worsening anxiety       Medication List        Accurate as of 08/01/17 11:59 PM. Always use your most recent med list.          acetaminophen 500 MG tablet Commonly known as:  TYLENOL Take 500 mg by mouth every 8 (eight) hours as needed for moderate pain.   amoxicillin-clavulanate 875-125 MG tablet Commonly known as:  AUGMENTIN Take 1 tablet every 12 (twelve) hours by mouth.   amphetamine-dextroamphetamine 5 MG tablet Commonly known as:  ADDERALL Please take one tablet two times per day: once in the morning when you wake up, and once in the mid-late afternoon.   azelastine 0.1 % nasal spray Commonly known as:  ASTELIN Place into the nose.   Blood Pressure Monitor Devi   D 1000 1000 units capsule Generic drug:  Cholecalciferol Take by mouth.   docusate sodium 100 MG capsule Commonly known as:  COLACE Take 1 capsule (100 mg total) by mouth daily.   FIRST AID Kit   lactulose 10 GM/15ML solution Commonly known as:  CHRONULAC TAKE 30 ML BY MOUTH NIGHTLY   multivitamin tablet Take 1 tablet by mouth daily.   multivitamin-lutein Caps capsule Take 1 capsule by mouth daily.   nortriptyline 25 MG capsule Commonly known as:  PAMELOR Take 50 mg by mouth at bedtime.   omeprazole 20 MG capsule Commonly known as:  PRILOSEC  predniSONE 20 MG tablet Commonly known as:  DELTASONE Take 1 tablet (20 mg total) by mouth daily with breakfast. Once a day with food.       Allergies:  Allergies  Allergen Reactions  . Quetiapine Anxiety    Restlessness, worsening anxiety  Restlessness, worsening anxiety     Family History: Family History  Problem Relation Age of Onset  . CVA Mother   . CAD Father   . Prostate cancer Neg Hx   . Kidney cancer Neg Hx   . Bladder Cancer Neg Hx     Social History:  reports that he quit smoking about 42 years ago. His smoking use included cigarettes. He has never used smokeless tobacco. He reports that he does not drink alcohol or use  drugs.  ROS: UROLOGY Frequent Urination?: No Hard to postpone urination?: No Burning/pain with urination?: No Get up at night to urinate?: No Leakage of urine?: No Urine stream starts and stops?: No Trouble starting stream?: No Do you have to strain to urinate?: No Blood in urine?: No Urinary tract infection?: No Sexually transmitted disease?: No Injury to kidneys or bladder?: No Painful intercourse?: No Weak stream?: No Erection problems?: No Penile pain?: No  Gastrointestinal Nausea?: No Vomiting?: No Indigestion/heartburn?: No Diarrhea?: No Constipation?: No  Constitutional Fever: No Night sweats?: No Weight loss?: No Fatigue?: No  Skin Skin rash/lesions?: No Itching?: No  Eyes Blurred vision?: No Double vision?: No  Ears/Nose/Throat Sore throat?: No Sinus problems?: No  Hematologic/Lymphatic Swollen glands?: No Easy bruising?: No  Cardiovascular Leg swelling?: No Chest pain?: No  Respiratory Cough?: No Shortness of breath?: No  Endocrine Excessive thirst?: No  Musculoskeletal Back pain?: No Joint pain?: No  Neurological Headaches?: No Dizziness?: No  Psychologic Depression?: No Anxiety?: No  Physical Exam: BP 116/64 (BP Location: Right Arm, Patient Position: Sitting, Cuff Size: Normal)   Pulse 67   Ht '5\' 9"'$  (1.753 m)   Wt 145 lb 9.6 oz (66 kg)   BMI 21.50 kg/m   Constitutional:  Well nourished. Alert and oriented, No acute distress. HEENT: [place] AT, moist mucus membranes.  Trachea midline, no masses. Cardiovascular: No clubbing, cyanosis, or edema. Respiratory: Normal respiratory effort, no increased work of breathing. Skin: No rashes, bruises or suspicious lesions. Lymph: No cervical or inguinal adenopathy. Neurologic: Grossly intact, no focal deficits, moving all 4 extremities. Psychiatric: Normal mood and affect.  Laboratory Data: Lab Results  Component Value Date   WBC 4.2 09/22/2016   HGB 14.3 09/22/2016   HCT 41.8  09/22/2016   MCV 95.3 09/22/2016   PLT 178 09/22/2016    Lab Results  Component Value Date   CREATININE 1.17 09/22/2016    No results found for: PSA  No results found for: TESTOSTERONE  No results found for: HGBA1C  Lab Results  Component Value Date   TSH 0.06 (L) 05/10/2011    No results found for: CHOL, HDL, CHOLHDL, VLDL, LDLCALC  Lab Results  Component Value Date   AST 27 09/22/2016   Lab Results  Component Value Date   ALT 14 (L) 09/22/2016   No components found for: ALKALINEPHOPHATASE No components found for: BILIRUBINTOTAL  No results found for: ESTRADIOL  Urinalysis 11-30 WBC's.  11-30 RBC's.  Many bacteria.  See Epic.   I have reviewed the labs.   Pertinent Imaging: CLINICAL DATA:  Leukopenia.  EXAM: CT CHEST, ABDOMEN, AND PELVIS WITH CONTRAST  TECHNIQUE: Multidetector CT imaging of the chest, abdomen and pelvis was performed following the  standard protocol during bolus administration of intravenous contrast.  CONTRAST:  174m ISOVUE-300 IOPAMIDOL (ISOVUE-300) INJECTION 61%  COMPARISON:  None.  FINDINGS: CT CHEST FINDINGS  Cardiovascular: Heart size normal.  No thoracic aortic aneurysm.  Mediastinum/Nodes: No mediastinal lymphadenopathy. There is no hilar lymphadenopathy. Contrast material in the esophagus may be related to dysmotility or reflux.  Lungs/Pleura: Calcified granuloma identified posterior left lower lobe. No focal airspace consolidation. No pulmonary edema or pleural effusion.  Musculoskeletal: Patient is status post right shoulder replacement.  CT ABDOMEN PELVIS FINDINGS  Hepatobiliary: Scattered well-defined low-density liver lesions of varying size cannot be definitively characterize but are likely cysts. No enhancing abnormality within the liver parenchyma. There is no evidence for gallstones, gallbladder wall thickening, or pericholecystic fluid. No intrahepatic or extrahepatic  biliary dilation.  Pancreas: No focal mass lesion. No dilatation of the main duct. No intraparenchymal cyst. No peripancreatic edema.  Spleen: No splenomegaly. No focal mass lesion.  Adrenals/Urinary Tract: No adrenal nodule or mass. Two stones are seen in the right kidney measuring 3 mm in the interpolar region and 8 mm in the lower pole. Multiple scattered stones are seen in the left kidney ranging in size from 2 mm up to 8 mm. No hydronephrosis in either kidney. No hydroureter. The urinary bladder appears normal for the degree of distention.  Stomach/Bowel: Stomach is nondistended. No gastric wall thickening. No evidence of outlet obstruction. Duodenum is normally positioned as is the ligament of Treitz. No small bowel wall thickening. No small bowel dilatation. The terminal ileum is normal. The appendix is not visualized, but there is no edema or inflammation in the region of the cecum. Diverticular changes are noted in the left colon without evidence of diverticulitis.  Vascular/Lymphatic: There is abdominal aortic atherosclerosis without aneurysm. Right common iliac artery measures 1.7 cm diameter. Small penetrating ulcer or ulcerated plaque noted posterior wall aortic bifurcation. There is no gastrohepatic or hepatoduodenal ligament lymphadenopathy. No intraperitoneal or retroperitoneal lymphadenopathy. No pelvic sidewall lymphadenopathy.  Reproductive: Prostate gland is enlarged.  Other: No intraperitoneal free fluid.  Musculoskeletal: Bone windows reveal no worrisome lytic or sclerotic osseous lesions.  IMPRESSION: 1. No acute findings in the chest, abdomen, or pelvis. 2. Abdominal aortic atherosclerosis. Small penetrating ulcer or ulcerated plaque posterior wall aortic bifurcation. Follow-up CTA of the abdomen and pelvis in 1 year is recommended to assess stability. 3. Bilateral nonobstructing renal stones. 4. Prostatomegaly.   Electronically  Signed   By: EMisty StanleyM.D.   On: 06/14/2016 15:35  I have independently reviewed the films.    Assessment & Plan:   1. Sterile pyuria Explained to the patient that screening for, or treating, asymptomatic bacteriuria is not recommended, except in pregnant patients per infectious disease guidelines. - Urinalysis, Complete  2. Bilateral non obstructing renal stones Will obtain a RUS at this time - will contact patient with results  3. Microscopic hematuria Patient has not had a formal hematuria workup with CTU and cystoscopy - UA may be representative of his bilateral stones Contrast CT in 2018 did not note any renal malignancies - RUS is pending Will need to consider cystoscopy in the future   Return for I will call patient with results.  These notes generated with voice recognition software. I apologize for typographical errors.  SZara Council PA-C  [facility] [address] [place] [phone] Patient not charged for visit due to wait time.

## 2017-08-03 ENCOUNTER — Ambulatory Visit: Payer: Self-pay

## 2017-08-14 DIAGNOSIS — R51 Headache: Secondary | ICD-10-CM | POA: Diagnosis not present

## 2017-08-14 DIAGNOSIS — F418 Other specified anxiety disorders: Secondary | ICD-10-CM | POA: Diagnosis not present

## 2017-08-14 DIAGNOSIS — R413 Other amnesia: Secondary | ICD-10-CM | POA: Diagnosis not present

## 2017-08-14 DIAGNOSIS — K120 Recurrent oral aphthae: Secondary | ICD-10-CM | POA: Diagnosis not present

## 2017-08-14 DIAGNOSIS — F5104 Psychophysiologic insomnia: Secondary | ICD-10-CM | POA: Diagnosis not present

## 2017-08-14 DIAGNOSIS — K5909 Other constipation: Secondary | ICD-10-CM | POA: Diagnosis not present

## 2017-08-19 ENCOUNTER — Telehealth: Payer: Self-pay | Admitting: Urology

## 2017-08-19 NOTE — Telephone Encounter (Signed)
Please check on the status of Mr. David [name]'s RUS.  I ordered it on 08/01/2017 and it hasn't been scheduled.

## 2017-08-21 DIAGNOSIS — H903 Sensorineural hearing loss, bilateral: Secondary | ICD-10-CM | POA: Diagnosis not present

## 2017-08-21 DIAGNOSIS — B001 Herpesviral vesicular dermatitis: Secondary | ICD-10-CM | POA: Diagnosis not present

## 2017-08-21 DIAGNOSIS — H6123 Impacted cerumen, bilateral: Secondary | ICD-10-CM | POA: Diagnosis not present

## 2017-08-21 DIAGNOSIS — K120 Recurrent oral aphthae: Secondary | ICD-10-CM | POA: Diagnosis not present

## 2017-08-25 DIAGNOSIS — L820 Inflamed seborrheic keratosis: Secondary | ICD-10-CM | POA: Diagnosis not present

## 2017-08-29 NOTE — Telephone Encounter (Signed)
Scheduling called the patient to schedule and he stated that he did not want to schedule at this time. He told them that he would call back when he was ready to schedule.  Sharyn Lull

## 2017-09-03 ENCOUNTER — Encounter: Payer: Self-pay | Admitting: Urology

## 2017-09-11 NOTE — Progress Notes (Signed)
09/12/2017 11:18 AM   David [name] 16-May-1940 [id number]  Referring provider: Ezequiel Kayser, MD [facility] [place], [place] [address]  Chief Complaint  Patient presents with  . Follow-up    HPI: Patient is a 77 year old Caucasian male with a history of urinary retention and sterile pyuria who presents today for his kidney hurting.    Background history Patient is a 77 year old Caucasian male with a history of urinary retention who presents today as a referral from Dr. Raechel Ache for sterile pyuria.  Patient has had several UA positive for WBC's and RBC's.  Two cultures were performed in 03/2017 and 04/2017.  One was positive for staphylococcus epidermidis.  His other urine culture was negative.   At this time, he is not having urinary symptoms.  Patient denies any gross hematuria, dysuria or suprapubic/flank pain.  Patient denies any fevers, chills, nausea or vomiting.   His UA is positive for 11-30 WBCs, 11-30 RBCs and many bacteria.  His PVR today is 147 mL.   He is not on the Flomax 0.4 mg daily.  Contrast CT in 05/2016 noted no acute findings in the chest, abdomen, or pelvis.  Abdominal aortic atherosclerosis. Small penetrating ulcer or ulcerated plaque posterior wall aortic bifurcation. Follow-up CTA of the abdomen and pelvis in 1 year is recommended to assess stability.  Bilateral nonobstructing renal stones.  Prostatomegaly.    He is not having urinary symptoms at this time.  Patient denies any gross hematuria, dysuria or suprapubic/flank pain.  Patient denies any fevers, chills, nausea or vomiting.  His UA is positive for 11-30 WBC's, 3-10 RBC's, many bacteria and nitrite positive.  His PVR is 0 mL.      PMH: Past Medical History:  Diagnosis Date  . Allergic rhinitis   . Anemia   . Bipolar disorder ([place])   . Elevated PSA    had biopsy at [facility] around 2009-2010 negative for malignancy  . Erectile dysfunction   . GERD (gastroesophageal reflux disease)     . Leucopenia   . Osteoarthritis    in hands  . Prostatitis   . Sigmoid polyp     Surgical History: Past Surgical History:  Procedure Laterality Date  . COLONOSCOPY  10/20/12  . HERNIA REPAIR    . Left knee replacement    . Right shoulder replacement      Home Medications:  Allergies as of 09/12/2017      Reactions   Quetiapine Anxiety   Restlessness, worsening anxiety  Restlessness, worsening anxiety       Medication List        Accurate as of 09/12/17 11:18 AM. Always use your most recent med list.          acetaminophen 500 MG tablet Commonly known as:  TYLENOL Take 500 mg by mouth every 8 (eight) hours as needed for moderate pain.   amoxicillin-clavulanate 875-125 MG tablet Commonly known as:  AUGMENTIN Take 1 tablet every 12 (twelve) hours by mouth.   amphetamine-dextroamphetamine 5 MG tablet Commonly known as:  ADDERALL Please take one tablet two times per day: once in the morning when you wake up, and once in the mid-late afternoon.   azelastine 0.1 % nasal spray Commonly known as:  ASTELIN Place into the nose.   Blood Pressure Monitor Devi   D 1000 1000 units capsule Generic drug:  Cholecalciferol Take by mouth.   docusate sodium 100 MG capsule Commonly known as:  COLACE Take  1 capsule (100 mg total) by mouth daily.   FIRST AID Kit   lactulose 10 GM/15ML solution Commonly known as:  CHRONULAC TAKE 30 ML BY MOUTH NIGHTLY   multivitamin tablet Take 1 tablet by mouth daily.   multivitamin-lutein Caps capsule Take 1 capsule by mouth daily.   nortriptyline 25 MG capsule Commonly known as:  PAMELOR Take 50 mg by mouth at bedtime.   omeprazole 20 MG capsule Commonly known as:  PRILOSEC   predniSONE 20 MG tablet Commonly known as:  DELTASONE Take 1 tablet (20 mg total) by mouth daily with breakfast. Once a day with food.       Allergies:  Allergies  Allergen Reactions  . Quetiapine Anxiety    Restlessness, worsening anxiety   Restlessness, worsening anxiety     Family History: Family History  Problem Relation Age of Onset  . CVA Mother   . CAD Father   . Prostate cancer Neg Hx   . Kidney cancer Neg Hx   . Bladder Cancer Neg Hx     Social History:  reports that he quit smoking about 42 years ago. His smoking use included cigarettes. He has never used smokeless tobacco. He reports that he does not drink alcohol or use drugs.  ROS: UROLOGY Frequent Urination?: No Hard to postpone urination?: No Burning/pain with urination?: No Get up at night to urinate?: No Leakage of urine?: No Urine stream starts and stops?: No Trouble starting stream?: No Do you have to strain to urinate?: No Blood in urine?: No Urinary tract infection?: No Sexually transmitted disease?: No Injury to kidneys or bladder?: No Painful intercourse?: No Weak stream?: No Erection problems?: No Penile pain?: No  Gastrointestinal Nausea?: No Vomiting?: No Indigestion/heartburn?: No Diarrhea?: No Constipation?: No  Constitutional Fever: No Night sweats?: No Weight loss?: No Fatigue?: No  Skin Skin rash/lesions?: No Itching?: No  Eyes Blurred vision?: No Double vision?: No  Ears/Nose/Throat Sore throat?: No Sinus problems?: No  Hematologic/Lymphatic Swollen glands?: No Easy bruising?: No  Cardiovascular Leg swelling?: No Chest pain?: No  Respiratory Cough?: No Shortness of breath?: No  Endocrine Excessive thirst?: No  Musculoskeletal Back pain?: No Joint pain?: Yes  Neurological Headaches?: No Dizziness?: No  Psychologic Depression?: No Anxiety?: No  Physical Exam: BP 100/63   Pulse 64   Resp 16   Ht '5\' 9"'$  (1.753 m)   Wt 144 lb 3.2 oz (65.4 kg)   SpO2 96%   BMI 21.29 kg/m   Constitutional:  Well nourished. Alert and oriented, No acute distress. HEENT: [place] AT, moist mucus membranes.  Trachea midline, no masses. Cardiovascular: No clubbing, cyanosis, or edema. Respiratory: Normal  respiratory effort, no increased work of breathing. Skin: No rashes, bruises or suspicious lesions. Lymph: No cervical or inguinal adenopathy. Neurologic: Grossly intact, no focal deficits, moving all 4 extremities. Psychiatric: Normal mood and affect.  Laboratory Data: Lab Results  Component Value Date   WBC 4.2 09/22/2016   HGB 14.3 09/22/2016   HCT 41.8 09/22/2016   MCV 95.3 09/22/2016   PLT 178 09/22/2016    Lab Results  Component Value Date   CREATININE 1.17 09/22/2016    No results found for: PSA  No results found for: TESTOSTERONE  No results found for: HGBA1C  Lab Results  Component Value Date   TSH 0.06 (L) 05/10/2011    No results found for: CHOL, HDL, CHOLHDL, VLDL, LDLCALC  Lab Results  Component Value Date   AST 27 09/22/2016   Lab Results  Component Value Date   ALT 14 (L) 09/22/2016   No components found for: ALKALINEPHOPHATASE No components found for: BILIRUBINTOTAL  No results found for: ESTRADIOL  Urinalysis 11-30 WBC's.  3-1 RBC's.  Many bacteria.  See Epic.   I have reviewed the labs.   Pertinent Imaging: CLINICAL DATA:  Leukopenia.  EXAM: CT CHEST, ABDOMEN, AND PELVIS WITH CONTRAST  TECHNIQUE: Multidetector CT imaging of the chest, abdomen and pelvis was performed following the standard protocol during bolus administration of intravenous contrast.  CONTRAST:  120m ISOVUE-300 IOPAMIDOL (ISOVUE-300) INJECTION 61%  COMPARISON:  None.  FINDINGS: CT CHEST FINDINGS  Cardiovascular: Heart size normal.  No thoracic aortic aneurysm.  Mediastinum/Nodes: No mediastinal lymphadenopathy. There is no hilar lymphadenopathy. Contrast material in the esophagus may be related to dysmotility or reflux.  Lungs/Pleura: Calcified granuloma identified posterior left lower lobe. No focal airspace consolidation. No pulmonary edema or pleural effusion.  Musculoskeletal: Patient is status post right shoulder replacement.  CT  ABDOMEN PELVIS FINDINGS  Hepatobiliary: Scattered well-defined low-density liver lesions of varying size cannot be definitively characterize but are likely cysts. No enhancing abnormality within the liver parenchyma. There is no evidence for gallstones, gallbladder wall thickening, or pericholecystic fluid. No intrahepatic or extrahepatic biliary dilation.  Pancreas: No focal mass lesion. No dilatation of the main duct. No intraparenchymal cyst. No peripancreatic edema.  Spleen: No splenomegaly. No focal mass lesion.  Adrenals/Urinary Tract: No adrenal nodule or mass. Two stones are seen in the right kidney measuring 3 mm in the interpolar region and 8 mm in the lower pole. Multiple scattered stones are seen in the left kidney ranging in size from 2 mm up to 8 mm. No hydronephrosis in either kidney. No hydroureter. The urinary bladder appears normal for the degree of distention.  Stomach/Bowel: Stomach is nondistended. No gastric wall thickening. No evidence of outlet obstruction. Duodenum is normally positioned as is the ligament of Treitz. No small bowel wall thickening. No small bowel dilatation. The terminal ileum is normal. The appendix is not visualized, but there is no edema or inflammation in the region of the cecum. Diverticular changes are noted in the left colon without evidence of diverticulitis.  Vascular/Lymphatic: There is abdominal aortic atherosclerosis without aneurysm. Right common iliac artery measures 1.7 cm diameter. Small penetrating ulcer or ulcerated plaque noted posterior wall aortic bifurcation. There is no gastrohepatic or hepatoduodenal ligament lymphadenopathy. No intraperitoneal or retroperitoneal lymphadenopathy. No pelvic sidewall lymphadenopathy.  Reproductive: Prostate gland is enlarged.  Other: No intraperitoneal free fluid.  Musculoskeletal: Bone windows reveal no worrisome lytic or sclerotic osseous lesions.  IMPRESSION: 1.  No acute findings in the chest, abdomen, or pelvis. 2. Abdominal aortic atherosclerosis. Small penetrating ulcer or ulcerated plaque posterior wall aortic bifurcation. Follow-up CTA of the abdomen and pelvis in 1 year is recommended to assess stability. 3. Bilateral nonobstructing renal stones. 4. Prostatomegaly.   Electronically Signed   By: EMisty StanleyM.D.   On: 06/14/2016 15:35  Assessment & Plan:   1. Sterile pyuria - Urinalysis, Complete  2. Bilateral non obstructing renal stones Will obtain a RUS at this time - will contact patient with results  3. Microscopic hematuria Patient has not had a formal hematuria workup with CTU and cystoscopy - UA may be representative of his bilateral stones Contrast CT in 2018 did not note any renal malignancies - RUS is pending Will need to consider cystoscopy in the future   Return for I will call patient with results.  These notes  generated with voice recognition software. I apologize for typographical errors.  Zara Council, PA-C  [facility] [address]  [place], [place] [address] [phone]

## 2017-09-12 ENCOUNTER — Ambulatory Visit: Payer: Medicare HMO | Admitting: Urology

## 2017-09-12 ENCOUNTER — Encounter: Payer: Self-pay | Admitting: Urology

## 2017-09-12 VITALS — BP 100/63 | HR 64 | Resp 16 | Ht 69.0 in | Wt 144.2 lb

## 2017-09-12 DIAGNOSIS — N200 Calculus of kidney: Secondary | ICD-10-CM | POA: Diagnosis not present

## 2017-09-12 DIAGNOSIS — R3129 Other microscopic hematuria: Secondary | ICD-10-CM

## 2017-09-12 DIAGNOSIS — R8281 Pyuria: Secondary | ICD-10-CM

## 2017-09-12 DIAGNOSIS — N390 Urinary tract infection, site not specified: Secondary | ICD-10-CM

## 2017-09-12 LAB — URINALYSIS, COMPLETE
Bilirubin, UA: NEGATIVE
Glucose, UA: NEGATIVE
Ketones, UA: NEGATIVE
NITRITE UA: POSITIVE — AB
Specific Gravity, UA: 1.015 (ref 1.005–1.030)
UUROB: 0.2 mg/dL (ref 0.2–1.0)
pH, UA: 6 (ref 5.0–7.5)

## 2017-09-12 LAB — MICROSCOPIC EXAMINATION

## 2017-09-12 LAB — BLADDER SCAN AMB NON-IMAGING: Scan Result: 0

## 2017-09-14 LAB — CULTURE, URINE COMPREHENSIVE

## 2017-09-15 ENCOUNTER — Telehealth: Payer: Self-pay

## 2017-09-15 MED ORDER — NITROFURANTOIN MONOHYD MACRO 100 MG PO CAPS: 100.0000 mg | ORAL_CAPSULE | Freq: Two times a day (BID) | ORAL | 0 refills | Status: AC

## 2017-09-15 NOTE — Telephone Encounter (Signed)
-----   Message from Nori Riis, PA-C sent at 09/14/2017  4:44 PM EDT ----- Please let Mr. Kozloski know that his urine culture is positive and he needs to start nitrofurantoin 100 mg twice daily for seven days.

## 2017-09-15 NOTE — Telephone Encounter (Signed)
Pt informed, rx sent to pharmacy.

## 2017-09-18 ENCOUNTER — Ambulatory Visit
Admission: RE | Admit: 2017-09-18 | Discharge: 2017-09-18 | Disposition: A | Discharge status: Home / Self Care | Payer: Medicare HMO | Source: Ambulatory Visit | Attending: Urology | Admitting: Urology

## 2017-09-18 DIAGNOSIS — R3129 Other microscopic hematuria: Secondary | ICD-10-CM | POA: Insufficient documentation

## 2017-09-18 DIAGNOSIS — N281 Cyst of kidney, acquired: Secondary | ICD-10-CM | POA: Diagnosis not present

## 2017-09-18 DIAGNOSIS — N200 Calculus of kidney: Secondary | ICD-10-CM | POA: Diagnosis not present

## 2017-09-18 DIAGNOSIS — N400 Enlarged prostate without lower urinary tract symptoms: Secondary | ICD-10-CM | POA: Diagnosis not present

## 2017-09-19 ENCOUNTER — Telehealth: Payer: Self-pay

## 2017-09-19 NOTE — Telephone Encounter (Signed)
Left message for pt to call office

## 2017-09-19 NOTE — Telephone Encounter (Signed)
-----   Message from Nori Riis, PA-C sent at 09/19/2017  7:48 AM EDT ----- Please let Mr. Oehlert know that his kidney ultrasound noted bilateral stones.  We need to see him to recheck his urine to see if the infection has cleared.  He is also due for a CT of his aneurysm.

## 2017-09-20 NOTE — Telephone Encounter (Signed)
Pt informed

## 2017-09-24 ENCOUNTER — Encounter: Payer: Self-pay | Admitting: Urology

## 2017-09-24 NOTE — Progress Notes (Signed)
Certified letter sent 

## 2017-09-27 ENCOUNTER — Other Ambulatory Visit: Payer: Self-pay

## 2017-10-04 ENCOUNTER — Other Ambulatory Visit: Payer: Self-pay

## 2017-10-04 ENCOUNTER — Other Ambulatory Visit: Payer: Medicare HMO

## 2017-10-04 DIAGNOSIS — R3129 Other microscopic hematuria: Secondary | ICD-10-CM | POA: Diagnosis not present

## 2017-10-04 LAB — URINALYSIS, COMPLETE
BILIRUBIN UA: NEGATIVE
GLUCOSE, UA: NEGATIVE
Ketones, UA: NEGATIVE
Nitrite, UA: POSITIVE — AB
Specific Gravity, UA: 1.02 (ref 1.005–1.030)
Urobilinogen, Ur: 1 mg/dL (ref 0.2–1.0)
pH, UA: 7 (ref 5.0–7.5)

## 2017-10-04 LAB — MICROSCOPIC EXAMINATION: Epithelial Cells (non renal): NONE SEEN /hpf (ref 0–10)

## 2017-10-07 LAB — CULTURE, URINE COMPREHENSIVE

## 2017-10-09 ENCOUNTER — Telehealth: Payer: Self-pay

## 2017-10-09 DIAGNOSIS — B957 Other staphylococcus as the cause of diseases classified elsewhere: Secondary | ICD-10-CM

## 2017-10-09 DIAGNOSIS — A498 Other bacterial infections of unspecified site: Secondary | ICD-10-CM

## 2017-10-09 MED ORDER — SULFAMETHOXAZOLE-TRIMETHOPRIM 800-160 MG PO TABS: 1.0000 | ORAL_TABLET | Freq: Two times a day (BID) | ORAL | 0 refills | Status: DC

## 2017-10-09 NOTE — Telephone Encounter (Signed)
-----   Message from Nori Riis, PA-C sent at 10/08/2017  8:15 PM EDT ----- Please let David [name] know that his urine culture is positive.  We need to start Septra DS, twice daily for seven days.   He should have an office visit after he finishes his antibiotic to discuss possibly treated his stones as they may be contributing to his rUTI's.

## 2017-10-09 NOTE — Telephone Encounter (Signed)
Called pt informed him of the information below. Pt will call back to scheduled appt, rx sent.

## 2017-10-24 ENCOUNTER — Ambulatory Visit: Payer: Self-pay | Admitting: Urology

## 2017-10-27 ENCOUNTER — Telehealth: Payer: Self-pay | Admitting: Urology

## 2017-10-27 NOTE — Telephone Encounter (Signed)
Please call David [name] and have him reschedule his missed appointment for rUTI's.

## 2017-11-30 DIAGNOSIS — Z23 Encounter for immunization: Secondary | ICD-10-CM | POA: Diagnosis not present

## 2017-11-30 DIAGNOSIS — J300 Vasomotor rhinitis: Secondary | ICD-10-CM | POA: Diagnosis not present

## 2017-11-30 DIAGNOSIS — E538 Deficiency of other specified B group vitamins: Secondary | ICD-10-CM | POA: Diagnosis not present

## 2017-11-30 DIAGNOSIS — K120 Recurrent oral aphthae: Secondary | ICD-10-CM | POA: Diagnosis not present

## 2018-08-13 DIAGNOSIS — M6281 Muscle weakness (generalized): Secondary | ICD-10-CM | POA: Diagnosis not present

## 2018-08-13 DIAGNOSIS — R270 Ataxia, unspecified: Secondary | ICD-10-CM | POA: Diagnosis not present

## 2018-08-13 DIAGNOSIS — R233 Spontaneous ecchymoses: Secondary | ICD-10-CM | POA: Diagnosis not present

## 2018-08-13 DIAGNOSIS — J300 Vasomotor rhinitis: Secondary | ICD-10-CM | POA: Diagnosis not present

## 2018-08-13 DIAGNOSIS — R8281 Pyuria: Secondary | ICD-10-CM | POA: Diagnosis not present

## 2018-08-13 DIAGNOSIS — M2042 Other hammer toe(s) (acquired), left foot: Secondary | ICD-10-CM | POA: Diagnosis not present

## 2018-08-13 DIAGNOSIS — R600 Localized edema: Secondary | ICD-10-CM | POA: Diagnosis not present

## 2018-08-13 DIAGNOSIS — F5101 Primary insomnia: Secondary | ICD-10-CM | POA: Diagnosis not present

## 2018-08-13 DIAGNOSIS — M2041 Other hammer toe(s) (acquired), right foot: Secondary | ICD-10-CM | POA: Diagnosis not present

## 2018-08-20 DIAGNOSIS — D72819 Decreased white blood cell count, unspecified: Secondary | ICD-10-CM | POA: Diagnosis not present

## 2018-08-20 DIAGNOSIS — Z87891 Personal history of nicotine dependence: Secondary | ICD-10-CM | POA: Diagnosis not present

## 2018-08-20 DIAGNOSIS — R600 Localized edema: Secondary | ICD-10-CM | POA: Diagnosis not present

## 2018-08-20 DIAGNOSIS — M6281 Muscle weakness (generalized): Secondary | ICD-10-CM | POA: Diagnosis not present

## 2018-08-20 DIAGNOSIS — M6282 Rhabdomyolysis: Secondary | ICD-10-CM | POA: Diagnosis not present

## 2018-08-20 DIAGNOSIS — R748 Abnormal levels of other serum enzymes: Secondary | ICD-10-CM | POA: Diagnosis not present

## 2018-08-23 DIAGNOSIS — M6281 Muscle weakness (generalized): Secondary | ICD-10-CM | POA: Diagnosis not present

## 2018-08-23 DIAGNOSIS — R748 Abnormal levels of other serum enzymes: Secondary | ICD-10-CM | POA: Diagnosis not present

## 2018-08-28 ENCOUNTER — Other Ambulatory Visit (HOSPITAL_COMMUNITY): Payer: Self-pay | Admitting: Internal Medicine

## 2018-08-28 ENCOUNTER — Other Ambulatory Visit: Payer: Self-pay | Admitting: Internal Medicine

## 2018-08-28 DIAGNOSIS — M60859 Other myositis, unspecified thigh: Secondary | ICD-10-CM

## 2018-08-28 DIAGNOSIS — R29898 Other symptoms and signs involving the musculoskeletal system: Secondary | ICD-10-CM

## 2018-08-29 DIAGNOSIS — R51 Headache: Secondary | ICD-10-CM | POA: Diagnosis not present

## 2018-08-29 DIAGNOSIS — R634 Abnormal weight loss: Secondary | ICD-10-CM | POA: Diagnosis not present

## 2018-08-29 DIAGNOSIS — F418 Other specified anxiety disorders: Secondary | ICD-10-CM | POA: Diagnosis not present

## 2018-08-29 DIAGNOSIS — Z820 Family history of epilepsy and other diseases of the nervous system: Secondary | ICD-10-CM | POA: Diagnosis not present

## 2018-08-29 DIAGNOSIS — R413 Other amnesia: Secondary | ICD-10-CM | POA: Diagnosis not present

## 2018-08-29 DIAGNOSIS — M6281 Muscle weakness (generalized): Secondary | ICD-10-CM | POA: Diagnosis not present

## 2018-08-30 ENCOUNTER — Other Ambulatory Visit: Payer: Self-pay | Admitting: Neurology

## 2018-08-30 DIAGNOSIS — R634 Abnormal weight loss: Secondary | ICD-10-CM

## 2018-09-03 DIAGNOSIS — R600 Localized edema: Secondary | ICD-10-CM | POA: Diagnosis not present

## 2018-09-03 DIAGNOSIS — R809 Proteinuria, unspecified: Secondary | ICD-10-CM | POA: Diagnosis not present

## 2018-09-04 ENCOUNTER — Other Ambulatory Visit: Payer: Self-pay

## 2018-09-04 ENCOUNTER — Ambulatory Visit
Admission: RE | Admit: 2018-09-04 | Discharge: 2018-09-04 | Disposition: A | Discharge status: Home / Self Care | Payer: Medicare HMO | Source: Ambulatory Visit | Attending: Neurology | Admitting: Neurology

## 2018-09-04 DIAGNOSIS — M6281 Muscle weakness (generalized): Secondary | ICD-10-CM | POA: Insufficient documentation

## 2018-09-04 DIAGNOSIS — N400 Enlarged prostate without lower urinary tract symptoms: Secondary | ICD-10-CM | POA: Insufficient documentation

## 2018-09-04 DIAGNOSIS — R911 Solitary pulmonary nodule: Secondary | ICD-10-CM | POA: Insufficient documentation

## 2018-09-04 DIAGNOSIS — R634 Abnormal weight loss: Secondary | ICD-10-CM | POA: Insufficient documentation

## 2018-09-04 DIAGNOSIS — N200 Calculus of kidney: Secondary | ICD-10-CM | POA: Diagnosis not present

## 2018-09-04 DIAGNOSIS — Z820 Family history of epilepsy and other diseases of the nervous system: Secondary | ICD-10-CM | POA: Diagnosis not present

## 2018-09-04 MED ORDER — IOHEXOL 300 MG/ML  SOLN
150.0000 mL | Freq: Once | INTRAMUSCULAR | Status: AC | PRN
  Administered 2018-09-04: 11:00:00 100 mL via INTRAVENOUS

## 2018-09-05 ENCOUNTER — Other Ambulatory Visit: Payer: Self-pay | Admitting: Nephrology

## 2018-09-05 DIAGNOSIS — R600 Localized edema: Secondary | ICD-10-CM

## 2018-09-07 DIAGNOSIS — M6281 Muscle weakness (generalized): Secondary | ICD-10-CM | POA: Diagnosis not present

## 2018-09-07 DIAGNOSIS — D72819 Decreased white blood cell count, unspecified: Secondary | ICD-10-CM | POA: Diagnosis not present

## 2018-09-07 DIAGNOSIS — K5909 Other constipation: Secondary | ICD-10-CM | POA: Diagnosis not present

## 2018-09-07 DIAGNOSIS — Z0000 Encounter for general adult medical examination without abnormal findings: Secondary | ICD-10-CM | POA: Diagnosis not present

## 2018-09-07 DIAGNOSIS — R748 Abnormal levels of other serum enzymes: Secondary | ICD-10-CM | POA: Diagnosis not present

## 2018-09-07 DIAGNOSIS — R600 Localized edema: Secondary | ICD-10-CM | POA: Diagnosis not present

## 2018-09-07 DIAGNOSIS — E7800 Pure hypercholesterolemia, unspecified: Secondary | ICD-10-CM | POA: Diagnosis not present

## 2018-09-07 DIAGNOSIS — E559 Vitamin D deficiency, unspecified: Secondary | ICD-10-CM | POA: Diagnosis not present

## 2018-09-07 DIAGNOSIS — J300 Vasomotor rhinitis: Secondary | ICD-10-CM | POA: Diagnosis not present

## 2018-09-09 ENCOUNTER — Ambulatory Visit: Payer: Medicare HMO

## 2018-09-11 ENCOUNTER — Other Ambulatory Visit: Payer: Self-pay

## 2018-09-11 ENCOUNTER — Ambulatory Visit
Admission: RE | Admit: 2018-09-11 | Discharge: 2018-09-11 | Disposition: A | Discharge status: Home / Self Care | Payer: Medicare HMO | Source: Ambulatory Visit | Attending: Nephrology | Admitting: Nephrology

## 2018-09-11 DIAGNOSIS — H2510 Age-related nuclear cataract, unspecified eye: Secondary | ICD-10-CM | POA: Diagnosis not present

## 2018-09-11 DIAGNOSIS — H353110 Nonexudative age-related macular degeneration, right eye, stage unspecified: Secondary | ICD-10-CM | POA: Diagnosis not present

## 2018-09-11 DIAGNOSIS — R600 Localized edema: Secondary | ICD-10-CM | POA: Insufficient documentation

## 2018-09-13 DIAGNOSIS — R634 Abnormal weight loss: Secondary | ICD-10-CM | POA: Diagnosis not present

## 2018-09-13 DIAGNOSIS — G5793 Unspecified mononeuropathy of bilateral lower limbs: Secondary | ICD-10-CM | POA: Diagnosis not present

## 2018-09-13 DIAGNOSIS — M6281 Muscle weakness (generalized): Secondary | ICD-10-CM | POA: Diagnosis not present

## 2018-09-13 DIAGNOSIS — R748 Abnormal levels of other serum enzymes: Secondary | ICD-10-CM | POA: Diagnosis not present

## 2018-09-15 ENCOUNTER — Other Ambulatory Visit: Payer: Self-pay

## 2018-09-15 ENCOUNTER — Ambulatory Visit
Admission: RE | Admit: 2018-09-15 | Discharge: 2018-09-15 | Disposition: A | Discharge status: Home / Self Care | Payer: Medicare HMO | Source: Ambulatory Visit | Attending: Internal Medicine | Admitting: Internal Medicine

## 2018-09-15 DIAGNOSIS — R29898 Other symptoms and signs involving the musculoskeletal system: Secondary | ICD-10-CM | POA: Insufficient documentation

## 2018-09-15 DIAGNOSIS — M60859 Other myositis, unspecified thigh: Secondary | ICD-10-CM | POA: Insufficient documentation

## 2018-09-15 DIAGNOSIS — M7062 Trochanteric bursitis, left hip: Secondary | ICD-10-CM | POA: Diagnosis not present

## 2018-09-15 DIAGNOSIS — M1712 Unilateral primary osteoarthritis, left knee: Secondary | ICD-10-CM | POA: Diagnosis not present

## 2018-09-17 DIAGNOSIS — L03032 Cellulitis of left toe: Secondary | ICD-10-CM | POA: Diagnosis not present

## 2018-09-24 DIAGNOSIS — R748 Abnormal levels of other serum enzymes: Secondary | ICD-10-CM | POA: Diagnosis not present

## 2018-09-24 DIAGNOSIS — M7989 Other specified soft tissue disorders: Secondary | ICD-10-CM | POA: Diagnosis not present

## 2018-09-24 DIAGNOSIS — M6281 Muscle weakness (generalized): Secondary | ICD-10-CM | POA: Diagnosis not present

## 2018-10-08 DIAGNOSIS — R809 Proteinuria, unspecified: Secondary | ICD-10-CM | POA: Diagnosis not present

## 2018-10-08 DIAGNOSIS — M2041 Other hammer toe(s) (acquired), right foot: Secondary | ICD-10-CM | POA: Diagnosis not present

## 2018-10-08 DIAGNOSIS — M2042 Other hammer toe(s) (acquired), left foot: Secondary | ICD-10-CM | POA: Diagnosis not present

## 2018-10-08 DIAGNOSIS — M6282 Rhabdomyolysis: Secondary | ICD-10-CM | POA: Diagnosis not present

## 2018-10-08 DIAGNOSIS — R600 Localized edema: Secondary | ICD-10-CM | POA: Diagnosis not present

## 2018-10-08 DIAGNOSIS — L03032 Cellulitis of left toe: Secondary | ICD-10-CM | POA: Diagnosis not present

## 2018-10-18 ENCOUNTER — Other Ambulatory Visit: Payer: Self-pay

## 2018-10-18 NOTE — Progress Notes (Signed)
[facility]  [address] [place], [place] [address] Phone: [phone]  Fax: [phone]   Clinic Day:  10/19/2018  Referring physician: Anthonette Legato, MD  Chief Complaint: David [name] is a 78 y.o. male with an abnormal SPEP who is referred in consultation by Dr Anthonette Legato for assessment and management.   HPI: The patient was referred to Dr Holley Raring for proteinuria and hematuria on 09/03/2018.  LDH was 487.  CK was elevated.  He is being worked up for myositis by rheumatology.  ANCA and ANA were negative.  Work-up on 09/03/2018 revealed: creatinine 1.10, calcium 9.3, protein 6.3, albumen 3.9, AST 58, ALT 43, C3 and C4 were normal.  CK was 623 (44-196).  ANA was negative.  Proteinase-3 antibody, myeloperoxidase antibody SPEP revealed an early nephrotic pattern.  Random UPEP revealed a faint band in the gamma globulins which may represent a monoclonal immunoglobulin or light chain.  Kappa free light chains 37.4, lambda free light chains 20.2, and ratio 1.85 (0.26-1.65).   CBC on 09/07/2018 showed a hematocrit 36.8, hemoglobin 12.1, MCV 97, platelets 246,000, WBC 3,700 with an ANC of 2100.   CK has ranged between 1138 - 3404.  Aldolase was 18.9 (3.3 - 10.3) on 08/13/2018.  TSH was 3.289 on 08/13/2018.  He states that a tick was found on his leg.  [facility] spotted fever and Lyme titers were negative.  He was placed on doxycycline.    He has a history of bipolar disorder, elevated PSA, and leukopenia.   WBC has ranged between 2800 - 3500 from 04/13/2017 - 08/20/2018.  The patient was previously seen by Dr. Yevette Edwards for chronic leukopenia on 06/02/2015 and 06/07/2016.    Symptomatically, noted swelling in his ankles and legs.  He has joint pain.  He notes his equilibrium is off when he starts walking.    He has bipolar disorder.  He notes when he was younger, he had mania then depression.  Depression "increased to full" over time.  He notes  depression for about 5 years.  When he is depressed, he does not want to eat.  Three months ago, his depression cleared.  He described weight loss from 170 to 130 pounds with depression;   weight has improved to 153 pounds.  He denies any issues with infections.  He notes that he bruises easily and is slow to heal, but this has improved.  Regarding his diet, he states that he "eats everything" for the past 3 months.  He eats meat 5 of 7 days a week.  He denies any pica.  He denies any restless legs.  He has a history of an elevated PSA.  He underwent biopsy at [facility] about 6 years ago.   Past Medical History:  Diagnosis Date   Allergic rhinitis    Anemia    Bipolar disorder (HCC)    Elevated PSA    had biopsy at [facility] around 2009-2010 negative for malignancy   Erectile dysfunction    GERD (gastroesophageal reflux disease)    Leucopenia    Osteoarthritis    in hands   Prostatitis    Sigmoid polyp     Past Surgical History:  Procedure Laterality Date   COLONOSCOPY  10/20/12   HERNIA REPAIR     Left knee replacement     Right shoulder replacement      Family History  Problem Relation Age of Onset   CVA Mother    CAD Father  Prostate cancer Neg Hx    Kidney cancer Neg Hx    Bladder Cancer Neg Hx     Social History:  reports that he quit smoking about 43 years ago. His smoking use included cigarettes. He has never used smokeless tobacco. He reports that he does not drink alcohol or use drugs. He denies any alcohol or tobacco use. He denies any known exposure to radiation or toxins. He lives in [place]. The patient is alone today.  Allergies:  Allergies  Allergen Reactions   Quetiapine Anxiety    Restlessness, worsening anxiety  Restlessness, worsening anxiety     Current Medications: Current Outpatient Medications  Medication Sig Dispense Refill   acetaminophen (TYLENOL) 500 MG tablet Take 500 mg by mouth every 8 (eight) hours as needed for moderate  pain.      amoxicillin-clavulanate (AUGMENTIN) 875-125 MG tablet Take 1 tablet every 12 (twelve) hours by mouth. 14 tablet 0   meloxicam (MOBIC) 7.5 MG tablet Take 7.5 mg by mouth daily.     multivitamin-lutein (OCUVITE-LUTEIN) CAPS capsule Take 1 capsule by mouth daily.     pseudoephedrine (SUDAFED) 30 MG/5ML syrup Take by mouth 4 (four) times daily as needed for congestion.     amphetamine-dextroamphetamine (ADDERALL) 5 MG tablet Please take one tablet two times per day: once in the morning when you wake up, and once in the mid-late afternoon.     azelastine (ASTELIN) 0.1 % nasal spray Place into the nose.     Blood Pressure Monitor DEVI      Cholecalciferol (D 1000) 1000 units capsule Take by mouth.     docusate sodium (COLACE) 100 MG capsule Take 1 capsule (100 mg total) by mouth daily. (Patient not taking: Reported on 10/19/2018) 15 capsule 0   FIRST AID KIT      lactulose (CHRONULAC) 10 GM/15ML solution TAKE 30 ML BY MOUTH NIGHTLY  2   Multiple Vitamin (MULTIVITAMIN) tablet Take 1 tablet by mouth daily.     nortriptyline (PAMELOR) 25 MG capsule Take 50 mg by mouth at bedtime.  0   omeprazole (PRILOSEC) 20 MG capsule      predniSONE (DELTASONE) 20 MG tablet Take 1 tablet (20 mg total) by mouth daily with breakfast. Once a day with food. (Patient not taking: Reported on 10/19/2018) 20 tablet 0   sulfamethoxazole-trimethoprim (BACTRIM DS,SEPTRA DS) 800-160 MG tablet Take 1 tablet by mouth every 12 (twelve) hours. (Patient not taking: Reported on 10/19/2018) 14 tablet 0   No current facility-administered medications for this visit.     Review of Systems  Constitutional: Positive for weight loss (improving). Negative for chills, diaphoresis, fever and malaise/fatigue.       Feels better in the past 3 months.  HENT: Negative.  Negative for congestion, ear pain, hearing loss, nosebleeds, sinus pain, sore throat and tinnitus.   Eyes: Negative.  Negative for blurred vision, double  vision and photophobia.  Respiratory: Negative.  Negative for cough, hemoptysis, shortness of breath and wheezing.   Cardiovascular: Positive for leg swelling (ankle and legs). Negative for chest pain, palpitations, orthopnea and PND.  Gastrointestinal: Negative.  Negative for abdominal pain, blood in stool, constipation, diarrhea, melena, nausea and vomiting.  Genitourinary: Negative.  Negative for dysuria, frequency, hematuria and urgency.  Musculoskeletal: Positive for joint pain. Negative for back pain and myalgias.  Skin: Negative.  Negative for rash.  Neurological: Negative for dizziness, tingling, sensory change, focal weakness (h/o left foot drop years ago, resolved), weakness and headaches (occasional).  Equilibrium off with initiation of gait.  Endo/Heme/Allergies: Bruises/bleeds easily.  Psychiatric/Behavioral: Positive for depression (improved). Negative for memory loss, substance abuse and suicidal ideas. The patient is not nervous/anxious.        Bipolar disorder.  All other systems reviewed and are negative.  Performance status (ECOG): 1-2  Vitals Blood pressure (!) 109/56, pulse 68, temperature 97.9 F (36.6 C), temperature source Tympanic, resp. rate 18, height '5\' 9"'$  (1.753 m), weight 154 lb 15.7 oz (70.3 kg), SpO2 98 %.   Physical Exam  Constitutional: He is oriented to person, place, and time. He appears well-developed and well-nourished. No distress.  HENT:  Head: Normocephalic and atraumatic.  Mouth/Throat: Oropharynx is clear and moist. No oropharyngeal exudate.  Short gray/white hair.  Mask.  Eyes: Pupils are equal, round, and reactive to light. Conjunctivae and EOM are normal. No scleral icterus.  Glasses.  Neck: Normal range of motion. Neck supple.  Cardiovascular: Normal rate, regular rhythm and normal heart sounds.  No murmur heard. Pulmonary/Chest: Effort normal and breath sounds normal. No respiratory distress. He has no wheezes. He has no rales.    Abdominal: Soft. Bowel sounds are normal. He exhibits no distension and no mass. There is no abdominal tenderness. There is no rebound and no guarding.  Musculoskeletal: Normal range of motion.        General: Tenderness (lower extremities) present. No edema.     Comments: Left knee scar.  Lymphadenopathy:    He has no cervical adenopathy.    He has no axillary adenopathy.       Right: No supraclavicular adenopathy present.       Left: No supraclavicular adenopathy present.  Neurological: He is alert and oriented to person, place, and time.  Skin: Skin is warm and dry. He is not diaphoretic. No erythema.  Psychiatric: He has a normal mood and affect. His behavior is normal. Judgment and thought content normal.  Nursing note and vitals reviewed.   No visits with results within 3 Day(s) from this visit.  Latest known visit with results is:  Lab on 10/04/2017  Component Date Value Ref Range Status   Specific Gravity, UA 10/04/2017 1.020  1.005 - 1.030 Final   pH, UA 10/04/2017 7.0  5.0 - 7.5 Final   Color, UA 10/04/2017 Yellow  Yellow Final   Appearance Ur 10/04/2017 Clear  Clear Final   Leukocytes, UA 10/04/2017 1+* Negative Final   Protein, UA 10/04/2017 Trace* Negative/Trace Final   Glucose, UA 10/04/2017 Negative  Negative Final   Ketones, UA 10/04/2017 Negative  Negative Final   RBC, UA 10/04/2017 2+* Negative Final   Bilirubin, UA 10/04/2017 Negative  Negative Final   Urobilinogen, Ur 10/04/2017 1.0  0.2 - 1.0 mg/dL Final   Nitrite, UA 10/04/2017 Positive* Negative Final   Microscopic Examination 10/04/2017 See below:   Final   WBC, UA 10/04/2017 11-30* 0 - 5 /hpf Final   RBC, UA 10/04/2017 11-30* 0 - 2 /hpf Final   Epithelial Cells (non renal) 10/04/2017 None seen  0 - 10 /hpf Final   Crystals 10/04/2017 Present* N/A Final   Crystal Type 10/04/2017 Calcium Oxalate  N/A Final   Mucus, UA 10/04/2017 Present* Not Estab. Final   Bacteria, UA 10/04/2017  Many* None seen/Few Final   Urine Culture, Comprehensive 10/04/2017 Final report*  Final   Organism ID, Bacteria 10/04/2017 Comment*  Final   Comment: Staphylococcus epidermidis Greater than 100,000 colony forming units per mL Based on resistance to oxacillin this isolate would  be resistant to all currently available beta-lactam antimicrobial agents, with the exception of the newer cephalosporins with anti-MRSA activity, such as Ceftaroline    ANTIMICROBIAL SUSCEPTIBILITY 10/04/2017 Comment   Final   Comment:       ** S = Susceptible; I = Intermediate; R = Resistant **                    P = Positive; N = Negative             MICS are expressed in micrograms per mL    Antibiotic                 RSLT#1    RSLT#2    RSLT#3    RSLT#4 Ciprofloxacin                  S Gentamicin                     S Levofloxacin                   S Linezolid                      S Nitrofurantoin                 S Oxacillin                      R Penicillin                     R Quinupristin/Dalfopristin      S Rifampin                       S Tetracycline                   S Trimethoprim/Sulfa             S Vancomycin                     S     Assessment:  LIONAL ICENOGLE is a 78 y.o. male with an abnormal SPEP and leukopenia.  He has hematuria and proteinuria.  Work-up on 09/03/2018 revealed: creatinine 1.10, calcium 9.3, protein 6.3, albumen 3.9, AST 58, ALT 43, C3 and C4 were normal.  CK was 623 (44-196).  ANA was negative.  Proteinase-3 antibody, myeloperoxidase antibody SPEP revealed an early nephrotic pattern.  Random UPEP revealed a faint band in the gamma globulins which may represent a monoclonal immunoglobulin or light chain.  Kappa free light chains 37.4, lambda free light chains 20.2, and ratio 1.85 (0.26-1.65).   CBC on 09/07/2018 showed a hematocrit 36.8, hemoglobin 12.1, MCV 97, platelets 246,000, WBC 3,700 with an ANC of 2100.   CK has ranged between 1138 - 3404.  Aldolase was 18.9 (3.3  - 10.3) on 08/13/2018.  TSH was 3.289 on 08/13/2018.  He has a history of chronic leukopenia.  WBC has ranged between 2800 - 3500 from 04/13/2017 - 08/20/2018.  He denies any issues with infections.   He has bipolar disorder.  Recently, he has mostly manifested depression.  When depressed, he eats very little.  He has not been depressed for the past 3 months.  Weight decreased from 170 to 130 pounds with depression;  weight has improved to 153 pounds.   Symptomatically, he is feeling better.  He has joint pain.  Exam reveals no adenopathy or hepatosplenomegaly.  Plan: 1. Labs today:  CBC with diff, CMP, myeloma panel, FLCA, B12, ferritin, iron studies, folate, copper level, HIV testing (consented), hepatitis B core antibody total, hepatitis C antibody, and flow cytometry. 2. 24 hour urine for UPEP and free light chains. 3. Abnormal SPEP  Significance is unclear.  Discuss differential diagnosis: monoclonal gammopathy of unknown significance (MGUS) versus lymphoproliferative disorder (myeloma, lymphoma).  Discuss work-up. 4. Leukopenia  Etiology is unclear but may be related to diet.  Discuss work-up 5. Normocytic anemia  Patient denies any melena or hematochezia.  Etiology may be secondary to dietary issues.  Discuss work-up. 6. RTC in 2 weeks for MD assessment, review of work-up, and discussion regarding direction of therapy.   I discussed the assessment and treatment plan with the patient.  The patient was provided an opportunity to ask questions and all were answered.  The patient agreed with the plan and demonstrated an understanding of the instructions.  The patient was advised to call back if the symptoms worsen or if the condition fails to improve as anticipated.   Merikay Lesniewski C. Mike Gip, MD, PhD    10/19/2018, 9:01 AM

## 2018-10-19 ENCOUNTER — Inpatient Hospital Stay: Payer: Medicare HMO

## 2018-10-19 ENCOUNTER — Inpatient Hospital Stay: Payer: Medicare HMO | Attending: Hematology and Oncology | Admitting: Hematology and Oncology

## 2018-10-19 ENCOUNTER — Encounter: Payer: Self-pay | Admitting: Hematology and Oncology

## 2018-10-19 VITALS — BP 109/56 | HR 68 | Temp 97.9°F | Resp 18 | Ht 69.0 in | Wt 155.0 lb

## 2018-10-19 DIAGNOSIS — R809 Proteinuria, unspecified: Secondary | ICD-10-CM

## 2018-10-19 DIAGNOSIS — M2550 Pain in unspecified joint: Secondary | ICD-10-CM | POA: Diagnosis not present

## 2018-10-19 DIAGNOSIS — R634 Abnormal weight loss: Secondary | ICD-10-CM

## 2018-10-19 DIAGNOSIS — D72819 Decreased white blood cell count, unspecified: Secondary | ICD-10-CM | POA: Diagnosis not present

## 2018-10-19 DIAGNOSIS — R319 Hematuria, unspecified: Secondary | ICD-10-CM | POA: Diagnosis not present

## 2018-10-19 DIAGNOSIS — Z87891 Personal history of nicotine dependence: Secondary | ICD-10-CM | POA: Diagnosis not present

## 2018-10-19 DIAGNOSIS — Z791 Long term (current) use of non-steroidal anti-inflammatories (NSAID): Secondary | ICD-10-CM | POA: Diagnosis not present

## 2018-10-19 DIAGNOSIS — L570 Actinic keratosis: Secondary | ICD-10-CM | POA: Diagnosis not present

## 2018-10-19 DIAGNOSIS — K219 Gastro-esophageal reflux disease without esophagitis: Secondary | ICD-10-CM | POA: Diagnosis not present

## 2018-10-19 DIAGNOSIS — Z79899 Other long term (current) drug therapy: Secondary | ICD-10-CM | POA: Diagnosis not present

## 2018-10-19 DIAGNOSIS — F319 Bipolar disorder, unspecified: Secondary | ICD-10-CM | POA: Diagnosis not present

## 2018-10-19 DIAGNOSIS — R778 Other specified abnormalities of plasma proteins: Secondary | ICD-10-CM | POA: Diagnosis not present

## 2018-10-19 DIAGNOSIS — D649 Anemia, unspecified: Secondary | ICD-10-CM | POA: Diagnosis not present

## 2018-10-19 LAB — CBC WITH DIFFERENTIAL/PLATELET
Abs Immature Granulocytes: 0.01 10*3/uL (ref 0.00–0.07)
Basophils Absolute: 0 10*3/uL (ref 0.0–0.1)
Basophils Relative: 1 %
Eosinophils Absolute: 0.2 10*3/uL (ref 0.0–0.5)
Eosinophils Relative: 5 %
HCT: 39.5 % (ref 39.0–52.0)
Hemoglobin: 13 g/dL (ref 13.0–17.0)
Immature Granulocytes: 0 %
Lymphocytes Relative: 36 %
Lymphs Abs: 1.1 10*3/uL (ref 0.7–4.0)
MCH: 32.6 pg (ref 26.0–34.0)
MCHC: 32.9 g/dL (ref 30.0–36.0)
MCV: 99 fL (ref 80.0–100.0)
Monocytes Absolute: 0.3 10*3/uL (ref 0.1–1.0)
Monocytes Relative: 9 %
Neutro Abs: 1.5 10*3/uL — ABNORMAL LOW (ref 1.7–7.7)
Neutrophils Relative %: 49 %
Platelets: 218 10*3/uL (ref 150–400)
RBC: 3.99 MIL/uL — ABNORMAL LOW (ref 4.22–5.81)
RDW: 14.2 % (ref 11.5–15.5)
WBC: 3.1 10*3/uL — ABNORMAL LOW (ref 4.0–10.5)
nRBC: 0 % (ref 0.0–0.2)

## 2018-10-19 LAB — COMPREHENSIVE METABOLIC PANEL
ALT: 33 U/L (ref 0–44)
AST: 43 U/L — ABNORMAL HIGH (ref 15–41)
Albumin: 3.7 g/dL (ref 3.5–5.0)
Alkaline Phosphatase: 95 U/L (ref 38–126)
Anion gap: 8 (ref 5–15)
BUN: 20 mg/dL (ref 8–23)
CO2: 23 mmol/L (ref 22–32)
Calcium: 9.1 mg/dL (ref 8.9–10.3)
Chloride: 108 mmol/L (ref 98–111)
Creatinine, Ser: 1.08 mg/dL (ref 0.61–1.24)
GFR calc Af Amer: >60 mL/min (ref >60)
GFR calc non Af Amer: >60 mL/min (ref >60)
Glucose, Bld: 91 mg/dL (ref 70–99)
Potassium: 4 mmol/L (ref 3.5–5.1)
Sodium: 139 mmol/L (ref 135–145)
Total Bilirubin: 0.7 mg/dL (ref 0.3–1.2)
Total Protein: 6.7 g/dL (ref 6.5–8.1)

## 2018-10-19 LAB — RETICULOCYTES
Immature Retic Fract: 11.3 % (ref 2.3–15.9)
RBC.: 4.03 MIL/uL — ABNORMAL LOW (ref 4.22–5.81)
Retic Count, Absolute: 75 10*3/uL (ref 19.0–186.0)
Retic Ct Pct: 1.9 % (ref 0.4–3.1)

## 2018-10-19 LAB — IRON AND TIBC
Iron: 62 ug/dL (ref 45–182)
Saturation Ratios: 19 % (ref 17.9–39.5)
TIBC: 325 ug/dL (ref 250–450)
UIBC: 263 ug/dL

## 2018-10-19 LAB — VITAMIN B12: Vitamin B-12: 288 pg/mL (ref 180–914)

## 2018-10-19 LAB — FOLATE: Folate: 35 ng/mL (ref >5.9)

## 2018-10-19 LAB — FERRITIN: Ferritin: 47 ng/mL (ref 24–336)

## 2018-10-19 NOTE — Progress Notes (Signed)
[place]

## 2018-10-20 LAB — HIV ANTIBODY (ROUTINE TESTING W REFLEX): HIV Screen 4th Generation wRfx: NONREACTIVE

## 2018-10-20 LAB — HEPATITIS B CORE ANTIBODY, TOTAL: Hep B Core Total Ab: NEGATIVE

## 2018-10-20 LAB — HEPATITIS C ANTIBODY: HCV Ab: <0.1 s/co ratio (ref 0.0–0.9)

## 2018-10-21 ENCOUNTER — Telehealth: Payer: Self-pay

## 2018-10-21 DIAGNOSIS — E538 Deficiency of other specified B group vitamins: Secondary | ICD-10-CM

## 2018-10-21 NOTE — Telephone Encounter (Signed)
Informed patient of low B12 level. Advised patient, per Dr. Mike Gip, needs to start Vitamin B12 1000 MCG Daily and recheck B12 level in 1 month. Patient verbalizes understanding and denies any further questions or concerns.

## 2018-10-21 NOTE — Telephone Encounter (Signed)
-----   Message from Lequita Asal, MD sent at 10/21/2018  3:07 PM EDT ----- Regarding: Please call patient  B12 was low at 288.   Begin oral B12 1000 mcg a day.  Repeat level in 1 month.  M ----- Message ----- From: Buel Ream, Lab In [place] Sent: 10/19/2018  10:03 AM EDT To: Lequita Asal, MD

## 2018-10-22 ENCOUNTER — Other Ambulatory Visit: Payer: Self-pay

## 2018-10-22 DIAGNOSIS — R634 Abnormal weight loss: Secondary | ICD-10-CM | POA: Diagnosis not present

## 2018-10-22 DIAGNOSIS — D649 Anemia, unspecified: Secondary | ICD-10-CM | POA: Diagnosis not present

## 2018-10-22 DIAGNOSIS — D72819 Decreased white blood cell count, unspecified: Secondary | ICD-10-CM | POA: Diagnosis not present

## 2018-10-22 DIAGNOSIS — K219 Gastro-esophageal reflux disease without esophagitis: Secondary | ICD-10-CM | POA: Diagnosis not present

## 2018-10-22 DIAGNOSIS — R809 Proteinuria, unspecified: Secondary | ICD-10-CM | POA: Diagnosis not present

## 2018-10-22 DIAGNOSIS — M2550 Pain in unspecified joint: Secondary | ICD-10-CM | POA: Diagnosis not present

## 2018-10-22 DIAGNOSIS — F319 Bipolar disorder, unspecified: Secondary | ICD-10-CM | POA: Diagnosis not present

## 2018-10-22 DIAGNOSIS — R778 Other specified abnormalities of plasma proteins: Secondary | ICD-10-CM

## 2018-10-22 DIAGNOSIS — R319 Hematuria, unspecified: Secondary | ICD-10-CM | POA: Diagnosis not present

## 2018-10-22 LAB — KAPPA/LAMBDA LIGHT CHAINS
Kappa free light chain: 28.1 mg/L — ABNORMAL HIGH (ref 3.3–19.4)
Kappa, lambda light chain ratio: 1.28 (ref 0.26–1.65)
Lambda free light chains: 22 mg/L (ref 5.7–26.3)

## 2018-10-22 LAB — MULTIPLE MYELOMA PANEL, SERUM
Albumin SerPl Elph-Mcnc: 3.4 g/dL (ref 2.9–4.4)
Albumin/Glob SerPl: 1.3 (ref 0.7–1.7)
Alpha 1: 0.3 g/dL (ref 0.0–0.4)
Alpha2 Glob SerPl Elph-Mcnc: 0.7 g/dL (ref 0.4–1.0)
B-Globulin SerPl Elph-Mcnc: 0.8 g/dL (ref 0.7–1.3)
Gamma Glob SerPl Elph-Mcnc: 0.9 g/dL (ref 0.4–1.8)
Globulin, Total: 2.7 g/dL (ref 2.2–3.9)
IgA: 184 mg/dL (ref 61–437)
IgG (Immunoglobin G), Serum: 931 mg/dL (ref 603–1613)
IgM (Immunoglobulin M), Srm: 35 mg/dL (ref 15–143)
Total Protein ELP: 6.1 g/dL (ref 6.0–8.5)

## 2018-10-23 LAB — COMP PANEL: LEUKEMIA/LYMPHOMA

## 2018-10-23 LAB — FREE K+L LT CHAINS,QN,UR
Free Kappa Lt Chains,Ur: 48.62 mg/L (ref 0.63–113.79)
Free Kappa/Lambda Ratio: 20.96 (ref 1.03–31.76)
Free Lambda Lt Chains,Ur: 2.32 mg/L (ref 0.47–11.77)
Total Volume: 1450

## 2018-10-23 LAB — COPPER, SERUM: Copper: 112 ug/dL (ref 72–166)

## 2018-10-24 LAB — UPEP/TP, 24-HR URINE
Albumin, U: 51.4 %
Alpha 1, Urine: 2.9 %
Alpha 2, Urine: 9.4 %
Beta, Urine: 18.5 %
Gamma Globulin, Urine: 17.9 %
Total Protein, Urine-Ur/day: 339 mg/24 hr — ABNORMAL HIGH (ref 30–150)
Total Protein, Urine: 23.4 mg/dL
Total Volume: 1450

## 2018-10-25 DIAGNOSIS — R748 Abnormal levels of other serum enzymes: Secondary | ICD-10-CM | POA: Diagnosis not present

## 2018-10-25 DIAGNOSIS — G5793 Unspecified mononeuropathy of bilateral lower limbs: Secondary | ICD-10-CM | POA: Diagnosis not present

## 2018-10-25 DIAGNOSIS — R2689 Other abnormalities of gait and mobility: Secondary | ICD-10-CM | POA: Diagnosis not present

## 2018-10-25 DIAGNOSIS — D8989 Other specified disorders involving the immune mechanism, not elsewhere classified: Secondary | ICD-10-CM | POA: Diagnosis not present

## 2018-10-25 DIAGNOSIS — R252 Cramp and spasm: Secondary | ICD-10-CM | POA: Diagnosis not present

## 2018-10-31 ENCOUNTER — Other Ambulatory Visit: Payer: Self-pay

## 2018-10-31 ENCOUNTER — Ambulatory Visit: Payer: Medicare HMO | Attending: Internal Medicine | Admitting: Physical Therapy

## 2018-10-31 ENCOUNTER — Encounter: Payer: Self-pay | Admitting: Physical Therapy

## 2018-10-31 DIAGNOSIS — Z9181 History of falling: Secondary | ICD-10-CM | POA: Diagnosis not present

## 2018-10-31 DIAGNOSIS — M79604 Pain in right leg: Secondary | ICD-10-CM | POA: Insufficient documentation

## 2018-10-31 DIAGNOSIS — M6281 Muscle weakness (generalized): Secondary | ICD-10-CM | POA: Insufficient documentation

## 2018-10-31 DIAGNOSIS — R2689 Other abnormalities of gait and mobility: Secondary | ICD-10-CM | POA: Diagnosis not present

## 2018-10-31 DIAGNOSIS — M79605 Pain in left leg: Secondary | ICD-10-CM

## 2018-10-31 DIAGNOSIS — G609 Hereditary and idiopathic neuropathy, unspecified: Secondary | ICD-10-CM | POA: Diagnosis not present

## 2018-10-31 NOTE — Patient Instructions (Signed)
Access Code: [email or web address]  URL: [email or web address]  Date: 10/31/2018  Prepared by: Dorcas Carrow   Exercises  Single Leg Stance with Support - 2 reps - 1 sets - 30 hold - 1x daily - 7x weekly  Standing March with Counter Support - 20 reps - 2 sets - 1x daily - 4x weekly  Heel rises with counter support - 20 reps - 2 sets - 1x daily - 4x weekly

## 2018-10-31 NOTE — Therapy (Signed)
[facility] [address] [place], [place], [address] Phone: [phone]   Fax:  [phone]  Physical Therapy Evaluation  Patient Details  Name: David [name] MRN: [id number] Date of Birth: 1940/02/22 Referring Provider (PT): Annalee Genta, MD   Encounter Date: 10/31/2018  PT End of Session - 10/31/18 1549    Visit Number  1    Number of Visits  8    Date for PT Re-Evaluation  11/28/18    Authorization - Visit Number  1    Authorization - Number of Visits  10    PT Start Time  0730    PT Stop Time  0836    PT Time Calculation (min)  66 min    Equipment Utilized During Treatment  Gait belt    Activity Tolerance  Patient tolerated treatment well    Behavior During Therapy  [facility] for tasks assessed/performed       Past Medical History:  Diagnosis Date  . Allergic rhinitis   . Anemia   . Bipolar disorder ([place])   . Elevated PSA    had biopsy at [facility] around 2009-2010 negative for malignancy  . Erectile dysfunction   . GERD (gastroesophageal reflux disease)   . Leucopenia   . Osteoarthritis    in hands  . Prostatitis   . Sigmoid polyp     Past Surgical History:  Procedure Laterality Date  . COLONOSCOPY  10/20/12  . HERNIA REPAIR    . Left knee replacement    . Right shoulder replacement      There were no vitals filed for this visit.   Subjective Assessment - 10/31/18 1548    Subjective  Pt reports he had bad depression from 2015-2020 and that he spent majority of day in bed. Pt states almost 4 months ago he "prayed and struggled through not going back to bed" and that he's been getting a "little bit better." Pt reports during that time he had difficulty with eating. Pt reports he wears compression socks and that his swelling has improved. Pt reports pain in R foot. Pt reports good B UE strength but that B LEs get tired. Pt states he has "burning pain" in B LEs. Pt reports 0/10 pain today but in last week pain reached 10/10  in B LEs.  Pt states "equilibrium is off." Pt states goal is to gain strength in his legs. Pt reports "bad fall" two weeks ago and back in June. Pt reports he was walking outside and that fell backward and hit his head. Pt reports he was able to turn over on his stomach and that he crawled to a post and pulled himself up. Pt reports when he gets up too fast out of the chair he falls back down.    Limitations  Walking;House hold activities    Currently in Pain?  No/denies         [facility] PT Assessment - 10/31/18 0001      Assessment   Medical Diagnosis   Poor balance; neuropathic pain of both feet    Referring Provider (PT)  Annalee Genta, MD    Onset Date/Surgical Date  06/22/18      Precautions   Precautions  Fall      Balance Screen   Has the patient fallen in the past 6 months  Yes    How many times?  [place] residence  BP supine 125/72 Hr 68 BP sitting 115/64 Hr 66 BP standing 118/69 Hr 67   FOTO: 73 (age norm 42) Hamstring length: L -45 deg, R -32 deg  MMT: LE: Hip flexion L 4+/5, R 4/5 Knee extension 5/5 B Knee flexion 5/5 B DF 5/5 B PF 5/5 B Hip abduction 5/5 B Hip adduction: 5/5 B Hip extension  5/5 B   Special Tests: Heel raise test - R 20 decreased range, L 25 decreased range with WB  5xSTS - 13 seconds - multiple attempts for first rep, poor eccentric control Berg: 46/56   Therapeutic Exercise: Ambulation through clinic-pt with inconsistent BOS, increased adduction on L LE when L LE in swing phase, decreased B heel strike, and decreased arm swing on R compared to L. Pt also with R lateral lean during gait and L hip drop.Reviewed HEP (See HEP)     PT Education - 10/31/18 1548    Education Details  Pt educated on HEP (see HEP)    Person(s) Educated  Patient    Methods  Explanation;Demonstration    Comprehension  Returned demonstration;Verbalized understanding          PT Long Term Goals -  10/31/18 1541      PT LONG TERM GOAL #1   Title  Pt will perform 5xSTS in <12 seconds without requiring multiple attempts to iniate stand in order to demonstrate increase in LE power.    Baseline  Pt 5xSTS 13 seconds with mutliple attempts to initate first stand 10/31/2018    Time  4    Period  Weeks    Status  New    Target Date  11/28/18      PT LONG TERM GOAL #2   Title  Pt. will demonstrate a score of at least 52/56 on Berg to indicate decrease in fall risk.    Baseline  Pt Berg score is 46/56 10/31/2018    Time  4    Period  Weeks    Status  New    Target Date  11/28/18      PT LONG TERM GOAL #3   Title  Pt will score 76 on FOTO to indicate an increase in functional mobility    Baseline  PT FOTO 73 11/01/2018    Time  4    Period  Weeks    Status  New    Target Date  11/28/18      PT LONG TERM GOAL #4   Title  Pt will demonstrate 25 full ROM single leg heel raises on B LEs to indicate increased endurance of B plantar flexors for improved balance    Baseline  R 20x with decreased range, L 25 with decreased range with WB through UE 10/31/2018    Time  4    Period  Weeks    Status  New    Target Date  11/28/18      PT LONG TERM GOAL #5   Title  Pt will demonstrate WFL hamstring length for improved gait mechanics    Baseline  Hamstring length assesment: L -45 deg, R -32 deg. 10/31/2018    Time  4    Period  Weeks    Status  New    Target Date  11/28/18         Plan - 10/31/18 1604    Clinical Impression Statement  Pt is a pleasant 78 y/o male presenting to therapy with complaints of decreased balance, B LE strength, and B  LE pain. Pt FOTO score 11 (age norm 55), indicating slight decrease in functional mobility. Pt MMT revealed LE strength grossly 5/5 except for hip flexion: L 4+/5 and R 4/5. Pt also demonstrates poor B plantarflexor endurance with heel raise test, performing 20 heel raises on R and 25 on L but with significantly limited ROM. Pt 5xSTS was 13 second with pt  requiring multiple attempts to initiate first rep and demonstrating poor eccentric control with sitting. Pt Berg score 46/56 indicating pt is at risk for falls. Pt also with limited hamstring length B: L -45 deg, R -32 deg. Gait assessment revealed pt with inconsistent BOS, increased adduction on L LE when L LE in swing phase, decreased B heel strike, and decreased arm swing on R compared to L. Pt also with R lateral lean during gait and L hip drop. Pt will continue to benefit from further skilled therapy to increase LE strength and decrease fall risk.    Personal Factors and Comorbidities  Age    Examination-Activity Limitations  Locomotion Level    Examination-Participation Restrictions  Cleaning;Community Activity;Yard Work    [employer]  Evolving/Moderate complexity    Clinical Decision Making  Moderate    Rehab Potential  Good    PT Frequency  2x / week    PT Duration  4 weeks    PT Treatment/Interventions  ADLs/Self Care Home Management;Moist Heat;Cryotherapy;Electrical Stimulation;DME Instruction;Gait training;Stair training;Functional mobility training;Therapeutic activities;Therapeutic exercise;Balance training;Neuromuscular re-education;Patient/family education;Orthotic Fit/Training;Manual techniques;Compression bandaging;Energy conservation    PT Next Visit Plan  Progress balance exercises    Consulted and Agree with Plan of Care  Patient       Patient will benefit from skilled therapeutic intervention in order to improve the following deficits and impairments:  Abnormal gait, Improper body mechanics, Pain, Decreased coordination, Decreased mobility, Postural dysfunction, Decreased activity tolerance, Decreased endurance, Decreased range of motion, Decreased strength, Decreased balance, Difficulty walking, Impaired flexibility  Visit Diagnosis: Balance disorder  Muscle weakness (generalized)  History of falling  Pain in left leg  Pain in right  leg     Problem List Patient Active Problem List   Diagnosis Date Noted  . Abnormal SPEP 10/19/2018  . Normocytic anemia 10/19/2018  . Leukopenia 10/19/2018  . Lymphopenia 06/07/2016  . Abnormal weight loss 06/07/2016  . Community acquired pneumonia of left lower lobe of lung ([place])   . Pneumonia of left lower lobe due to Streptococcus pneumoniae ([employer])   . Acute respiratory failure ([place])   . Bipolar disorder with moderate depression ([place]) 03/01/2016  . Hypotension 02/29/2016  . IDA (iron deficiency anemia) 06/19/2014  . Leucopenia 06/19/2014   Pura Spice, PT, DPT # [id number] Ricard Dillon, SPT 11/01/2018, 7:39 AM  [facility] [address] [place], [place], [address] Phone: [phone]   Fax:  [phone]  Name: SHRAVAN GAUTREAU MRN: [id number] Date of Birth: 1940/04/29

## 2018-11-02 NOTE — Progress Notes (Signed)
Called patient no answer left message to call back so we can get him checked in for visit on 9/14.   Tried calling patient again [phone]. No answer left message

## 2018-11-05 ENCOUNTER — Encounter: Payer: Self-pay | Admitting: Physical Therapy

## 2018-11-05 ENCOUNTER — Other Ambulatory Visit: Payer: Self-pay

## 2018-11-05 ENCOUNTER — Ambulatory Visit: Payer: Medicare HMO | Admitting: Physical Therapy

## 2018-11-05 ENCOUNTER — Inpatient Hospital Stay: Payer: Medicare HMO | Attending: Hematology and Oncology | Admitting: Hematology and Oncology

## 2018-11-05 ENCOUNTER — Encounter: Payer: Self-pay | Admitting: Hematology and Oncology

## 2018-11-05 VITALS — BP 102/69 | Resp 18 | Ht 69.0 in | Wt 156.7 lb

## 2018-11-05 DIAGNOSIS — F319 Bipolar disorder, unspecified: Secondary | ICD-10-CM | POA: Insufficient documentation

## 2018-11-05 DIAGNOSIS — Z9181 History of falling: Secondary | ICD-10-CM | POA: Diagnosis not present

## 2018-11-05 DIAGNOSIS — Z87891 Personal history of nicotine dependence: Secondary | ICD-10-CM | POA: Insufficient documentation

## 2018-11-05 DIAGNOSIS — Z79899 Other long term (current) drug therapy: Secondary | ICD-10-CM | POA: Insufficient documentation

## 2018-11-05 DIAGNOSIS — R2689 Other abnormalities of gait and mobility: Secondary | ICD-10-CM

## 2018-11-05 DIAGNOSIS — K219 Gastro-esophageal reflux disease without esophagitis: Secondary | ICD-10-CM | POA: Diagnosis not present

## 2018-11-05 DIAGNOSIS — E538 Deficiency of other specified B group vitamins: Secondary | ICD-10-CM | POA: Diagnosis not present

## 2018-11-05 DIAGNOSIS — M79605 Pain in left leg: Secondary | ICD-10-CM | POA: Diagnosis not present

## 2018-11-05 DIAGNOSIS — Z791 Long term (current) use of non-steroidal anti-inflammatories (NSAID): Secondary | ICD-10-CM | POA: Insufficient documentation

## 2018-11-05 DIAGNOSIS — M79604 Pain in right leg: Secondary | ICD-10-CM | POA: Diagnosis not present

## 2018-11-05 DIAGNOSIS — G629 Polyneuropathy, unspecified: Secondary | ICD-10-CM | POA: Insufficient documentation

## 2018-11-05 DIAGNOSIS — R778 Other specified abnormalities of plasma proteins: Secondary | ICD-10-CM | POA: Diagnosis not present

## 2018-11-05 DIAGNOSIS — Z8249 Family history of ischemic heart disease and other diseases of the circulatory system: Secondary | ICD-10-CM | POA: Diagnosis not present

## 2018-11-05 DIAGNOSIS — M6281 Muscle weakness (generalized): Secondary | ICD-10-CM | POA: Diagnosis not present

## 2018-11-05 DIAGNOSIS — D72819 Decreased white blood cell count, unspecified: Secondary | ICD-10-CM | POA: Diagnosis not present

## 2018-11-05 DIAGNOSIS — M1990 Unspecified osteoarthritis, unspecified site: Secondary | ICD-10-CM | POA: Diagnosis not present

## 2018-11-05 NOTE — Therapy (Signed)
[facility] [address] [place], [place], [address] Phone: [phone]   Fax:  [phone]  Physical Therapy Treatment  Patient Details  Name: David [name] MRN: [id number] Date of Birth: May 27, 1940 Referring Provider (PT): Dr. Annalee Genta,   Encounter Date: 11/05/2018  PT End of Session - 11/05/18 0834    Visit Number  2    Number of Visits  8    Date for PT Re-Evaluation  11/28/18    Authorization - Visit Number  2    Authorization - Number of Visits  10    PT Start Time  0729    PT Stop Time  0826    PT Time Calculation (min)  57 min    Equipment Utilized During Treatment  Gait belt    Activity Tolerance  Patient tolerated treatment well    Behavior During Therapy  [facility] for tasks assessed/performed       Past Medical History:  Diagnosis Date  . Allergic rhinitis   . Anemia   . Bipolar disorder ([place])   . Elevated PSA    had biopsy at [facility] around 2009-2010 negative for malignancy  . Erectile dysfunction   . GERD (gastroesophageal reflux disease)   . Leucopenia   . Osteoarthritis    in hands  . Prostatitis   . Sigmoid polyp     Past Surgical History:  Procedure Laterality Date  . COLONOSCOPY  10/20/12  . HERNIA REPAIR    . Left knee replacement    . Right shoulder replacement      There were no vitals filed for this visit.  Subjective Assessment - 11/05/18 0812    Subjective  Pt reports performing HEP. Pt states 0/10 pain today. Pt reports he is going to the doctor later today.    Limitations  Walking;House hold activities    Currently in Pain?  No/denies        Therapeutic Exercise:  SciFit, L5, 10 min Hamstring stretching - 2x30 sec B LEs Sit<>stand 10x - cuing for slower, controlled movement Staggered sit<>stand - 1x15 B LEs. Cuing for control with eccentric component. Pt with greater difficulty on the R as stance leg. Heel raises- 25x. Cuing for technique. Single leg heel raise- 1x15 B Standing  marches with 5# ankle weights - 2x20 Cuing for technique (pt compensating with hip ERs.) Gait training- 6x. Cuing for technique.  Neuro: Standing on foam, narrow stance, horizontal and vertical head turns - 2x30 sec each Standing on foam, narrow stance, EC - 2x30 sec .  Standing on foam, tandem - 2x30 sec. Intermittent UE support.      PT Education - 11/05/18 [phone]    Education Details  Pt educated on new HEP: seated and supine hamstring stretching, sit<>stands    Person(s) Educated  Patient    Methods  Explanation;Demonstration;Handout    Comprehension  Verbalized understanding;Returned demonstration          PT Long Term Goals - 10/31/18 1541      PT LONG TERM GOAL #1   Title  Pt will perform 5xSTS in <12 seconds without requiring multiple attempts to iniate stand in order to demonstrate increase in LE power.    Baseline  Pt 5xSTS 13 seconds with mutliple attempts to initate first stand 10/31/2018    Time  4    Period  Weeks    Status  New    Target Date  11/28/18      PT LONG TERM GOAL #  2   Title  Pt. will demonstrate a score of at least 52/56 on Berg to indicate decrease in fall risk.    Baseline  Pt Berg score is 46/56 10/31/2018    Time  4    Period  Weeks    Status  New    Target Date  11/28/18      PT LONG TERM GOAL #3   Title  Pt will score 76 on FOTO to indicate an increase in functional mobility    Baseline  PT FOTO 73 11/01/2018    Time  4    Period  Weeks    Status  New    Target Date  11/28/18      PT LONG TERM GOAL #4   Title  Pt will demonstrate 25 full ROM single leg heel raises on B LEs to indicate increased endurance of B plantar flexors for improved balance    Baseline  R 20x with decreased range, L 25 with decreased range with WB through UE 10/31/2018    Time  4    Period  Weeks    Status  New    Target Date  11/28/18      PT LONG TERM GOAL #5   Title  Pt will demonstrate WFL hamstring length for improved gait mechanics    Baseline  Hamstring  length assesment: L -45 deg, R -32 deg. 10/31/2018    Time  4    Period  Weeks    Status  New    Target Date  11/28/18            Plan - 11/05/18 [id number]    Clinical Impression Statement  Pt with difficulty performing staggered sit<>stands when R is stance leg and attempts to compensate by brining B legs back, indicating decreased R LE strength. Pt has decreased PF ROM with single leg heel raises compared to B LE heel raises and with more weightbearing through UEs, indicating decreased strength and endurance of B PFs. Pt requires cuing for technique with standing marches to avoid compensating with hip ERs. Pt with decreased postural stability with tandem stance on foam, requiring intermittent UE support to maintain balance and CGA from SPT. Pt required less intermittent UE support with R LE as support leg during tandem stance on foam compared to L LE. Pt still demonstrates significant L lateral lean with gait, requiring cuing for technique. Pt will continue to benefit from further skilled therapy to increase B LE strength, balance, and gait mechanics.    Personal Factors and Comorbidities  Age    Examination-Activity Limitations  Locomotion Level    Examination-Participation Restrictions  Cleaning;Community Activity;Yard Work    [employer]  Evolving/Moderate complexity    Clinical Decision Making  Moderate    Rehab Potential  Good    PT Frequency  2x / week    PT Duration  4 weeks    PT Treatment/Interventions  ADLs/Self Care Home Management;Moist Heat;Cryotherapy;Electrical Stimulation;DME Instruction;Gait training;Stair training;Functional mobility training;Therapeutic activities;Therapeutic exercise;Balance training;Neuromuscular re-education;Patient/family education;Orthotic Fit/Training;Manual techniques;Compression bandaging;Energy conservation    PT Next Visit Plan  Progress balance exercises; progress LE strengthening    Consulted and Agree with Plan of Care   Patient       Patient will benefit from skilled therapeutic intervention in order to improve the following deficits and impairments:  Abnormal gait, Improper body mechanics, Pain, Decreased coordination, Decreased mobility, Postural dysfunction, Decreased activity tolerance, Decreased endurance, Decreased range of motion, Decreased strength, Decreased balance, Difficulty  walking, Impaired flexibility  Visit Diagnosis: Balance disorder  Muscle weakness (generalized)  History of falling  Pain in left leg     Problem List Patient Active Problem List   Diagnosis Date Noted  . B12 deficiency 11/05/2018  . Abnormal SPEP 10/19/2018  . Normocytic anemia 10/19/2018  . Leukopenia 10/19/2018  . Lymphopenia 06/07/2016  . Abnormal weight loss 06/07/2016  . Community acquired pneumonia of left lower lobe of lung ([place])   . Pneumonia of left lower lobe due to Streptococcus pneumoniae ([place])   . Acute respiratory failure ([place])   . Bipolar disorder with moderate depression ([place]) 03/01/2016  . Hypotension 02/29/2016  . IDA (iron deficiency anemia) 06/19/2014  . Leucopenia 06/19/2014   Pura Spice, PT, DPT # [id number] Ricard Dillon, SPT 11/05/2018, 8:34 AM  [facility] [address] [place], [place], [address] Phone: [phone]   Fax:  [phone]  Name: DEZION JEANLOUIS MRN: [id number] Date of Birth: 1940-03-24

## 2018-11-05 NOTE — Patient Instructions (Signed)
Access Code: [email or web address]  URL: [email or web address]  Date: 11/05/2018  Prepared by: Dorcas Carrow   Exercises  Seated Hamstring Stretch - 2 reps - 1 sets - 30 hold - 1x daily - 7x weekly  Supine Hamstring Stretch with Strap - 2 reps - 1 sets - 30 hold - 1x daily - 7x weekly  Sit to Stand with Arms Crossed - 20 reps - 2 sets - 1x daily - 4x weekly

## 2018-11-05 NOTE — Progress Notes (Signed)
[facility]  [address], [address] [place], [place] [address] Phone: [phone]  Fax: [phone]   Clinic Day:  11/05/2018  Referring physician: Ezequiel Kayser, MD  Chief Complaint: David [name] is a 78 y.o. male with an abnormal SPEP who is seen for review of work-up and discussion regarding direction of therapy.   HPI: The patient was last seen in the hematology clinic on 10/19/2018 for initial consultation.   He was noted to have an abnormal SPEP and leukopenia.  He described a period of depression which had recently cleared.  Symptomatically, he was feeling better.  He had joint pain.  Exam revealed no adenopathy or hepatosplenomegaly.  He underwent a work-up.  CBC included a hematocrit of 39.5, hemoglobin 13.0, platelets 218,000, white count 3100 with an ANC of 1500.  Differential included 49% segs, 36% lymphs, 9% monocytes, 5% eosinophils and 1% basophils.  Reticulocyte count was 1.9%.  Ferritin was 47 with an iron saturation of 19% with a TIBC of 325.  SPEP revealed no monoclonal protein.  Kappa free light chains were 28.1, lambda free light chains 22.0 with a ratio of 1.28 (normal).  24-hour UPEP with free light chains were negative.  Flow cytometry revealed no significant immunophenotypic abnormality.  HIV testing, hepatitis C antibody, and hepatitis B core antibody total were negative.  AST was 43 (15-41).  Copper level was 112.  Folate was 35.  B12 was 288 (low).  He was contacted on 10/21/2018 to begin B12 1000 mcg a day. We discussed the potential for needing B12 injections if his level did not improve (goal 400).   During the interim, he has been doing physical therapy.   He is taking gabapentin for nerve pain; if he forgets taking them, he has intense pain that wakes him up at night.  Dosage was recently increased.   I discussed the importance of social distancing with him amid the pandemic.    Past Medical History:  Diagnosis Date  . Allergic  rhinitis   . Anemia   . Bipolar disorder ([place])   . Elevated PSA    had biopsy at [facility] around 2009-2010 negative for malignancy  . Erectile dysfunction   . GERD (gastroesophageal reflux disease)   . Leucopenia   . Osteoarthritis    in hands  . Prostatitis   . Sigmoid polyp     Past Surgical History:  Procedure Laterality Date  . COLONOSCOPY  10/20/12  . HERNIA REPAIR    . Left knee replacement    . Right shoulder replacement      Family History  Problem Relation Age of Onset  . CVA Mother   . CAD Father   . Prostate cancer Neg Hx   . Kidney cancer Neg Hx   . Bladder Cancer Neg Hx     Social History:  reports that he quit smoking about 43 years ago. His smoking use included cigarettes. He has never used smokeless tobacco. He reports that he does not drink alcohol or use drugs. He denies any alcohol or tobacco use. He denies any known exposure to radiation or toxins. He lives in [place].  The patient is alone today.  Allergies:  Allergies  Allergen Reactions  . Quetiapine Anxiety    Restlessness, worsening anxiety  Restlessness, worsening anxiety     Current Medications: Current Outpatient Medications  Medication Sig Dispense Refill  . acetaminophen (TYLENOL) 500 MG tablet Take 500 mg by mouth every 8 (eight) hours as needed  for moderate pain.     . Cholecalciferol (D 1000) 1000 units capsule Take by mouth.    . lactulose (CHRONULAC) 10 GM/15ML solution TAKE 30 ML BY MOUTH NIGHTLY  2  . meloxicam (MOBIC) 7.5 MG tablet Take 7.5 mg by mouth daily.    . Multiple Vitamin (MULTIVITAMIN) tablet Take 1 tablet by mouth daily.    . multivitamin-lutein (OCUVITE-LUTEIN) CAPS capsule Take 1 capsule by mouth daily.    Marland Kitchen amoxicillin-clavulanate (AUGMENTIN) 875-125 MG tablet Take 1 tablet every 12 (twelve) hours by mouth. (Patient not taking: Reported on 11/05/2018) 14 tablet 0  . azelastine (ASTELIN) 0.1 % nasal spray Place into the nose.    . Blood Pressure Monitor DEVI     .  FIRST AID KIT     . predniSONE (DELTASONE) 20 MG tablet Take 1 tablet (20 mg total) by mouth daily with breakfast. Once a day with food. (Patient not taking: Reported on 10/19/2018) 20 tablet 0   No current facility-administered medications for this visit.     Review of Systems  Constitutional: Negative.  Negative for chills, diaphoresis, fever, malaise/fatigue and weight loss (up 2 lbs).       Feels better in the past 3 months.  HENT: Negative.  Negative for congestion, ear pain, hearing loss, nosebleeds, sinus pain, sore throat and tinnitus.   Eyes: Negative.  Negative for blurred vision, double vision and photophobia.  Respiratory: Negative.  Negative for cough, hemoptysis, shortness of breath and wheezing.   Cardiovascular: Positive for leg swelling (ankles). Negative for chest pain, palpitations, orthopnea and PND.  Gastrointestinal: Negative.  Negative for abdominal pain, blood in stool, constipation, diarrhea, melena, nausea and vomiting.  Genitourinary: Negative.  Negative for dysuria, frequency, hematuria and urgency.  Musculoskeletal: Positive for joint pain. Negative for back pain and myalgias.  Skin: Negative.  Negative for rash.  Neurological: Negative for dizziness, tingling, sensory change, focal weakness (h/o left foot drop years ago, resolved), weakness and headaches (occasional).       Equilibrium off with initiation of gait.  Endo/Heme/Allergies: Bruises/bleeds easily.  Psychiatric/Behavioral: Negative for depression, memory loss, substance abuse and suicidal ideas. The patient is not nervous/anxious.        Bipolar disorder.  All other systems reviewed and are negative.  Performance status (ECOG): 1  Vitals Blood pressure 102/69, resp. rate 18, height '5\' 9"'$  (1.753 m), weight 156 lb 12 oz (71.1 kg).   Physical Exam  Constitutional: He is oriented to person, place, and time. He appears well-developed and well-nourished. No distress.  HENT:  Head: Normocephalic and  atraumatic.  Short gray hair.  Male pattern baldness.  Mask.  Eyes: Conjunctivae and EOM are normal. No scleral icterus.  Glasses.  Blue eyes.  Musculoskeletal:        General: No edema.  Neurological: He is alert and oriented to person, place, and time.  Skin: No rash noted. He is not diaphoretic. No erythema. No pallor.  Psychiatric: He has a normal mood and affect. His behavior is normal. Judgment and thought content normal.  Nursing note and vitals reviewed.   No visits with results within 3 Day(s) from this visit.  Latest known visit with results is:  Orders Only on 10/22/2018  Component Date Value Ref Range Status  . Free Kappa Lt Chains,Ur 10/22/2018 48.62  0.63 - 113.79 mg/L Final  . Free Lambda Lt Chains,Ur 10/22/2018 2.32  0.47 - 11.77 mg/L Final  . Free Kappa/Lambda Ratio 10/22/2018 20.96  1.03 - 31.76 Final  Comment: (NOTE) Performed At: [facility] [address] [place], [place] [id number] Rush Farmer MD [id number]   . Total Volume 10/22/2018 1,450   Final   Performed at [facility], [address] [place], [place] [address]  . Total Protein, Urine 10/22/2018 23.4  Not Estab. mg/dL Final  . Total Protein, Urine-Ur/day 10/22/2018 339* 30 - 150 mg/24 hr Final  . Albumin, U 10/22/2018 51.4  % Final  . Alpha 1, Urine 10/22/2018 2.9  % Final  . Alpha 2, Urine 10/22/2018 9.4  % Final  . Beta, Urine 10/22/2018 18.5  % Final  . Gamma Globulin, Urine 10/22/2018 17.9  % Final  . M-spike, % 10/22/2018 Not Observed  Not Observed % Final  . Please Note: 10/22/2018 Comment   Final   Comment: (NOTE) Protein electrophoresis scan will follow via computer, mail, or courier delivery. Performed At: [facility] [place], [place] [id number] Rush Farmer MD [id number]   . Total Volume 10/22/2018 1,450   Final   Performed at [facility], [address] [place], [place] [address]    Assessment:  David [name]  is a 78 y.o. male with a history of an abnormal SPEP and chronic leukopenia.  He has a history of hematuria and proteinuria.  Work-up on 09/03/2018 revealed: creatinine 1.10, calcium 9.3, protein 6.3, albumen 3.9, AST 58, ALT 43, C3 and C4 were normal.  CK was 623 (44-196).  ANA was negative.  Proteinase-3 antibody, myeloperoxidase antibody SPEP revealed an early nephrotic pattern.  Random UPEP revealed a faint band in the gamma globulins which may represent a monoclonal immunoglobulin or light chain.  Kappa free light chains 37.4, lambda free light chains 20.2, and ratio 1.85 (0.26-1.65).   CBC on 09/07/2018 showed a hematocrit 36.8, hemoglobin 12.1, MCV 97, platelets 246,000, WBC 3,700 with an ANC of 2100.   CK has ranged between 1138 - 3404.  Aldolase was 18.9 (3.3 - 10.3) on 08/13/2018.  TSH was 3.289 on 08/13/2018.  Work-up on 10/19/2018 revealed a hematocrit of 39.5, hemoglobin 13.0, MCV 99, platelets 218,000, white count 3100 with an ANC of 1500.  Differential included 49% segs, 36% lymphs, 9% monocytes, 5% eosinophils and 1% basophils.  Normal studies included:  reticulocyte count, ferritin (47) , iron studies, SPEP, free light chain ratio, 24-hour UPEP with free light chains (total protein 339 mg/24 hrs), flow cytometry, HIV testing, hepatitis C antibody, hepatitis B core antibody total, copper level, and folate (35).  B12 was 288 (low).  He has a history of chronic leukopenia.  WBC has ranged between 2800 - 3500 from 04/13/2017 - 08/20/2018.  He denies any issues with infections.   He has bipolar disorder.  Recently, he has mostly manifested depression.  When depressed, he eats very little.  He has not been depressed for the past 3 months.  Weight decreased from 170 to 130 pounds with depression;  weight has improved to 153 pounds.   Symptomatically, he is feeling better.  He has a significant neuropathy.  Plan: 1.   Abnormal SPEP             Repeat testing revealed no monoclonal  gammopathy.  Free light chain ratio was normal.  24 hour UPEP with free light chains were normal. 2.   Leukopenia       WBC 3100.  ANC 1500.  Hepatitis testing, HIV testing, copper level was normal.  B12 level is low.  He began B12 1000 mcg  a day on 10/21/2018.  Discuss the plan for B12 level recheck in 1 month.   If level < 400, begin monthly B12 injections.   Assess for pernicious anemia. 3.   Normocytic anemia   Counts are normal.  Hematocrit 39.5.  Hemoglobin 13.0.  MCV 99.  Patient denies any bleeding.  Iron studies and folate are normal. 4.   RTC prn.  Patient would like follow-up and monitoring with Dr Raechel Ache.  I discussed the assessment and treatment plan with the patient.  The patient was provided an opportunity to ask questions and all were answered.  The patient agreed with the plan and demonstrated an understanding of the instructions.  The patient was advised to call back if the symptoms worsen or if the condition fails to improve as anticipated.    Lequita Asal, MD, PhD    11/05/2018, 3:35 PM  I, Jacqualyn Posey, am acting as Education administrator for [employer]. Mike Gip, MD, PhD.  I, Melissa C. Mike Gip, MD, have reviewed the above documentation for accuracy and completeness, and I agree with the above.

## 2018-11-05 NOTE — Progress Notes (Signed)
No new changes noted today 

## 2018-11-07 ENCOUNTER — Encounter: Payer: Self-pay | Admitting: Physical Therapy

## 2018-11-07 ENCOUNTER — Ambulatory Visit: Payer: Medicare HMO | Admitting: Physical Therapy

## 2018-11-07 ENCOUNTER — Other Ambulatory Visit: Payer: Self-pay

## 2018-11-07 DIAGNOSIS — M79605 Pain in left leg: Secondary | ICD-10-CM | POA: Diagnosis not present

## 2018-11-07 DIAGNOSIS — M79604 Pain in right leg: Secondary | ICD-10-CM

## 2018-11-07 DIAGNOSIS — M6281 Muscle weakness (generalized): Secondary | ICD-10-CM | POA: Diagnosis not present

## 2018-11-07 DIAGNOSIS — R2689 Other abnormalities of gait and mobility: Secondary | ICD-10-CM

## 2018-11-07 DIAGNOSIS — Z9181 History of falling: Secondary | ICD-10-CM

## 2018-11-07 NOTE — Therapy (Signed)
[facility] [address] [place], [place], [address] Phone: [phone]   Fax:  [phone]  Physical Therapy Treatment  Patient Details  Name: David [name] MRN: [id number] Date of Birth: 1941/01/04 Referring Provider (PT): Dr. Annalee Genta,   Encounter Date: 11/07/2018  PT End of Session - 11/07/18 0940    Visit Number  3    Number of Visits  8    Date for PT Re-Evaluation  11/28/18    Authorization - Visit Number  3    Authorization - Number of Visits  10    PT Start Time  0731    PT Stop Time  0819    PT Time Calculation (min)  48 min    Equipment Utilized During Treatment  Gait belt    Activity Tolerance  Patient tolerated treatment well    Behavior During Therapy  [facility] for tasks assessed/performed       Past Medical History:  Diagnosis Date  . Allergic rhinitis   . Anemia   . Bipolar disorder ([place])   . Elevated PSA    had biopsy at [facility] around 2009-2010 negative for malignancy  . Erectile dysfunction   . GERD (gastroesophageal reflux disease)   . Leucopenia   . Osteoarthritis    in hands  . Prostatitis   . Sigmoid polyp     Past Surgical History:  Procedure Laterality Date  . COLONOSCOPY  10/20/12  . HERNIA REPAIR    . Left knee replacement    . Right shoulder replacement      There were no vitals filed for this visit.  Subjective Assessment - 11/07/18 0736    Subjective  Pt reports performing HEP. Pt reports 0/10 pain today.    Limitations  Walking;House hold activities    Currently in Pain?  No/denies        Therapeutic Exercise:  SciFit, L 6.5, 10 min Hamstring stretching - 2x30 sec B LEs Heel raises- 25x. Cuing for technique. Single leg heel raise- 1x15 B Standing marches with 5# ankle weights - 2x20 Cuing for technique (pt compensating with hip ERs.) Gait training- 6x. Cuing for technique.   Neuro:  Standing on foam, narrow stance, EO/ EC - 2x30 sec each Standing on foam, tandem - 2x30  sec with intermittent UE support Standing on foam, single leg stance - 2x30 sec B with frequent UE support Obstacle course with side stepping through cones, airex balance beam - x multiple repetitions     PT Long Term Goals - 10/31/18 1541      PT LONG TERM GOAL #1   Title  Pt will perform 5xSTS in <12 seconds without requiring multiple attempts to iniate stand in order to demonstrate increase in LE power.    Baseline  Pt 5xSTS 13 seconds with mutliple attempts to initate first stand 10/31/2018    Time  4    Period  Weeks    Status  New    Target Date  11/28/18      PT LONG TERM GOAL #2   Title  Pt. will demonstrate a score of at least 52/56 on Berg to indicate decrease in fall risk.    Baseline  Pt Berg score is 46/56 10/31/2018    Time  4    Period  Weeks    Status  New    Target Date  11/28/18      PT LONG TERM GOAL #3   Title  Pt will score  76 on FOTO to indicate an increase in functional mobility    Baseline  PT FOTO 73 11/01/2018    Time  4    Period  Weeks    Status  New    Target Date  11/28/18      PT LONG TERM GOAL #4   Title  Pt will demonstrate 25 full ROM single leg heel raises on B LEs to indicate increased endurance of B plantar flexors for improved balance    Baseline  R 20x with decreased range, L 25 with decreased range with WB through UE 10/31/2018    Time  4    Period  Weeks    Status  New    Target Date  11/28/18      PT LONG TERM GOAL #5   Title  Pt will demonstrate WFL hamstring length for improved gait mechanics    Baseline  Hamstring length assesment: L -45 deg, R -32 deg. 10/31/2018    Time  4    Period  Weeks    Status  New    Target Date  11/28/18            Plan - 11/07/18 [id number]    Clinical Impression Statement  Pt had greater difficulty with R LE as primary WB LE during tandem stance on foam, listing laterally and requiring intermittent UE support to regain balance. Pt also used mid-guard to improve static balance and had decreased balance  with UEs at side. Pt relies more on mid-guard with increased intermittent UE support with SLB on foam and reports standing on L LE is more difficult than R. Pt demonstrated good stepping strategy during obstacle course with B LEs, but had difficulty navigating narrow lateral-stepping between cones, knocking over several cones. Pt will benefit from further skilled therapy to improve balance in order to decrease risk of falls.    Personal Factors and Comorbidities  Age    Examination-Activity Limitations  Locomotion Level    Examination-Participation Restrictions  Cleaning;Community Activity;Yard Work    [employer] officer  Evolving/Moderate complexity    Clinical Decision Making  Moderate    Rehab Potential  Good    PT Frequency  2x / week    PT Duration  4 weeks    PT Treatment/Interventions  ADLs/Self Care Home Management;Moist Heat;Cryotherapy;Electrical Stimulation;DME Instruction;Gait training;Stair training;Functional mobility training;Therapeutic activities;Therapeutic exercise;Balance training;Neuromuscular re-education;Patient/family education;Orthotic Fit/Training;Manual techniques;Compression bandaging;Energy conservation    PT Next Visit Plan  Progress balance exercises; progress LE strengthening    Consulted and Agree with Plan of Care  Patient       Patient will benefit from skilled therapeutic intervention in order to improve the following deficits and impairments:  Abnormal gait, Improper body mechanics, Pain, Decreased coordination, Decreased mobility, Postural dysfunction, Decreased activity tolerance, Decreased endurance, Decreased range of motion, Decreased strength, Decreased balance, Difficulty walking, Impaired flexibility  Visit Diagnosis: Balance disorder  Muscle weakness (generalized)  History of falling  Pain in right leg  Pain in left leg     Problem List Patient Active Problem List   Diagnosis Date Noted  . B12 deficiency 11/05/2018  .  Abnormal SPEP 10/19/2018  . Normocytic anemia 10/19/2018  . Leukopenia 10/19/2018  . Lymphopenia 06/07/2016  . Abnormal weight loss 06/07/2016  . Community acquired pneumonia of left lower lobe of lung ([place])   . Pneumonia of left lower lobe due to Streptococcus pneumoniae ([place])   . Acute respiratory failure ([place])   . Bipolar disorder with moderate depression (  [place]) 03/01/2016  . Hypotension 02/29/2016  . IDA (iron deficiency anemia) 06/19/2014  . Leucopenia 06/19/2014   Pura Spice, PT, DPT # [id number], SPT 11/07/2018, 9:42 AM  [facility] [address] [place], [place], [address] Phone: [phone]   Fax:  [phone]  Name: David [name] MRN: [id number] Date of Birth: 11-15-1940

## 2018-11-07 NOTE — Patient Instructions (Signed)
Access Code: [facility]  URL: [email or web address]  Date: 11/07/2018  Prepared by: Dorcas Carrow   Exercises  Sit to Stand without Arm Support - 20 reps - 2 sets - 1x daily - 4x weekly  Standing Hip Abduction with Counter Support - 20 reps - 2 sets - 1x daily - 4x weekly

## 2018-11-12 ENCOUNTER — Ambulatory Visit: Payer: Medicare HMO | Admitting: Physical Therapy

## 2018-11-12 ENCOUNTER — Encounter: Payer: Self-pay | Admitting: Physical Therapy

## 2018-11-12 ENCOUNTER — Other Ambulatory Visit: Payer: Self-pay

## 2018-11-12 DIAGNOSIS — M79605 Pain in left leg: Secondary | ICD-10-CM

## 2018-11-12 DIAGNOSIS — M6281 Muscle weakness (generalized): Secondary | ICD-10-CM | POA: Diagnosis not present

## 2018-11-12 DIAGNOSIS — R2689 Other abnormalities of gait and mobility: Secondary | ICD-10-CM | POA: Diagnosis not present

## 2018-11-12 DIAGNOSIS — Z9181 History of falling: Secondary | ICD-10-CM | POA: Diagnosis not present

## 2018-11-12 DIAGNOSIS — M79604 Pain in right leg: Secondary | ICD-10-CM | POA: Diagnosis not present

## 2018-11-12 NOTE — Therapy (Signed)
[place] [facility] [address] [place], [place], [address] Phone: [phone]   Fax:  [phone]  Physical Therapy Treatment  Patient Details  Name: David [name] MRN: [id number] Date of Birth: 05/02/1940 Referring Provider (PT): Dr. Annalee Genta,   Encounter Date: 11/12/2018  PT End of Session - 11/12/18 0853    Visit Number  4    Number of Visits  8    Date for PT Re-Evaluation  11/28/18    Authorization - Visit Number  4    Authorization - Number of Visits  10    PT Start Time  0728    PT Stop Time  0827    PT Time Calculation (min)  59 min    Equipment Utilized During Treatment  Gait belt    Activity Tolerance  Patient tolerated treatment well    Behavior During Therapy  [facility] for tasks assessed/performed       Past Medical History:  Diagnosis Date  . Allergic rhinitis   . Anemia   . Bipolar disorder ([place])   . Elevated PSA    had biopsy at [facility] around 2009-2010 negative for malignancy  . Erectile dysfunction   . GERD (gastroesophageal reflux disease)   . Leucopenia   . Osteoarthritis    in hands  . Prostatitis   . Sigmoid polyp     Past Surgical History:  Procedure Laterality Date  . COLONOSCOPY  10/20/12  . HERNIA REPAIR    . Left knee replacement    . Right shoulder replacement      There were no vitals filed for this visit.  Subjective Assessment - 11/12/18 0731    Subjective  Pt reports he had a good weekend and that he slept well last night. Pt reports 0/10 pain today. Pt reports he has performed HEP every day and that sit<>stands feel easier than before.    Limitations  Walking;House hold activities    Currently in Pain?  No/denies       Therapeutic Exercise:  SciFit, L 6.5, 10 min Hamstring stretching -2x30 sec B LEs Heel raises - 1x20 Single leg heel raise -  1x15 B sit<>stands -  1x15 Staggered sit<>stands -  1x8  Neuro: Cone tap obstacle course, unilaterally and contralaterally - 6x each;  RPS 6/10 (mildly unbalanced) Fishing (fixed BOS) on level ground and standing on foam: 3x; RPS 5/10  Gait with horizontal and vertical head turns - 4x each       PT Long Term Goals - 10/31/18 1541      PT LONG TERM GOAL #1   Title  Pt will perform 5xSTS in <12 seconds without requiring multiple attempts to iniate stand in order to demonstrate increase in LE power.    Baseline  Pt 5xSTS 13 seconds with mutliple attempts to initate first stand 10/31/2018    Time  4    Period  Weeks    Status  New    Target Date  11/28/18      PT LONG TERM GOAL #2   Title  Pt. will demonstrate a score of at least 52/56 on Berg to indicate decrease in fall risk.    Baseline  Pt Berg score is 46/56 10/31/2018    Time  4    Period  Weeks    Status  New    Target Date  11/28/18      PT LONG TERM GOAL #3   Title  Pt will score 76 on  FOTO to indicate an increase in functional mobility    Baseline  PT FOTO 73 11/01/2018    Time  4    Period  Weeks    Status  New    Target Date  11/28/18      PT LONG TERM GOAL #4   Title  Pt will demonstrate 25 full ROM single leg heel raises on B LEs to indicate increased endurance of B plantar flexors for improved balance    Baseline  R 20x with decreased range, L 25 with decreased range with WB through UE 10/31/2018    Time  4    Period  Weeks    Status  New    Target Date  11/28/18      PT LONG TERM GOAL #5   Title  Pt will demonstrate WFL hamstring length for improved gait mechanics    Baseline  Hamstring length assesment: L -45 deg, R -32 deg. 10/31/2018    Time  4    Period  Weeks    Status  New    Target Date  11/28/18            Plan - 11/12/18 [id number]    Clinical Impression Statement  Pt has decreased hamstring length on L LE compared to R during supine hamstring stretching, with a difference of approximately 10-15 deg. When performing unilateral cone taps pt with difficulty with eccentric control of hip flexors B, knocking over several cones, but with  greatest difficulty with L LE as stance LE. However, pt improved dynamic balance and eccentric hip flexor control with cuing to decreased speed. Pt also had variable BOS and scissoring gait with vertical and horizontal head turns. Pt exhibited difficulty with SLB during single leg heel raises, requiring B UE support on // bars and demonstrating increased UE weightbearing. Pt will continue to benefit from further skilled therapy to increase dynamic balance and B LE strength.    Personal Factors and Comorbidities  Age    Examination-Activity Limitations  Locomotion Level    Examination-Participation Restrictions  Cleaning;Community Activity;Yard Work    [employer] officer  Evolving/Moderate complexity    Clinical Decision Making  Moderate    Rehab Potential  Good    PT Frequency  2x / week    PT Duration  4 weeks    PT Treatment/Interventions  ADLs/Self Care Home Management;Moist Heat;Cryotherapy;Electrical Stimulation;DME Instruction;Gait training;Stair training;Functional mobility training;Therapeutic activities;Therapeutic exercise;Balance training;Neuromuscular re-education;Patient/family education;Orthotic Fit/Training;Manual techniques;Compression bandaging;Energy conservation    PT Next Visit Plan  Progress balance exercises; progress LE strengthening; dynamic balance exercise    Consulted and Agree with Plan of Care  Patient       Patient will benefit from skilled therapeutic intervention in order to improve the following deficits and impairments:  Abnormal gait, Improper body mechanics, Pain, Decreased coordination, Decreased mobility, Postural dysfunction, Decreased activity tolerance, Decreased endurance, Decreased range of motion, Decreased strength, Decreased balance, Difficulty walking, Impaired flexibility  Visit Diagnosis: Balance disorder  Muscle weakness (generalized)  History of falling  Pain in right leg  Pain in left leg     Problem List Patient  Active Problem List   Diagnosis Date Noted  . B12 deficiency 11/05/2018  . Abnormal SPEP 10/19/2018  . Normocytic anemia 10/19/2018  . Leukopenia 10/19/2018  . Lymphopenia 06/07/2016  . Abnormal weight loss 06/07/2016  . Community acquired pneumonia of left lower lobe of lung ([place])   . Pneumonia of left lower lobe due to Streptococcus pneumoniae ([place])   .  Acute respiratory failure ([place])   . Bipolar disorder with moderate depression ([place]) 03/01/2016  . Hypotension 02/29/2016  . IDA (iron deficiency anemia) 06/19/2014  . Leucopenia 06/19/2014   Pura Spice, PT, DPT # [address], SPT 11/12/2018, 8:54 AM  [place] [facility] [address] [place], [place], [address] Phone: [phone]   Fax:  [phone]  Name: David [name] MRN: [id number] Date of Birth: 04-May-1940

## 2018-11-13 DIAGNOSIS — M25551 Pain in right hip: Secondary | ICD-10-CM | POA: Diagnosis not present

## 2018-11-13 DIAGNOSIS — E559 Vitamin D deficiency, unspecified: Secondary | ICD-10-CM | POA: Diagnosis not present

## 2018-11-13 DIAGNOSIS — R600 Localized edema: Secondary | ICD-10-CM | POA: Diagnosis not present

## 2018-11-13 DIAGNOSIS — D72819 Decreased white blood cell count, unspecified: Secondary | ICD-10-CM | POA: Diagnosis not present

## 2018-11-13 DIAGNOSIS — G44229 Chronic tension-type headache, not intractable: Secondary | ICD-10-CM | POA: Diagnosis not present

## 2018-11-13 DIAGNOSIS — Z87891 Personal history of nicotine dependence: Secondary | ICD-10-CM | POA: Diagnosis not present

## 2018-11-13 DIAGNOSIS — G629 Polyneuropathy, unspecified: Secondary | ICD-10-CM | POA: Diagnosis not present

## 2018-11-13 DIAGNOSIS — M791 Myalgia, unspecified site: Secondary | ICD-10-CM | POA: Diagnosis not present

## 2018-11-13 DIAGNOSIS — E538 Deficiency of other specified B group vitamins: Secondary | ICD-10-CM | POA: Diagnosis not present

## 2018-11-14 ENCOUNTER — Ambulatory Visit: Payer: Medicare HMO | Admitting: Physical Therapy

## 2018-11-14 ENCOUNTER — Other Ambulatory Visit: Payer: Self-pay

## 2018-11-14 ENCOUNTER — Encounter: Payer: Self-pay | Admitting: Physical Therapy

## 2018-11-14 VITALS — BP 120/78

## 2018-11-14 DIAGNOSIS — R2689 Other abnormalities of gait and mobility: Secondary | ICD-10-CM | POA: Diagnosis not present

## 2018-11-14 DIAGNOSIS — Z9181 History of falling: Secondary | ICD-10-CM | POA: Diagnosis not present

## 2018-11-14 DIAGNOSIS — M6281 Muscle weakness (generalized): Secondary | ICD-10-CM | POA: Diagnosis not present

## 2018-11-14 DIAGNOSIS — M79605 Pain in left leg: Secondary | ICD-10-CM | POA: Diagnosis not present

## 2018-11-14 DIAGNOSIS — M79604 Pain in right leg: Secondary | ICD-10-CM | POA: Diagnosis not present

## 2018-11-14 NOTE — Therapy (Signed)
[place] [facility] [address] [place], [place], [address] Phone: [phone]   Fax:  [phone]  Physical Therapy Treatment  Patient Details  Name: David [name] MRN: [id number] Date of Birth: August 31, 1940 Referring Provider (PT): Dr. Annalee Genta,   Encounter Date: 11/14/2018  PT End of Session - 11/14/18 0837    Visit Number  5    Number of Visits  8    Date for PT Re-Evaluation  11/28/18    Authorization - Visit Number  5    Authorization - Number of Visits  10    PT Start Time  0731    PT Stop Time  0826    PT Time Calculation (min)  55 min    Equipment Utilized During Treatment  Gait belt    Activity Tolerance  Patient tolerated treatment well    Behavior During Therapy  [facility] for tasks assessed/performed       Past Medical History:  Diagnosis Date  . Allergic rhinitis   . Anemia   . Bipolar disorder ([place])   . Elevated PSA    had biopsy at [facility] around 2009-2010 negative for malignancy  . Erectile dysfunction   . GERD (gastroesophageal reflux disease)   . Leucopenia   . Osteoarthritis    in hands  . Prostatitis   . Sigmoid polyp     Past Surgical History:  Procedure Laterality Date  . COLONOSCOPY  10/20/12  . HERNIA REPAIR    . Left knee replacement    . Right shoulder replacement      Vitals:   11/14/18 0910  BP: 120/78    Subjective Assessment - 11/14/18 0735    Subjective  Pt reports his "equilibrium is off" today. Pt reports he had interrupted sleep. Pt reports no pain today.    Limitations  Walking;House hold activities    Currently in Pain?  No/denies      BP Seated:  120/73, HR 99  Therapeutic Exercise:  SciFit 6.5, 10 min - Pt reports feeling better with SciFit  Sit<>stands - 1x8 Staggered sit<>stands - 1x10 B  Neuro:  Standing level ground EC - 1x30 sec each  Standing on foam EO, EC - 1x30 sec each Obstacle course, bending and picking up cones contralaterally - 4x B LEs  Standing on half  bosu, intermittent UE support - x 2 min. Slight posterior LOB requiring UE support to regain balance. Step-up/over onto 6" step - x10  Step-ups onto 6" step - 2x10 B cueing for posture correction     PT Long Term Goals - 10/31/18 1541      PT LONG TERM GOAL #1   Title  Pt will perform 5xSTS in <12 seconds without requiring multiple attempts to iniate stand in order to demonstrate increase in LE power.    Baseline  Pt 5xSTS 13 seconds with mutliple attempts to initate first stand 10/31/2018    Time  4    Period  Weeks    Status  New    Target Date  11/28/18      PT LONG TERM GOAL #2   Title  Pt. will demonstrate a score of at least 52/56 on Berg to indicate decrease in fall risk.    Baseline  Pt Berg score is 46/56 10/31/2018    Time  4    Period  Weeks    Status  New    Target Date  11/28/18      PT LONG TERM GOAL #  3   Title  Pt will score 76 on FOTO to indicate an increase in functional mobility    Baseline  PT FOTO 73 11/01/2018    Time  4    Period  Weeks    Status  New    Target Date  11/28/18      PT LONG TERM GOAL #4   Title  Pt will demonstrate 25 full ROM single leg heel raises on B LEs to indicate increased endurance of B plantar flexors for improved balance    Baseline  R 20x with decreased range, L 25 with decreased range with WB through UE 10/31/2018    Time  4    Period  Weeks    Status  New    Target Date  11/28/18      PT LONG TERM GOAL #5   Title  Pt will demonstrate WFL hamstring length for improved gait mechanics    Baseline  Hamstring length assesment: L -45 deg, R -32 deg. 10/31/2018    Time  4    Period  Weeks    Status  New    Target Date  11/28/18         Plan - 11/14/18 0835    Clinical Impression Statement  Pt initially reported disequilibrium with start of therex (on SciFIt), but after approx 5 min of exercise reported feeling better and no sx of disequilibrium. Pt still has difficulty with eccentric control of B hip flexors during stair cone tap  exercise (greater difficulty with L LE as stance LE). Pt also with increased postural sway with standing on half-bosu, requiring intermittent UE support for LOB posteriorly. Pt was able to regain balance independently without assist from SPT.  Pt will benefit from further skilled therapy to improve balance, reduce fall risk, and increase LE strength.    Personal Factors and Comorbidities  Age    Examination-Activity Limitations  Locomotion Level    Examination-Participation Restrictions  Cleaning;Community Activity;Yard Work    [employer]chant [employer] officer  Evolving/Moderate complexity    Clinical Decision Making  Moderate    Rehab Potential  Good    PT Frequency  2x / week    PT Duration  4 weeks    PT Treatment/Interventions  ADLs/Self Care Home Management;Moist Heat;Cryotherapy;Electrical Stimulation;DME Instruction;Gait training;Stair training;Functional mobility training;Therapeutic activities;Therapeutic exercise;Balance training;Neuromuscular re-education;Patient/family education;Orthotic Fit/Training;Manual techniques;Compression bandaging;Energy conservation    PT Next Visit Plan  Progress balance exercises; progress LE strengthening; dynamic balance exercise; assess goal progress, FOTO    Consulted and Agree with Plan of Care  Patient       Patient will benefit from skilled therapeutic intervention in order to improve the following deficits and impairments:  Abnormal gait, Improper body mechanics, Pain, Decreased coordination, Decreased mobility, Postural dysfunction, Decreased activity tolerance, Decreased endurance, Decreased range of motion, Decreased strength, Decreased balance, Difficulty walking, Impaired flexibility  Visit Diagnosis: Balance disorder  Muscle weakness (generalized)  History of falling  Pain in right leg  Pain in left leg     Problem List Patient Active Problem List   Diagnosis Date Noted  . B12 deficiency 11/05/2018  . Abnormal SPEP 10/19/2018   . Normocytic anemia 10/19/2018  . Leukopenia 10/19/2018  . Lymphopenia 06/07/2016  . Abnormal weight loss 06/07/2016  . Community acquired pneumonia of left lower lobe of lung ([place])   . Pneumonia of left lower lobe due to Streptococcus pneumoniae ([place])   . Acute respiratory failure ([place])   . Bipolar disorder with moderate depression (  [place]) 03/01/2016  . Hypotension 02/29/2016  . IDA (iron deficiency anemia) 06/19/2014  . Leucopenia 06/19/2014   Pura Spice, PT, DPT # [address], SPT 11/14/2018, 9:11 AM  [facility] [address] [place], [place], [address] Phone: [phone]   Fax:  [phone]  Name: David [name] MRN: [id number] Date of Birth: [birth year]

## 2018-11-19 ENCOUNTER — Encounter: Payer: Self-pay | Admitting: Physical Therapy

## 2018-11-19 ENCOUNTER — Other Ambulatory Visit: Payer: Self-pay

## 2018-11-19 ENCOUNTER — Ambulatory Visit: Payer: Medicare HMO | Admitting: Physical Therapy

## 2018-11-19 DIAGNOSIS — M79604 Pain in right leg: Secondary | ICD-10-CM

## 2018-11-19 DIAGNOSIS — M79605 Pain in left leg: Secondary | ICD-10-CM

## 2018-11-19 DIAGNOSIS — Z9181 History of falling: Secondary | ICD-10-CM

## 2018-11-19 DIAGNOSIS — R2689 Other abnormalities of gait and mobility: Secondary | ICD-10-CM | POA: Diagnosis not present

## 2018-11-19 DIAGNOSIS — M6281 Muscle weakness (generalized): Secondary | ICD-10-CM | POA: Diagnosis not present

## 2018-11-19 NOTE — Therapy (Signed)
[facility] [address] [place], [place], [address] Phone: [phone]   Fax:  [phone]  Physical Therapy Treatment  Patient Details  Name: David [name] MRN: [id number] Date of Birth: Jun 22, 1940 Referring Provider (PT): Dr. Annalee Genta,   Encounter Date: 11/19/2018  PT End of Session - 11/19/18 0802    Visit Number  6    Number of Visits  8    Date for PT Re-Evaluation  11/28/18    Authorization - Visit Number  6    Authorization - Number of Visits  10    PT Start Time  0727    PT Stop Time  0841    PT Time Calculation (min)  74 min    Equipment Utilized During Treatment  Gait belt    Activity Tolerance  Patient tolerated treatment well    Behavior During Therapy  [facility] for tasks assessed/performed       Past Medical History:  Diagnosis Date  . Allergic rhinitis   . Anemia   . Bipolar disorder ([place])   . Elevated PSA    had biopsy at [facility] around 2009-2010 negative for malignancy  . Erectile dysfunction   . GERD (gastroesophageal reflux disease)   . Leucopenia   . Osteoarthritis    in hands  . Prostatitis   . Sigmoid polyp     Past Surgical History:  Procedure Laterality Date  . COLONOSCOPY  10/20/12  . HERNIA REPAIR    . Left knee replacement    . Right shoulder replacement      There were no vitals filed for this visit.  Subjective Assessment - 11/19/18 0839    Subjective  Pt. reports he had a good weekend but reports an increase in R hip pain at end of last week.  No hip pain at this time.  Pt. states MD thought the hip pain was a kidney stone or bursitis.    Limitations  Walking;House hold activities    Patient Stated Goals  Increase LE strengthening/ independence with walking and balance tasks.    Currently in Pain?  No/denies          There.ex.:  Scifit L7 10 min. B UE/LE.   Supine B LE stretches (hamstring/ piriformis/ trunk rotn)- 3x each.  Encouraged daily stretching at home Standing B UE  ex. Program (issued GTB): sh. Flexion/ extension/ scap. Retraction/ bicep curls (email program) Reviewed LE ex.   Neuro.mm.:  SLS (requires //-bars to correct LOB), tandem stance/ gait (forward/ backward)- mirror feedback Resisted gait 2BTB 5x all 4-planes (focus on no UE assist/ balance/ upright posture).   Hallway activities: high marching/ lateral walking/ head turns     PT Long Term Goals - 10/31/18 1541      PT LONG TERM GOAL #1   Title  Pt will perform 5xSTS in <12 seconds without requiring multiple attempts to iniate stand in order to demonstrate increase in LE power.    Baseline  Pt 5xSTS 13 seconds with mutliple attempts to initate first stand 10/31/2018    Time  4    Period  Weeks    Status  New    Target Date  11/28/18      PT LONG TERM GOAL #2   Title  Pt. will demonstrate a score of at least 52/56 on Berg to indicate decrease in fall risk.    Baseline  Pt Berg score is 46/56 10/31/2018    Time  4  Period  Weeks    Status  New    Target Date  11/28/18      PT LONG TERM GOAL #3   Title  Pt will score 76 on FOTO to indicate an increase in functional mobility    Baseline  PT FOTO 73 11/01/2018    Time  4    Period  Weeks    Status  New    Target Date  11/28/18      PT LONG TERM GOAL #4   Title  Pt will demonstrate 25 full ROM single leg heel raises on B LEs to indicate increased endurance of B plantar flexors for improved balance    Baseline  R 20x with decreased range, L 25 with decreased range with WB through UE 10/31/2018    Time  4    Period  Weeks    Status  New    Target Date  11/28/18      PT LONG TERM GOAL #5   Title  Pt will demonstrate WFL hamstring length for improved gait mechanics    Baseline  Hamstring length assesment: L -45 deg, R -32 deg. 10/31/2018    Time  4    Period  Weeks    Status  New    Target Date  11/28/18            Plan - 11/19/18 0841    Clinical Impression Statement  PT unable to reproduce any R hip pain or symptoms during  supine stretching and palpation of R greater trochanter.  Pt. continues to have difficulty with higher level balance SLS/ tandem on Airex pads.  Pt. issued UE strengthening ex. program and encouraged proper posture/ balance with BOS.    Personal Factors and Comorbidities  Age    Examination-Activity Limitations  Locomotion Level    Examination-Participation Restrictions  Cleaning;Community Activity;Yard Work    [employer] officer  Evolving/Moderate complexity    Clinical Decision Making  Moderate    Rehab Potential  Good    PT Frequency  2x / week    PT Duration  4 weeks    PT Treatment/Interventions  ADLs/Self Care Home Management;Moist Heat;Cryotherapy;Electrical Stimulation;DME Instruction;Gait training;Stair training;Functional mobility training;Therapeutic activities;Therapeutic exercise;Balance training;Neuromuscular re-education;Patient/family education;Orthotic Fit/Training;Manual techniques;Compression bandaging;Energy conservation    PT Next Visit Plan  Progress balance exercises; progress LE strengthening; dynamic balance exercise; FOTO    Consulted and Agree with Plan of Care  Patient       Patient will benefit from skilled therapeutic intervention in order to improve the following deficits and impairments:  Abnormal gait, Improper body mechanics, Pain, Decreased coordination, Decreased mobility, Postural dysfunction, Decreased activity tolerance, Decreased endurance, Decreased range of motion, Decreased strength, Decreased balance, Difficulty walking, Impaired flexibility  Visit Diagnosis: Balance disorder  Muscle weakness (generalized)  History of falling  Pain in right leg  Pain in left leg     Problem List Patient Active Problem List   Diagnosis Date Noted  . B12 deficiency 11/05/2018  . Abnormal SPEP 10/19/2018  . Normocytic anemia 10/19/2018  . Leukopenia 10/19/2018  . Lymphopenia 06/07/2016  . Abnormal weight loss 06/07/2016  . Community  acquired pneumonia of left lower lobe of lung ([place])   . Pneumonia of left lower lobe due to Streptococcus pneumoniae ([place])   . Acute respiratory failure ([place])   . Bipolar disorder with moderate depression ([place]) 03/01/2016  . Hypotension 02/29/2016  . IDA (iron deficiency anemia) 06/19/2014  . Leucopenia 06/19/2014   Pura Spice,  PT, DPT # [id number] 11/19/2018, 9:58 AM  [facility] [address] [place], [place], [address] Phone: [phone]   Fax:  [phone]  Name: David [name] MRN: [id number] Date of Birth: [birth year]

## 2018-11-21 ENCOUNTER — Ambulatory Visit: Payer: Medicare HMO | Admitting: Physical Therapy

## 2018-11-21 ENCOUNTER — Other Ambulatory Visit: Payer: Self-pay

## 2018-11-21 ENCOUNTER — Encounter: Payer: Self-pay | Admitting: Physical Therapy

## 2018-11-21 DIAGNOSIS — R2689 Other abnormalities of gait and mobility: Secondary | ICD-10-CM

## 2018-11-21 DIAGNOSIS — Z9181 History of falling: Secondary | ICD-10-CM

## 2018-11-21 DIAGNOSIS — M79605 Pain in left leg: Secondary | ICD-10-CM | POA: Diagnosis not present

## 2018-11-21 DIAGNOSIS — M79604 Pain in right leg: Secondary | ICD-10-CM | POA: Diagnosis not present

## 2018-11-21 DIAGNOSIS — M6281 Muscle weakness (generalized): Secondary | ICD-10-CM

## 2018-11-21 NOTE — Therapy (Signed)
[facility] [address] [place], [place], [address] Phone: [phone]   Fax:  [phone]  Physical Therapy Treatment  Patient Details  Name: David [name] MRN: [id number] Date of Birth: 02-14-1941 Referring Provider (PT): Dr. Annalee Genta,   Encounter Date: 11/21/2018  PT End of Session - 11/21/18 0725    Visit Number  7    Number of Visits  8    Date for PT Re-Evaluation  11/28/18    Authorization - Visit Number  7    Authorization - Number of Visits  10    PT Start Time  0725    PT Stop Time  0822    PT Time Calculation (min)  57 min    Equipment Utilized During Treatment  Gait belt    Activity Tolerance  Patient tolerated treatment well    Behavior During Therapy  [facility] for tasks assessed/performed       Past Medical History:  Diagnosis Date  . Allergic rhinitis   . Anemia   . Bipolar disorder ([place])   . Elevated PSA    had biopsy at [facility] around 2009-2010 negative for malignancy  . Erectile dysfunction   . GERD (gastroesophageal reflux disease)   . Leucopenia   . Osteoarthritis    in hands  . Prostatitis   . Sigmoid polyp     Past Surgical History:  Procedure Laterality Date  . COLONOSCOPY  10/20/12  . HERNIA REPAIR    . Left knee replacement    . Right shoulder replacement      There were no vitals filed for this visit.  Subjective Assessment - 11/21/18 0723    Subjective  Pt reports no pain today.  Pt reports doing a lot of "outdoor lifting" yesterday in replace of HEP.    Limitations  Walking;House hold activities    Patient Stated Goals  Increase LE strengthening/ independence with walking and balance tasks.    Currently in Pain?  No/denies       5xSTS: 10.7 sec  MMT Hip Flexion: L 4+/5, R 4/5  Berg Score: 53/56  Single Leg Heel Raise Test: 25 with decreased range B with WB through UE    Therapeutic Exercise: Scifit L 6.5 x10 min Marching in // bars forward/ backward with 5# ankle weights -  x4 Lateral stepping in // bars with 5# ankle weights - x4 Resisted walk outs (backward/ lateral/ forward) in // bars with 5# ankle weights - x4 each Supine hamstring stretching-  2x30 sec      PT Long Term Goals - 11/21/18 2585      PT LONG TERM GOAL #1   Title  Pt will perform 5xSTS in <12 seconds without requiring multiple attempts to iniate stand in order to demonstrate increase in LE power.    Baseline  Pt 5xSTS 13 seconds with mutliple attempts to initate first stand 10/31/2018; Pt 5xSTS 10.7 seconds 11/21/2018    Time  4    Period  Weeks    Status  Achieved      PT LONG TERM GOAL #2   Title  Pt. will demonstrate a score of at least 52/56 on Berg to indicate decrease in fall risk.    Baseline  Pt Berg score is 46/56 10/31/2018; Pt Berg score is 53/56 11/21/2018    Time  4    Period  Weeks    Status  Achieved    Target Date  11/21/18  PT LONG TERM GOAL #3   Title  Pt will score 76 on FOTO to indicate an increase in functional mobility    Baseline  PT FOTO 73 11/01/2018    Time  4    Period  Weeks    Status  New    Target Date  11/21/18      PT LONG TERM GOAL #4   Title  Pt will demonstrate 25 full ROM single leg heel raises on B LEs to indicate increased endurance of B plantar flexors for improved balance    Baseline  R 20x with decreased range, L 25 with decreased range with WB through UE 10/31/2018; R/L 25 with decreased range B with WB through UE 11/21/2018    Time  4    Period  Weeks    Status  Partially Met      PT LONG TERM GOAL #5   Title  Pt will demonstrate WFL hamstring length for improved gait mechanics    Baseline  Hamstring length assesment: L -45 deg, R -32 deg. 10/31/2018    Time  4    Period  Weeks    Status  New            Plan - 11/21/18 0910    Clinical Impression Statement  Pt achieved 5xSTS (10.7 sec) and Berg (score: 53/56) goals today demonstrating increase in LE power and decrease in fall risk respectively.  Berg deficits are in tandem  stance, unable to hold >15 sec.  Pt MMT of hip flexion reveals L 4+/5, R 4/5.  Pt required postural cueing for LE strengthening in // bars to decrease trunk compensation.  Pt required CGA with occasional min assist to prevent anterior LOB with resisted forward walk outs.  Pt will benefit from further skilled therapy to increase LE strength and ROM.    Personal Factors and Comorbidities  Age    Examination-Activity Limitations  Locomotion Level    Examination-Participation Restrictions  Cleaning;Community Activity;Yard Work    [employer] officer  Evolving/Moderate complexity    Clinical Decision Making  Moderate    Rehab Potential  Good    PT Frequency  2x / week    PT Duration  4 weeks    PT Treatment/Interventions  ADLs/Self Care Home Management;Moist Heat;Cryotherapy;Electrical Stimulation;DME Instruction;Gait training;Stair training;Functional mobility training;Therapeutic activities;Therapeutic exercise;Balance training;Neuromuscular re-education;Patient/family education;Orthotic Fit/Training;Manual techniques;Compression bandaging;Energy conservation    PT Next Visit Plan  Reassess hamstring length, FOTO, LE strengthening    Consulted and Agree with Plan of Care  Patient       Patient will benefit from skilled therapeutic intervention in order to improve the following deficits and impairments:  Abnormal gait, Improper body mechanics, Pain, Decreased coordination, Decreased mobility, Postural dysfunction, Decreased activity tolerance, Decreased endurance, Decreased range of motion, Decreased strength, Decreased balance, Difficulty walking, Impaired flexibility  Visit Diagnosis: Balance disorder  Muscle weakness (generalized)  History of falling     Problem List Patient Active Problem List   Diagnosis Date Noted  . B12 deficiency 11/05/2018  . Abnormal SPEP 10/19/2018  . Normocytic anemia 10/19/2018  . Leukopenia 10/19/2018  . Lymphopenia 06/07/2016  . Abnormal  weight loss 06/07/2016  . Community acquired pneumonia of left lower lobe of lung ([place])   . Pneumonia of left lower lobe due to Streptococcus pneumoniae ([place])   . Acute respiratory failure ([place])   . Bipolar disorder with moderate depression ([place]) 03/01/2016  . Hypotension 02/29/2016  . IDA (iron deficiency anemia) 06/19/2014  . Leucopenia  06/19/2014   Michael C Sherk, PT, DPT # [id number]  , SPT 11/21/2018, 9:14 AM  [place] [facility] [address] [place], [place], [address] Phone: [phone]   Fax:  [phone]  Name: Charly H Foglesong MRN: [id number] Date of Birth: 06/30/1940   

## 2018-11-23 ENCOUNTER — Inpatient Hospital Stay: Payer: Medicare HMO | Attending: Hematology and Oncology

## 2018-11-26 ENCOUNTER — Ambulatory Visit: Payer: Medicare HMO | Attending: Internal Medicine | Admitting: Physical Therapy

## 2018-11-26 ENCOUNTER — Encounter: Payer: Self-pay | Admitting: Physical Therapy

## 2018-11-26 ENCOUNTER — Other Ambulatory Visit: Payer: Self-pay

## 2018-11-26 DIAGNOSIS — M79604 Pain in right leg: Secondary | ICD-10-CM | POA: Diagnosis not present

## 2018-11-26 DIAGNOSIS — Z9181 History of falling: Secondary | ICD-10-CM | POA: Diagnosis not present

## 2018-11-26 DIAGNOSIS — M6281 Muscle weakness (generalized): Secondary | ICD-10-CM | POA: Diagnosis not present

## 2018-11-26 DIAGNOSIS — R2689 Other abnormalities of gait and mobility: Secondary | ICD-10-CM | POA: Insufficient documentation

## 2018-11-26 DIAGNOSIS — M79605 Pain in left leg: Secondary | ICD-10-CM | POA: Diagnosis not present

## 2018-11-26 NOTE — Therapy (Signed)
[facility] [address] [place], [place], [address] Phone: [phone]   Fax:  [phone]  Physical Therapy Treatment  Patient Details  Name: David [name] MRN: [id number] Date of Birth: 09-Oct-1940 Referring Provider (PT): Dr. Annalee Genta,   Encounter Date: 11/26/2018  PT End of Session - 11/26/18 0738    Visit Number  8    Number of Visits  8    Date for PT Re-Evaluation  11/28/18    Authorization - Visit Number  8    Authorization - Number of Visits  10    PT Start Time  0731    PT Stop Time  0837    PT Time Calculation (min)  66 min    Equipment Utilized During Treatment  Gait belt    Activity Tolerance  Patient tolerated treatment well    Behavior During Therapy  [facility] for tasks assessed/performed       Past Medical History:  Diagnosis Date  . Allergic rhinitis   . Anemia   . Bipolar disorder ([place])   . Elevated PSA    had biopsy at [facility] around 2009-2010 negative for malignancy  . Erectile dysfunction   . GERD (gastroesophageal reflux disease)   . Leucopenia   . Osteoarthritis    in hands  . Prostatitis   . Sigmoid polyp     Past Surgical History:  Procedure Laterality Date  . COLONOSCOPY  10/20/12  . HERNIA REPAIR    . Left knee replacement    . Right shoulder replacement      There were no vitals filed for this visit.  Subjective Assessment - 11/26/18 0731    Subjective  Pt. states he is doing well.  Pt. reports no pain in B feet this morning.  Pt. states he did his HEP last night.  PT assessed B foot orthosis prior to pt. tying sneakers.    Limitations  Walking;House hold activities    Patient Stated Goals  Increase LE strengthening/ independence with walking and balance tasks.    Currently in Pain?  No/denies        There.ex.:  Scifit L7 10 min. B UE/LE (warm-up/no charge)- discussed HEP/ activities at home Nautilus: resisted walk outs 60# forward/ backwards and 50# lateral with no UE assist (CGA  for safety/ verbal cuing). Standing step touches L/R Supine hamstring (proximal/distal) stretches 3x each  Neuro.mm.:  Star ex.: with/without cones 5x on L/R each.  Airex pad SLS on L/R (mirror feedback)- CGA for safety/ verbal instruction.   Standing cone taps (different colors)- verbal cuing Walking outside/ in clinic on varying terrain working on maintaining proper BOS/ heel strike/ upright posture Discussed HEP/ walking      PT Long Term Goals - 11/21/18 9741      PT LONG TERM GOAL #1   Title  Pt will perform 5xSTS in <12 seconds without requiring multiple attempts to iniate stand in order to demonstrate increase in LE power.    Baseline  Pt 5xSTS 13 seconds with mutliple attempts to initate first stand 10/31/2018; Pt 5xSTS 10.7 seconds 11/21/2018    Time  4    Period  Weeks    Status  Achieved      PT LONG TERM GOAL #2   Title  Pt. will demonstrate a score of at least 52/56 on Berg to indicate decrease in fall risk.    Baseline  Pt Berg score is 46/56 10/31/2018; Pt Berg score is 53/56 11/21/2018  Time  4    Period  Weeks    Status  Achieved    Target Date  11/21/18      PT LONG TERM GOAL #3   Title  Pt will score 76 on FOTO to indicate an increase in functional mobility    Baseline  PT FOTO 73 11/01/2018    Time  4    Period  Weeks    Status  New    Target Date  11/21/18      PT LONG TERM GOAL #4   Title  Pt will demonstrate 25 full ROM single leg heel raises on B LEs to indicate increased endurance of B plantar flexors for improved balance    Baseline  R 20x with decreased range, L 25 with decreased range with WB through UE 10/31/2018; R/L 25 with decreased range B with WB through UE 11/21/2018    Time  4    Period  Weeks    Status  Partially Met      PT LONG TERM GOAL #5   Title  Pt will demonstrate WFL hamstring length for improved gait mechanics    Baseline  Hamstring length assesment: L -45 deg, R -32 deg. 10/31/2018    Time  4    Period  Weeks    Status  New           Plan - 11/26/18 1751    Clinical Impression Statement  Pt. works really hard during PT tx. session and PT focused on higher level balance/ LE stability ex.  Pt. able to complete Star ex./ cone taps at varying planes/ distances with CGA for safety but no LOB.  Pt. requires cuing to increase step pattern with resisted walking/ step pattern without UE assist.  Pt. is progressing well with LE strengthening/ balance with no increase c/o B foot/lower leg pain.  Possible discharge from PT next visit with focus on HEP.    Personal Factors and Comorbidities  Age    Examination-Activity Limitations  Locomotion Level    Examination-Participation Restrictions  Cleaning;Community Activity;Yard Work    [employer]chant [employer] officer  Evolving/Moderate complexity    Clinical Decision Making  Moderate    Rehab Potential  Good    PT Frequency  2x / week    PT Duration  4 weeks    PT Treatment/Interventions  ADLs/Self Care Home Management;Moist Heat;Cryotherapy;Electrical Stimulation;DME Instruction;Gait training;Stair training;Functional mobility training;Therapeutic activities;Therapeutic exercise;Balance training;Neuromuscular re-education;Patient/family education;Orthotic Fit/Training;Manual techniques;Compression bandaging;Energy conservation    PT Next Visit Plan  B LE strengthening/ higher level dynamic tasks (CHECK GOALS and discuss POC).    Consulted and Agree with Plan of Care  Patient       Patient will benefit from skilled therapeutic intervention in order to improve the following deficits and impairments:  Abnormal gait, Improper body mechanics, Pain, Decreased coordination, Decreased mobility, Postural dysfunction, Decreased activity tolerance, Decreased endurance, Decreased range of motion, Decreased strength, Decreased balance, Difficulty walking, Impaired flexibility  Visit Diagnosis: Balance disorder  Muscle weakness (generalized)  History of falling  Pain in right  leg  Pain in left leg     Problem List Patient Active Problem List   Diagnosis Date Noted  . B12 deficiency 11/05/2018  . Abnormal SPEP 10/19/2018  . Normocytic anemia 10/19/2018  . Leukopenia 10/19/2018  . Lymphopenia 06/07/2016  . Abnormal weight loss 06/07/2016  . Community acquired pneumonia of left lower lobe of lung   . Pneumonia of left lower lobe due to Streptococcus pneumoniae ([place])   .  Acute respiratory failure ([place])   . Bipolar disorder with moderate depression ([place]) 03/01/2016  . Hypotension 02/29/2016  . IDA (iron deficiency anemia) 06/19/2014  . Leucopenia 06/19/2014   Pura Spice, PT, DPT # [id number] 11/26/2018, 10:24 AM  [place] [facility] [address] [place], [place], [address] Phone: [phone]   Fax:  [phone]  Name: David [name] MRN: [id number] Date of Birth: October 28, 1940

## 2018-11-28 ENCOUNTER — Other Ambulatory Visit: Payer: Self-pay

## 2018-11-28 ENCOUNTER — Encounter: Payer: Self-pay | Admitting: Physical Therapy

## 2018-11-28 ENCOUNTER — Ambulatory Visit: Payer: Medicare HMO | Admitting: Physical Therapy

## 2018-11-28 DIAGNOSIS — M79605 Pain in left leg: Secondary | ICD-10-CM | POA: Diagnosis not present

## 2018-11-28 DIAGNOSIS — R2689 Other abnormalities of gait and mobility: Secondary | ICD-10-CM | POA: Diagnosis not present

## 2018-11-28 DIAGNOSIS — M6281 Muscle weakness (generalized): Secondary | ICD-10-CM | POA: Diagnosis not present

## 2018-11-28 DIAGNOSIS — M79604 Pain in right leg: Secondary | ICD-10-CM | POA: Diagnosis not present

## 2018-11-28 DIAGNOSIS — Z9181 History of falling: Secondary | ICD-10-CM

## 2018-11-28 NOTE — Therapy (Signed)
[place] [facility] [address]. [place], [place], [address] Phone: [phone]   Fax:  [phone]  Physical Therapy Treatment  Patient Details  Name: David [name] MRN: [id number] Date of Birth: October 22, 1940 Referring Provider (PT): Dr. Annalee Genta,   Encounter Date: 11/28/2018  PT End of Session - 11/28/18 0732    Visit Number  9    Number of Visits  9    Date for PT Re-Evaluation  11/28/18    Authorization - Visit Number  1    Authorization - Number of Visits  10    PT Start Time  0724    PT Stop Time  0827    PT Time Calculation (min)  63 min    Equipment Utilized During Treatment  Gait belt    Activity Tolerance  Patient tolerated treatment well    Behavior During Therapy  [facility] for tasks assessed/performed       Past Medical History:  Diagnosis Date  . Allergic rhinitis   . Anemia   . Bipolar disorder ([place])   . Elevated PSA    had biopsy at [facility] around 2009-2010 negative for malignancy  . Erectile dysfunction   . GERD (gastroesophageal reflux disease)   . Leucopenia   . Osteoarthritis    in hands  . Prostatitis   . Sigmoid polyp     Past Surgical History:  Procedure Laterality Date  . COLONOSCOPY  10/20/12  . HERNIA REPAIR    . Left knee replacement    . Right shoulder replacement      There were no vitals filed for this visit.  Subjective Assessment - 11/28/18 0728    Subjective  Pt. reports no new complaints.  Pt. states he hasn't had any pain in feet over past couple of weeks.    Limitations  Walking;House hold activities    Patient Stated Goals  Increase LE strengthening/ independence with walking and balance tasks.    Currently in Pain?  No/denies        Reviewed goals.  There.ex.:  Scifit L6.5 B UE/LE 10 min. (warm-up/ no charge)- consistent cadence Reviewed HEP (issued RTB for horizontal sh. Flexion/ ER).  Pt. Will continue to use GTB for all other UE ex. Walking in PT clinic with increase hip  flexion/ arm swing with added alt. UE/LE touches.   Standing hip ex./ lateral walking/ step ups/ overs (6" step)- mirror feedback   PT Education - 11/28/18 0826    Education Details  Reviewed HEP    Person(s) Educated  Patient    Methods  Explanation;Demonstration    Comprehension  Verbalized understanding;Returned demonstration          PT Long Term Goals - 11/28/18 0743      PT LONG TERM GOAL #1   Title  Pt will perform 5xSTS in <12 seconds without requiring multiple attempts to iniate stand in order to demonstrate increase in LE power.    Baseline  Pt 5xSTS 13 seconds with mutliple attempts to initate first stand 10/31/2018; Pt 5xSTS 10.7 seconds 11/21/2018    Time  4    Period  Weeks    Status  Achieved      PT LONG TERM GOAL #2   Title  Pt. will demonstrate a score of at least 52/56 on Berg to indicate decrease in fall risk.    Baseline  Pt Berg score is 46/56 10/31/2018; Pt Berg score is 53/56 11/21/2018    Time  4  Period  Weeks    Status  Achieved    Target Date  11/21/18      PT LONG TERM GOAL #3   Title  Pt will score 76 on FOTO to indicate an increase in functional mobility    Baseline  PT FOTO 73 11/01/2018.  Pt. had a difficultly answering questions for f/u visit.    Time  4    Period  Weeks    Status  Not Met    Target Date  11/28/18      PT LONG TERM GOAL #4   Title  Pt will demonstrate 25 full ROM single leg heel raises on B LEs to indicate increased endurance of B plantar flexors for improved balance    Baseline  R 20x with decreased range, L 25 with decreased range with WB through UE 10/31/2018; R/L 25 with decreased range B with WB through UE 11/21/2018    Time  4    Period  Weeks    Status  Achieved    Target Date  11/28/18      PT LONG TERM GOAL #5   Title  Pt will demonstrate WFL hamstring length for improved gait mechanics    Baseline  Hamstring length assesment: L -32 deg, R -32 deg. 11/28/2018    Time  4    Period  Weeks    Status  Achieved     Target Date  11/28/18            Plan - 11/28/18 0732    Clinical Impression Statement  Pt. has progressed well towards all PT goals.  Pt. reports no pain in B lower legs/feet over past several weeks.  No falls or LOB noted.  Pt. will continue with progressive LE ex. program and daily walking program to maintain all functional gains.  Pt. instructed to contact PT if any issues or concerns over next several weeks/months.  Discharge from PT at this time.    Personal Factors and Comorbidities  Age    Examination-Activity Limitations  Locomotion Level    Examination-Participation Restrictions  Cleaning;Community Activity;Yard Work    Stability/Clinical Decision Making  Evolving/Moderate complexity    Clinical Decision Making  Moderate    Rehab Potential  Good    PT Frequency  One time visit    PT Treatment/Interventions  ADLs/Self Care Home Management;Moist Heat;Cryotherapy;Electrical Stimulation;DME Instruction;Gait training;Stair training;Functional mobility training;Therapeutic activities;Therapeutic exercise;Balance training;Neuromuscular re-education;Patient/family education;Orthotic Fit/Training;Manual techniques;Compression bandaging;Energy conservation    PT Next Visit Plan  Discharge visit.  All goals met.    Consulted and Agree with Plan of Care  Patient       Patient will benefit from skilled therapeutic intervention in order to improve the following deficits and impairments:  Abnormal gait, Improper body mechanics, Pain, Decreased coordination, Decreased mobility, Postural dysfunction, Decreased activity tolerance, Decreased endurance, Decreased range of motion, Decreased strength, Decreased balance, Difficulty walking, Impaired flexibility  Visit Diagnosis: Balance disorder  Muscle weakness (generalized)  History of falling  Pain in right leg  Pain in left leg     Problem List Patient Active Problem List   Diagnosis Date Noted  . B12 deficiency 11/05/2018  .  Abnormal SPEP 10/19/2018  . Normocytic anemia 10/19/2018  . Leukopenia 10/19/2018  . Lymphopenia 06/07/2016  . Abnormal weight loss 06/07/2016  . Community acquired pneumonia of left lower lobe of lung   . Pneumonia of left lower lobe due to Streptococcus pneumoniae ([place])   . Acute respiratory failure ([place])   .  Bipolar disorder with moderate depression ([place]) 03/01/2016  . Hypotension 02/29/2016  . IDA (iron deficiency anemia) 06/19/2014  . Leucopenia 06/19/2014   Pura Spice, PT, DPT # [phone] 11/28/2018, 8:30 AM  [place] [facility] [address] [place], [place], [address] Phone: [phone]   Fax:  [phone]  Name: TAMIM SKOG MRN: [id number] Date of Birth: 11/10/1940

## 2018-11-28 NOTE — Patient Instructions (Signed)
Access Code: [email or web address]  URL: [email or web address]  Date: 11/28/2018  Prepared by: Dorcas Carrow   Exercises  Seated Single Arm Shoulder External Rotation with Self-Anchored Resistance - 15 reps - 2 sets - 1x daily - 4x weekly  Standing Shoulder Flexion with Posterior Anchored Resistance - 15 reps - 2 sets - 1x daily - 4x weekly  Standing Shoulder Flexion with Resistance - 15 reps - 2 sets - 1x daily - 4x weekly  Standing Single Arm Shoulder Abduction with Resistance - 15 reps - 2 sets - 1x daily - 4x weekly  Scapular Retraction with Resistance - 15 reps - 2 sets - 1x daily - 4x weekly

## 2018-11-30 DIAGNOSIS — S40262A Insect bite (nonvenomous) of left shoulder, initial encounter: Secondary | ICD-10-CM | POA: Diagnosis not present

## 2018-11-30 DIAGNOSIS — S40261A Insect bite (nonvenomous) of right shoulder, initial encounter: Secondary | ICD-10-CM | POA: Diagnosis not present

## 2018-11-30 DIAGNOSIS — L820 Inflamed seborrheic keratosis: Secondary | ICD-10-CM | POA: Diagnosis not present

## 2018-11-30 DIAGNOSIS — L570 Actinic keratosis: Secondary | ICD-10-CM | POA: Diagnosis not present

## 2018-11-30 DIAGNOSIS — W57XXXA Bitten or stung by nonvenomous insect and other nonvenomous arthropods, initial encounter: Secondary | ICD-10-CM | POA: Diagnosis not present

## 2018-11-30 DIAGNOSIS — B078 Other viral warts: Secondary | ICD-10-CM | POA: Diagnosis not present

## 2018-12-03 ENCOUNTER — Encounter: Payer: Medicare HMO | Admitting: Physical Therapy

## 2018-12-05 ENCOUNTER — Encounter: Payer: Medicare HMO | Admitting: Physical Therapy

## 2019-01-03 DIAGNOSIS — I890 Lymphedema, not elsewhere classified: Secondary | ICD-10-CM | POA: Diagnosis not present

## 2019-01-28 DIAGNOSIS — N1831 Chronic kidney disease, stage 3a: Secondary | ICD-10-CM | POA: Diagnosis not present

## 2019-01-28 DIAGNOSIS — R809 Proteinuria, unspecified: Secondary | ICD-10-CM | POA: Diagnosis not present

## 2019-01-28 DIAGNOSIS — R3129 Other microscopic hematuria: Secondary | ICD-10-CM | POA: Diagnosis not present

## 2019-01-28 DIAGNOSIS — R600 Localized edema: Secondary | ICD-10-CM | POA: Diagnosis not present

## 2019-01-31 ENCOUNTER — Other Ambulatory Visit: Payer: Self-pay

## 2019-01-31 ENCOUNTER — Ambulatory Visit
Admission: EM | Admit: 2019-01-31 | Discharge: 2019-01-31 | Disposition: A | Discharge status: Home / Self Care | Payer: Medicare HMO | Attending: Emergency Medicine | Admitting: Emergency Medicine

## 2019-01-31 ENCOUNTER — Ambulatory Visit (INDEPENDENT_AMBULATORY_CARE_PROVIDER_SITE_OTHER)
Admit: 2019-01-31 | Discharge: 2019-01-31 | Disposition: A | Discharge status: Home / Self Care | Payer: Medicare HMO | Attending: Family Medicine | Admitting: Family Medicine

## 2019-01-31 DIAGNOSIS — S0990XA Unspecified injury of head, initial encounter: Secondary | ICD-10-CM

## 2019-01-31 DIAGNOSIS — S0181XA Laceration without foreign body of other part of head, initial encounter: Secondary | ICD-10-CM | POA: Diagnosis not present

## 2019-01-31 DIAGNOSIS — W19XXXA Unspecified fall, initial encounter: Secondary | ICD-10-CM

## 2019-01-31 DIAGNOSIS — R519 Headache, unspecified: Secondary | ICD-10-CM | POA: Diagnosis not present

## 2019-01-31 NOTE — ED Notes (Signed)
CT Authorization obtained from [facility]. Approval [id number] Authorized from 01/31/2019-03/02/2019

## 2019-01-31 NOTE — ED Triage Notes (Signed)
Patient states that his house slipper got tripped up and he fell and hit his door hinge around 1130am. Patient states that he applied pressure and has stopped bleeding. Patient with laceration to above his left eye extending to eye brow on forehead. Denies any blood thinners.

## 2019-01-31 NOTE — ED Provider Notes (Signed)
[facility] ____________________________________________  Time seen: Approximately 2:47 PM  I have reviewed the triage vital signs and the nursing notes.   HISTORY  Chief Complaint Fall and Head Laceration   HPI David [name] is a 78 y.o. male present for evaluation left forehead laceration and head injury that occurred at approximately 1130 this morning.  States he was wearing a pair of bedroom slippers that were too big, while he was vacuuming his floor, and subsequently tripped and fell forward.  States that he hit his left forehead on the door hinge bracket.  He states he then did fall to the floor.  Denies loss of consciousness.  States mild headache pain directly at the laceration site.  States did apply antibiotic ointment and pressure but continued bleeding.  Denies blood thinner use.  States he fell only due to tripping.  Denies chest pain or shortness of breath, abdominal pain.  Denies paresthesias, unilateral weakness, dizziness, vision changes, confusion.  Last tetanus immunization in 2012.  Denies other complaints.  Ezequiel Kayser, MD : PCP   Past Medical History:  Diagnosis Date  . Allergic rhinitis   . Anemia   . Bipolar disorder ([place])   . Elevated PSA    had biopsy at [facility] around 2009-2010 negative for malignancy  . Erectile dysfunction   . GERD (gastroesophageal reflux disease)   . Leucopenia   . Osteoarthritis    in hands  . Prostatitis   . Sigmoid polyp     Patient Active Problem List   Diagnosis Date Noted  . B12 deficiency 11/05/2018  . Abnormal SPEP 10/19/2018  . Normocytic anemia 10/19/2018  . Leukopenia 10/19/2018  . Lymphopenia 06/07/2016  . Abnormal weight loss 06/07/2016  . Community acquired pneumonia of left lower lobe of lung   . Pneumonia of left lower lobe due to Streptococcus pneumoniae ([place])   . Acute respiratory failure ([place])   . Bipolar disorder with moderate depression ([place]) 03/01/2016  . Hypotension 02/29/2016  . IDA  (iron deficiency anemia) 06/19/2014  . Leucopenia 06/19/2014    Past Surgical History:  Procedure Laterality Date  . COLONOSCOPY  10/20/12  . HERNIA REPAIR    . Left knee replacement    . Right shoulder replacement       No current facility-administered medications for this encounter.  Current Outpatient Medications:  .  acetaminophen (TYLENOL) 500 MG tablet, Take 500 mg by mouth every 8 (eight) hours as needed for moderate pain. , Disp: , Rfl:  .  Blood Pressure Monitor DEVI, , Disp: , Rfl:  .  Cholecalciferol (D 1000) 1000 units capsule, Take by mouth., Disp: , Rfl:  .  FIRST AID KIT, , Disp: , Rfl:  .  Multiple Vitamin (MULTIVITAMIN) tablet, Take 1 tablet by mouth daily., Disp: , Rfl:  .  multivitamin-lutein (OCUVITE-LUTEIN) CAPS capsule, Take 1 capsule by mouth daily., Disp: , Rfl:  .  azelastine (ASTELIN) 0.1 % nasal spray, Place into the nose., Disp: , Rfl:   Allergies Quetiapine  Family History  Problem Relation Age of Onset  . CVA Mother   . CAD Father   . Prostate cancer Neg Hx   . Kidney cancer Neg Hx   . Bladder Cancer Neg Hx     Social History Social History   Tobacco Use  . Smoking status: Former Smoker    Types: Cigarettes    Quit date: 02/22/1975    Years since quitting: 43.9  . Smokeless tobacco: Never Used  Substance Use  Topics  . Alcohol use: No    Alcohol/week: 0.0 standard drinks  . Drug use: Never    Review of Systems Constitutional: No fever Eyes: No visual changes. Cardiovascular: Denies chest pain. Respiratory: Denies shortness of breath. Gastrointestinal: No abdominal pain.  No nausea, no vomiting.  No diarrhea.  No constipation. Genitourinary: Negative for dysuria. Musculoskeletal: Negative for back pain. Skin: Positive laceration. Neurological: Positive for headaches. Negative for focal weakness or numbness.    ____________________________________________   PHYSICAL EXAM:  VITAL SIGNS: ED Triage Vitals  Enc Vitals Group      BP 01/31/19 1329 113/69     Pulse Rate 01/31/19 1329 74     Resp 01/31/19 1329 17     Temp 01/31/19 1329 98.2 F (36.8 C)     Temp Source 01/31/19 1329 Oral     SpO2 01/31/19 1329 99 %     Weight 01/31/19 1327 150 lb (68 kg)     Height 01/31/19 1327 5' 9" (1.753 m)     Head Circumference --      Peak Flow --      Pain Score 01/31/19 1327 4     Pain Loc --      Pain Edu? --      Excl. in GC? --     Constitutional: Alert and oriented. Well appearing and in no acute distress. Eyes: Conjunctivae are normal. PERRL. EOMI. no pain with EOMs. ENT      Head: Normocephalic.  Left forehead vertical linear superficial laceration 4 cm, well approximated, no active bleeding, no foreign body, no erythema, mild tenderness.      Nose: No congestion Cardiovascular: Normal rate, regular rhythm. Grossly normal heart sounds.  Good peripheral circulation. Respiratory: Normal respiratory effort without tachypnea nor retractions. Breath sounds are clear and equal bilaterally. No wheezes, rales, rhonchi. Musculoskeletal: Steady gait.  No back tenderness. Neurologic:  Normal speech and language. No gross focal neurologic deficits are appreciated. Speech is normal. No gait instability. 5/5 strength to bilateral upper and lower extremities.  Negative pronator drift.  No ataxia.  No paresthesias. Skin:  Skin is warm, dry.  As above. Psychiatric: Mood and affect are normal. Speech and behavior are normal. Patient exhibits appropriate insight and judgment   ___________________________________________   LABS (all labs ordered are listed, but only abnormal results are displayed)  Labs Reviewed - No data to display   PROCEDURES Procedures   Procedure(s) performed:  Procedure explained and verbal consent obtained. Consent: Verbal consent obtained. Written consent not obtained. Risks and benefits: risks, benefits and alternatives were discussed Patient identity confirmed: verbally with patient and  hospital-assigned identification number  Consent given by: patient   Laceration Repair Location: Left forehead Length: 4 cm Foreign bodies: no foreign bodies Tendon involvement: none Nerve involvement: none Preparation: Patient was prepped and draped in the usual sterile fashion. Anesthesia: none Cleaned with betadine.  Irrigation solution: saline Irrigation method: jet lavage Amount of cleaning: copious Repaired with dermabond Patient tolerate well. Wound well approximated post repair.  Antibiotic ointment and dressing applied.  Wound care instructions provided.  Observe for any signs of infection or other problems.    Radiology CLINICAL DATA:  Fall with trauma to the head. Headache.  EXAM: CT HEAD WITHOUT CONTRAST  TECHNIQUE: Contiguous axial images were obtained from the base of the skull through the vertex without intravenous contrast.  COMPARISON:  02/29/2016  FINDINGS: Brain: No acute or traumatic finding. Mild age related atrophy. Mild chronic small-vessel change of the   hemispheric white matter. No sign of recent infarction, mass lesion, hemorrhage, hydrocephalus or extra-axial collection.  Vascular: There is atherosclerotic calcification of the major vessels at the base of the brain.  Skull: Negative  Sinuses/Orbits: Clear/normal  Other: None  IMPRESSION: No acute or traumatic finding. Mild age related atrophy and chronic small-vessel ischemic change of the white matter. Atherosclerotic calcification of the major vessels at the base of the brain.   Electronically Signed   By: Mark  Shogry M.D.   On: 01/31/2019 15:29  INITIAL IMPRESSION / ASSESSMENT AND PLAN / ED COURSE  Pertinent labs & imaging results that were available during my care of the patient were reviewed by me and considered in my medical decision making (see chart for details).  Well-appearing patient.  No acute distress.  No focal neurological deficits.  Mechanical fall  leading to left forehead laceration.  CT head evaluated, results as above per radiologist, no acute or traumatic finding.  Wound copiously cleaned and irrigated and repaired as above.  Patient tolerated well.  Tetanus immunization is up-to-date.  Encourage rest, fluids, Tylenol as needed, supportive care.  Discussed follow-up and return parameters.  Discussed follow up with Primary care physician this week. Discussed follow up and return parameters including no resolution or any worsening concerns. Patient verbalized understanding and agreed to plan.   ____________________________________________   FINAL CLINICAL IMPRESSION(S) / ED DIAGNOSES  Final diagnoses:  Fall, initial encounter  Facial laceration, initial encounter  Injury of head, initial encounter     ED Discharge Orders    None       Note: This dictation was prepared with Dragon dictation along with smaller phrase technology. Any transcriptional errors that result from this process are unintentional.         , , NP 01/31/19 1633  

## 2019-01-31 NOTE — Discharge Instructions (Signed)
Tylenol as needed. Rest. Drink plenty of fluids.   Follow up with your primary care physician this week as needed. Return to Urgent care for new or worsening concerns.

## 2019-02-04 DIAGNOSIS — S01112A Laceration without foreign body of left eyelid and periocular area, initial encounter: Secondary | ICD-10-CM | POA: Diagnosis not present

## 2019-02-04 DIAGNOSIS — J300 Vasomotor rhinitis: Secondary | ICD-10-CM | POA: Diagnosis not present

## 2019-02-04 DIAGNOSIS — Z87891 Personal history of nicotine dependence: Secondary | ICD-10-CM | POA: Diagnosis not present

## 2019-02-04 DIAGNOSIS — W01198A Fall on same level from slipping, tripping and stumbling with subsequent striking against other object, initial encounter: Secondary | ICD-10-CM | POA: Diagnosis not present

## 2019-02-04 DIAGNOSIS — Z87820 Personal history of traumatic brain injury: Secondary | ICD-10-CM | POA: Diagnosis not present

## 2019-02-04 DIAGNOSIS — R0789 Other chest pain: Secondary | ICD-10-CM | POA: Diagnosis not present

## 2019-02-04 DIAGNOSIS — R079 Chest pain, unspecified: Secondary | ICD-10-CM | POA: Diagnosis not present

## 2019-02-04 DIAGNOSIS — R05 Cough: Secondary | ICD-10-CM | POA: Diagnosis not present

## 2019-02-24 DIAGNOSIS — N289 Disorder of kidney and ureter, unspecified: Secondary | ICD-10-CM | POA: Diagnosis not present

## 2019-02-24 DIAGNOSIS — I959 Hypotension, unspecified: Secondary | ICD-10-CM | POA: Diagnosis not present

## 2019-02-24 DIAGNOSIS — N136 Pyonephrosis: Secondary | ICD-10-CM | POA: Diagnosis not present

## 2019-02-24 DIAGNOSIS — I080 Rheumatic disorders of both mitral and aortic valves: Secondary | ICD-10-CM | POA: Diagnosis not present

## 2019-02-24 DIAGNOSIS — R7881 Bacteremia: Secondary | ICD-10-CM | POA: Diagnosis not present

## 2019-02-24 DIAGNOSIS — N132 Hydronephrosis with renal and ureteral calculous obstruction: Secondary | ICD-10-CM | POA: Diagnosis not present

## 2019-02-24 DIAGNOSIS — D689 Coagulation defect, unspecified: Secondary | ICD-10-CM | POA: Diagnosis not present

## 2019-02-24 DIAGNOSIS — I272 Pulmonary hypertension, unspecified: Secondary | ICD-10-CM | POA: Diagnosis not present

## 2019-02-24 DIAGNOSIS — R4182 Altered mental status, unspecified: Secondary | ICD-10-CM | POA: Diagnosis not present

## 2019-02-24 DIAGNOSIS — J129 Viral pneumonia, unspecified: Secondary | ICD-10-CM | POA: Diagnosis not present

## 2019-02-24 DIAGNOSIS — B954 Other streptococcus as the cause of diseases classified elsewhere: Secondary | ICD-10-CM | POA: Diagnosis not present

## 2019-02-24 DIAGNOSIS — R579 Shock, unspecified: Secondary | ICD-10-CM | POA: Diagnosis not present

## 2019-02-24 DIAGNOSIS — R918 Other nonspecific abnormal finding of lung field: Secondary | ICD-10-CM | POA: Diagnosis not present

## 2019-02-24 DIAGNOSIS — N183 Chronic kidney disease, stage 3 unspecified: Secondary | ICD-10-CM | POA: Diagnosis not present

## 2019-02-24 DIAGNOSIS — N179 Acute kidney failure, unspecified: Secondary | ICD-10-CM | POA: Diagnosis not present

## 2019-02-24 DIAGNOSIS — R6521 Severe sepsis with septic shock: Secondary | ICD-10-CM | POA: Diagnosis not present

## 2019-02-24 DIAGNOSIS — R5081 Fever presenting with conditions classified elsewhere: Secondary | ICD-10-CM | POA: Diagnosis not present

## 2019-02-24 DIAGNOSIS — Z452 Encounter for adjustment and management of vascular access device: Secondary | ICD-10-CM | POA: Diagnosis not present

## 2019-02-24 DIAGNOSIS — N410 Acute prostatitis: Secondary | ICD-10-CM | POA: Diagnosis not present

## 2019-02-24 DIAGNOSIS — R0902 Hypoxemia: Secondary | ICD-10-CM | POA: Diagnosis not present

## 2019-02-24 DIAGNOSIS — N390 Urinary tract infection, site not specified: Secondary | ICD-10-CM | POA: Diagnosis not present

## 2019-02-24 DIAGNOSIS — A419 Sepsis, unspecified organism: Secondary | ICD-10-CM | POA: Diagnosis not present

## 2019-02-24 DIAGNOSIS — N400 Enlarged prostate without lower urinary tract symptoms: Secondary | ICD-10-CM | POA: Diagnosis not present

## 2019-02-24 DIAGNOSIS — N419 Inflammatory disease of prostate, unspecified: Secondary | ICD-10-CM | POA: Diagnosis not present

## 2019-02-24 DIAGNOSIS — N201 Calculus of ureter: Secondary | ICD-10-CM | POA: Diagnosis not present

## 2019-02-24 DIAGNOSIS — U071 COVID-19: Secondary | ICD-10-CM | POA: Diagnosis not present

## 2019-02-24 DIAGNOSIS — J9601 Acute respiratory failure with hypoxia: Secondary | ICD-10-CM | POA: Diagnosis not present

## 2019-02-24 DIAGNOSIS — N189 Chronic kidney disease, unspecified: Secondary | ICD-10-CM | POA: Diagnosis not present

## 2019-02-24 DIAGNOSIS — A408 Other streptococcal sepsis: Secondary | ICD-10-CM | POA: Diagnosis not present

## 2019-03-07 DIAGNOSIS — N138 Other obstructive and reflux uropathy: Secondary | ICD-10-CM | POA: Diagnosis not present

## 2019-03-07 DIAGNOSIS — N136 Pyonephrosis: Secondary | ICD-10-CM | POA: Diagnosis not present

## 2019-03-07 DIAGNOSIS — N401 Enlarged prostate with lower urinary tract symptoms: Secondary | ICD-10-CM | POA: Diagnosis not present

## 2019-03-07 DIAGNOSIS — U071 COVID-19: Secondary | ICD-10-CM | POA: Diagnosis not present

## 2019-03-07 DIAGNOSIS — I959 Hypotension, unspecified: Secondary | ICD-10-CM | POA: Diagnosis not present

## 2019-03-07 DIAGNOSIS — N183 Chronic kidney disease, stage 3 unspecified: Secondary | ICD-10-CM | POA: Diagnosis not present

## 2019-03-07 DIAGNOSIS — N200 Calculus of kidney: Secondary | ICD-10-CM | POA: Diagnosis not present

## 2019-03-07 DIAGNOSIS — A419 Sepsis, unspecified organism: Secondary | ICD-10-CM | POA: Diagnosis not present

## 2019-03-07 DIAGNOSIS — Z48816 Encounter for surgical aftercare following surgery on the genitourinary system: Secondary | ICD-10-CM | POA: Diagnosis not present

## 2019-03-08 DIAGNOSIS — R600 Localized edema: Secondary | ICD-10-CM | POA: Diagnosis not present

## 2019-03-12 DIAGNOSIS — N200 Calculus of kidney: Secondary | ICD-10-CM | POA: Diagnosis not present

## 2019-03-13 DIAGNOSIS — N401 Enlarged prostate with lower urinary tract symptoms: Secondary | ICD-10-CM | POA: Diagnosis not present

## 2019-03-13 DIAGNOSIS — Z48816 Encounter for surgical aftercare following surgery on the genitourinary system: Secondary | ICD-10-CM | POA: Diagnosis not present

## 2019-03-13 DIAGNOSIS — I959 Hypotension, unspecified: Secondary | ICD-10-CM | POA: Diagnosis not present

## 2019-03-13 DIAGNOSIS — U071 COVID-19: Secondary | ICD-10-CM | POA: Diagnosis not present

## 2019-03-13 DIAGNOSIS — A419 Sepsis, unspecified organism: Secondary | ICD-10-CM | POA: Diagnosis not present

## 2019-03-13 DIAGNOSIS — N138 Other obstructive and reflux uropathy: Secondary | ICD-10-CM | POA: Diagnosis not present

## 2019-03-13 DIAGNOSIS — N136 Pyonephrosis: Secondary | ICD-10-CM | POA: Diagnosis not present

## 2019-03-13 DIAGNOSIS — N183 Chronic kidney disease, stage 3 unspecified: Secondary | ICD-10-CM | POA: Diagnosis not present

## 2019-03-13 DIAGNOSIS — N200 Calculus of kidney: Secondary | ICD-10-CM | POA: Diagnosis not present

## 2019-03-14 DIAGNOSIS — R7402 Elevation of levels of lactic acid dehydrogenase (LDH): Secondary | ICD-10-CM | POA: Diagnosis not present

## 2019-03-14 DIAGNOSIS — R7989 Other specified abnormal findings of blood chemistry: Secondary | ICD-10-CM | POA: Diagnosis not present

## 2019-03-14 DIAGNOSIS — N200 Calculus of kidney: Secondary | ICD-10-CM | POA: Diagnosis not present

## 2019-03-14 DIAGNOSIS — D649 Anemia, unspecified: Secondary | ICD-10-CM | POA: Diagnosis not present

## 2019-03-14 DIAGNOSIS — E538 Deficiency of other specified B group vitamins: Secondary | ICD-10-CM | POA: Diagnosis not present

## 2019-03-14 DIAGNOSIS — N179 Acute kidney failure, unspecified: Secondary | ICD-10-CM | POA: Diagnosis not present

## 2019-03-14 DIAGNOSIS — J300 Vasomotor rhinitis: Secondary | ICD-10-CM | POA: Diagnosis not present

## 2019-03-18 DIAGNOSIS — Z0100 Encounter for examination of eyes and vision without abnormal findings: Secondary | ICD-10-CM | POA: Diagnosis not present

## 2019-03-20 DIAGNOSIS — J90 Pleural effusion, not elsewhere classified: Secondary | ICD-10-CM | POA: Diagnosis not present

## 2019-03-20 DIAGNOSIS — R188 Other ascites: Secondary | ICD-10-CM | POA: Diagnosis not present

## 2019-03-20 DIAGNOSIS — I959 Hypotension, unspecified: Secondary | ICD-10-CM | POA: Diagnosis not present

## 2019-03-20 DIAGNOSIS — J811 Chronic pulmonary edema: Secondary | ICD-10-CM | POA: Diagnosis not present

## 2019-03-20 DIAGNOSIS — R0902 Hypoxemia: Secondary | ICD-10-CM | POA: Diagnosis not present

## 2019-03-20 DIAGNOSIS — Z96611 Presence of right artificial shoulder joint: Secondary | ICD-10-CM | POA: Diagnosis not present

## 2019-03-20 DIAGNOSIS — J9601 Acute respiratory failure with hypoxia: Secondary | ICD-10-CM | POA: Diagnosis not present

## 2019-03-20 DIAGNOSIS — G8918 Other acute postprocedural pain: Secondary | ICD-10-CM | POA: Diagnosis not present

## 2019-03-20 DIAGNOSIS — R918 Other nonspecific abnormal finding of lung field: Secondary | ICD-10-CM | POA: Diagnosis not present

## 2019-03-20 DIAGNOSIS — Z87891 Personal history of nicotine dependence: Secondary | ICD-10-CM | POA: Diagnosis not present

## 2019-03-20 DIAGNOSIS — N202 Calculus of kidney with calculus of ureter: Secondary | ICD-10-CM | POA: Diagnosis not present

## 2019-03-20 DIAGNOSIS — U071 COVID-19: Secondary | ICD-10-CM | POA: Diagnosis not present

## 2019-03-20 DIAGNOSIS — Z96652 Presence of left artificial knee joint: Secondary | ICD-10-CM | POA: Diagnosis not present

## 2019-03-20 DIAGNOSIS — Z8616 Personal history of COVID-19: Secondary | ICD-10-CM | POA: Diagnosis not present

## 2019-03-20 DIAGNOSIS — J189 Pneumonia, unspecified organism: Secondary | ICD-10-CM | POA: Diagnosis not present

## 2019-03-20 DIAGNOSIS — A419 Sepsis, unspecified organism: Secondary | ICD-10-CM | POA: Diagnosis not present

## 2019-03-20 DIAGNOSIS — Z452 Encounter for adjustment and management of vascular access device: Secondary | ICD-10-CM | POA: Diagnosis not present

## 2019-03-20 DIAGNOSIS — R0689 Other abnormalities of breathing: Secondary | ICD-10-CM | POA: Diagnosis not present

## 2019-03-20 DIAGNOSIS — R0989 Other specified symptoms and signs involving the circulatory and respiratory systems: Secondary | ICD-10-CM | POA: Diagnosis not present

## 2019-03-20 DIAGNOSIS — N200 Calculus of kidney: Secondary | ICD-10-CM | POA: Diagnosis not present

## 2019-03-27 DIAGNOSIS — I959 Hypotension, unspecified: Secondary | ICD-10-CM | POA: Diagnosis not present

## 2019-03-27 DIAGNOSIS — N138 Other obstructive and reflux uropathy: Secondary | ICD-10-CM | POA: Diagnosis not present

## 2019-03-27 DIAGNOSIS — N401 Enlarged prostate with lower urinary tract symptoms: Secondary | ICD-10-CM | POA: Diagnosis not present

## 2019-03-27 DIAGNOSIS — Z8616 Personal history of COVID-19: Secondary | ICD-10-CM | POA: Diagnosis not present

## 2019-03-27 DIAGNOSIS — Z87891 Personal history of nicotine dependence: Secondary | ICD-10-CM | POA: Diagnosis not present

## 2019-03-27 DIAGNOSIS — Z48816 Encounter for surgical aftercare following surgery on the genitourinary system: Secondary | ICD-10-CM | POA: Diagnosis not present

## 2019-03-27 DIAGNOSIS — Z9981 Dependence on supplemental oxygen: Secondary | ICD-10-CM | POA: Diagnosis not present

## 2019-03-27 DIAGNOSIS — F411 Generalized anxiety disorder: Secondary | ICD-10-CM | POA: Diagnosis not present

## 2019-03-27 DIAGNOSIS — F319 Bipolar disorder, unspecified: Secondary | ICD-10-CM | POA: Diagnosis not present

## 2019-03-28 DIAGNOSIS — N200 Calculus of kidney: Secondary | ICD-10-CM | POA: Diagnosis not present

## 2019-03-28 DIAGNOSIS — J189 Pneumonia, unspecified organism: Secondary | ICD-10-CM | POA: Diagnosis not present

## 2019-03-28 DIAGNOSIS — Y95 Nosocomial condition: Secondary | ICD-10-CM | POA: Diagnosis not present

## 2019-03-28 DIAGNOSIS — D649 Anemia, unspecified: Secondary | ICD-10-CM | POA: Diagnosis not present

## 2019-03-28 DIAGNOSIS — G608 Other hereditary and idiopathic neuropathies: Secondary | ICD-10-CM | POA: Diagnosis not present

## 2019-03-28 DIAGNOSIS — I700 Atherosclerosis of aorta: Secondary | ICD-10-CM | POA: Diagnosis not present

## 2019-04-04 DIAGNOSIS — N401 Enlarged prostate with lower urinary tract symptoms: Secondary | ICD-10-CM | POA: Diagnosis not present

## 2019-04-04 DIAGNOSIS — Z8616 Personal history of COVID-19: Secondary | ICD-10-CM | POA: Diagnosis not present

## 2019-04-04 DIAGNOSIS — I959 Hypotension, unspecified: Secondary | ICD-10-CM | POA: Diagnosis not present

## 2019-04-04 DIAGNOSIS — F319 Bipolar disorder, unspecified: Secondary | ICD-10-CM | POA: Diagnosis not present

## 2019-04-04 DIAGNOSIS — Z9981 Dependence on supplemental oxygen: Secondary | ICD-10-CM | POA: Diagnosis not present

## 2019-04-04 DIAGNOSIS — F411 Generalized anxiety disorder: Secondary | ICD-10-CM | POA: Diagnosis not present

## 2019-04-04 DIAGNOSIS — Z87891 Personal history of nicotine dependence: Secondary | ICD-10-CM | POA: Diagnosis not present

## 2019-04-04 DIAGNOSIS — N138 Other obstructive and reflux uropathy: Secondary | ICD-10-CM | POA: Diagnosis not present

## 2019-04-04 DIAGNOSIS — Z48816 Encounter for surgical aftercare following surgery on the genitourinary system: Secondary | ICD-10-CM | POA: Diagnosis not present

## 2019-04-12 DIAGNOSIS — N138 Other obstructive and reflux uropathy: Secondary | ICD-10-CM | POA: Diagnosis not present

## 2019-04-12 DIAGNOSIS — F319 Bipolar disorder, unspecified: Secondary | ICD-10-CM | POA: Diagnosis not present

## 2019-04-12 DIAGNOSIS — F411 Generalized anxiety disorder: Secondary | ICD-10-CM | POA: Diagnosis not present

## 2019-04-12 DIAGNOSIS — Z9981 Dependence on supplemental oxygen: Secondary | ICD-10-CM | POA: Diagnosis not present

## 2019-04-12 DIAGNOSIS — Z48816 Encounter for surgical aftercare following surgery on the genitourinary system: Secondary | ICD-10-CM | POA: Diagnosis not present

## 2019-04-12 DIAGNOSIS — Z8616 Personal history of COVID-19: Secondary | ICD-10-CM | POA: Diagnosis not present

## 2019-04-12 DIAGNOSIS — Z87891 Personal history of nicotine dependence: Secondary | ICD-10-CM | POA: Diagnosis not present

## 2019-04-12 DIAGNOSIS — I959 Hypotension, unspecified: Secondary | ICD-10-CM | POA: Diagnosis not present

## 2019-04-12 DIAGNOSIS — N401 Enlarged prostate with lower urinary tract symptoms: Secondary | ICD-10-CM | POA: Diagnosis not present

## 2019-04-19 DIAGNOSIS — N200 Calculus of kidney: Secondary | ICD-10-CM | POA: Diagnosis not present

## 2019-04-19 DIAGNOSIS — G479 Sleep disorder, unspecified: Secondary | ICD-10-CM | POA: Diagnosis not present

## 2019-04-19 DIAGNOSIS — D72819 Decreased white blood cell count, unspecified: Secondary | ICD-10-CM | POA: Diagnosis not present

## 2019-04-19 DIAGNOSIS — M2489 Other specific joint derangement of other specified joint, not elsewhere classified: Secondary | ICD-10-CM | POA: Diagnosis not present

## 2019-04-19 DIAGNOSIS — E538 Deficiency of other specified B group vitamins: Secondary | ICD-10-CM | POA: Diagnosis not present

## 2019-04-19 DIAGNOSIS — D649 Anemia, unspecified: Secondary | ICD-10-CM | POA: Diagnosis not present

## 2019-04-19 DIAGNOSIS — G608 Other hereditary and idiopathic neuropathies: Secondary | ICD-10-CM | POA: Diagnosis not present

## 2019-04-19 DIAGNOSIS — J189 Pneumonia, unspecified organism: Secondary | ICD-10-CM | POA: Diagnosis not present

## 2019-04-19 DIAGNOSIS — M40204 Unspecified kyphosis, thoracic region: Secondary | ICD-10-CM | POA: Diagnosis not present

## 2019-04-24 DIAGNOSIS — R413 Other amnesia: Secondary | ICD-10-CM | POA: Diagnosis not present

## 2019-04-24 DIAGNOSIS — R2689 Other abnormalities of gait and mobility: Secondary | ICD-10-CM | POA: Diagnosis not present

## 2019-04-24 DIAGNOSIS — M6281 Muscle weakness (generalized): Secondary | ICD-10-CM | POA: Diagnosis not present

## 2019-04-24 DIAGNOSIS — G609 Hereditary and idiopathic neuropathy, unspecified: Secondary | ICD-10-CM | POA: Diagnosis not present

## 2019-05-02 DIAGNOSIS — J189 Pneumonia, unspecified organism: Secondary | ICD-10-CM | POA: Diagnosis not present

## 2019-05-21 DIAGNOSIS — Z87891 Personal history of nicotine dependence: Secondary | ICD-10-CM | POA: Diagnosis not present

## 2019-05-21 DIAGNOSIS — N400 Enlarged prostate without lower urinary tract symptoms: Secondary | ICD-10-CM | POA: Diagnosis not present

## 2019-05-21 DIAGNOSIS — N132 Hydronephrosis with renal and ureteral calculous obstruction: Secondary | ICD-10-CM | POA: Diagnosis not present

## 2019-05-21 DIAGNOSIS — N136 Pyonephrosis: Secondary | ICD-10-CM | POA: Diagnosis not present

## 2019-05-21 DIAGNOSIS — N529 Male erectile dysfunction, unspecified: Secondary | ICD-10-CM | POA: Diagnosis not present

## 2019-05-21 DIAGNOSIS — R319 Hematuria, unspecified: Secondary | ICD-10-CM | POA: Diagnosis not present

## 2019-05-21 DIAGNOSIS — N528 Other male erectile dysfunction: Secondary | ICD-10-CM | POA: Diagnosis not present

## 2019-06-04 DIAGNOSIS — N200 Calculus of kidney: Secondary | ICD-10-CM | POA: Diagnosis not present

## 2019-06-18 DIAGNOSIS — N200 Calculus of kidney: Secondary | ICD-10-CM | POA: Diagnosis not present

## 2019-06-25 DIAGNOSIS — Z09 Encounter for follow-up examination after completed treatment for conditions other than malignant neoplasm: Secondary | ICD-10-CM | POA: Diagnosis not present

## 2019-06-25 DIAGNOSIS — Z87828 Personal history of other (healed) physical injury and trauma: Secondary | ICD-10-CM | POA: Diagnosis not present

## 2019-08-15 DIAGNOSIS — M1811 Unilateral primary osteoarthritis of first carpometacarpal joint, right hand: Secondary | ICD-10-CM | POA: Diagnosis not present

## 2019-08-15 DIAGNOSIS — I8393 Asymptomatic varicose veins of bilateral lower extremities: Secondary | ICD-10-CM | POA: Diagnosis not present

## 2019-09-10 DIAGNOSIS — H353131 Nonexudative age-related macular degeneration, bilateral, early dry stage: Secondary | ICD-10-CM | POA: Diagnosis not present

## 2019-09-10 DIAGNOSIS — H2510 Age-related nuclear cataract, unspecified eye: Secondary | ICD-10-CM | POA: Diagnosis not present

## 2019-10-09 DIAGNOSIS — H52213 Irregular astigmatism, bilateral: Secondary | ICD-10-CM | POA: Diagnosis not present

## 2019-10-09 DIAGNOSIS — H25013 Cortical age-related cataract, bilateral: Secondary | ICD-10-CM | POA: Diagnosis not present

## 2019-10-09 DIAGNOSIS — H2513 Age-related nuclear cataract, bilateral: Secondary | ICD-10-CM | POA: Diagnosis not present

## 2019-10-09 DIAGNOSIS — H35363 Drusen (degenerative) of macula, bilateral: Secondary | ICD-10-CM | POA: Diagnosis not present

## 2019-10-09 DIAGNOSIS — H2511 Age-related nuclear cataract, right eye: Secondary | ICD-10-CM | POA: Diagnosis not present

## 2019-10-09 DIAGNOSIS — H353132 Nonexudative age-related macular degeneration, bilateral, intermediate dry stage: Secondary | ICD-10-CM | POA: Diagnosis not present

## 2019-10-09 DIAGNOSIS — H40013 Open angle with borderline findings, low risk, bilateral: Secondary | ICD-10-CM | POA: Diagnosis not present

## 2019-10-18 DIAGNOSIS — E538 Deficiency of other specified B group vitamins: Secondary | ICD-10-CM | POA: Diagnosis not present

## 2019-10-18 DIAGNOSIS — E559 Vitamin D deficiency, unspecified: Secondary | ICD-10-CM | POA: Diagnosis not present

## 2019-10-18 DIAGNOSIS — N5201 Erectile dysfunction due to arterial insufficiency: Secondary | ICD-10-CM | POA: Diagnosis not present

## 2019-10-18 DIAGNOSIS — I700 Atherosclerosis of aorta: Secondary | ICD-10-CM | POA: Diagnosis not present

## 2019-10-18 DIAGNOSIS — N400 Enlarged prostate without lower urinary tract symptoms: Secondary | ICD-10-CM | POA: Diagnosis not present

## 2019-10-18 DIAGNOSIS — Z0000 Encounter for general adult medical examination without abnormal findings: Secondary | ICD-10-CM | POA: Diagnosis not present

## 2019-10-18 DIAGNOSIS — N200 Calculus of kidney: Secondary | ICD-10-CM | POA: Diagnosis not present

## 2019-10-18 DIAGNOSIS — D649 Anemia, unspecified: Secondary | ICD-10-CM | POA: Diagnosis not present

## 2019-10-18 DIAGNOSIS — Z79899 Other long term (current) drug therapy: Secondary | ICD-10-CM | POA: Diagnosis not present

## 2019-10-18 DIAGNOSIS — M1712 Unilateral primary osteoarthritis, left knee: Secondary | ICD-10-CM | POA: Diagnosis not present

## 2019-10-18 DIAGNOSIS — G44229 Chronic tension-type headache, not intractable: Secondary | ICD-10-CM | POA: Diagnosis not present

## 2019-10-22 DIAGNOSIS — H25811 Combined forms of age-related cataract, right eye: Secondary | ICD-10-CM | POA: Diagnosis not present

## 2019-10-22 DIAGNOSIS — H2511 Age-related nuclear cataract, right eye: Secondary | ICD-10-CM | POA: Diagnosis not present

## 2019-10-31 DIAGNOSIS — H2511 Age-related nuclear cataract, right eye: Secondary | ICD-10-CM | POA: Diagnosis not present

## 2019-11-18 DIAGNOSIS — H2512 Age-related nuclear cataract, left eye: Secondary | ICD-10-CM | POA: Diagnosis not present

## 2019-11-18 DIAGNOSIS — H25012 Cortical age-related cataract, left eye: Secondary | ICD-10-CM | POA: Diagnosis not present

## 2019-12-30 DIAGNOSIS — M7022 Olecranon bursitis, left elbow: Secondary | ICD-10-CM | POA: Diagnosis not present

## 2019-12-31 DIAGNOSIS — H25012 Cortical age-related cataract, left eye: Secondary | ICD-10-CM | POA: Diagnosis not present

## 2019-12-31 DIAGNOSIS — H2512 Age-related nuclear cataract, left eye: Secondary | ICD-10-CM | POA: Diagnosis not present

## 2019-12-31 DIAGNOSIS — H25812 Combined forms of age-related cataract, left eye: Secondary | ICD-10-CM | POA: Diagnosis not present

## 2020-01-01 DIAGNOSIS — S51012A Laceration without foreign body of left elbow, initial encounter: Secondary | ICD-10-CM | POA: Diagnosis not present

## 2020-01-01 DIAGNOSIS — M7022 Olecranon bursitis, left elbow: Secondary | ICD-10-CM | POA: Diagnosis not present

## 2020-01-06 DIAGNOSIS — M7022 Olecranon bursitis, left elbow: Secondary | ICD-10-CM | POA: Diagnosis not present

## 2020-01-07 DIAGNOSIS — H2512 Age-related nuclear cataract, left eye: Secondary | ICD-10-CM | POA: Diagnosis not present

## 2020-01-13 ENCOUNTER — Other Ambulatory Visit
Admission: RE | Admit: 2020-01-13 | Discharge: 2020-01-13 | Disposition: A | Discharge status: Home / Self Care | Payer: Medicare HMO | Source: Ambulatory Visit | Attending: Student | Admitting: Student

## 2020-01-13 DIAGNOSIS — M7022 Olecranon bursitis, left elbow: Secondary | ICD-10-CM | POA: Diagnosis not present

## 2020-01-13 DIAGNOSIS — S51012D Laceration without foreign body of left elbow, subsequent encounter: Secondary | ICD-10-CM | POA: Diagnosis not present

## 2020-01-13 LAB — SYNOVIAL CELL COUNT + DIFF, W/ CRYSTALS
Crystals, Fluid: NONE SEEN
Eosinophils-Synovial: 0 %
Lymphocytes-Synovial Fld: 69 %
Monocyte-Macrophage-Synovial Fluid: 28 %
Neutrophil, Synovial: 3 %
WBC, Synovial: 1126 /mm3 — ABNORMAL HIGH (ref 0–200)

## 2020-01-21 DIAGNOSIS — N2889 Other specified disorders of kidney and ureter: Secondary | ICD-10-CM | POA: Diagnosis not present

## 2020-01-21 DIAGNOSIS — N200 Calculus of kidney: Secondary | ICD-10-CM | POA: Diagnosis not present

## 2020-01-21 DIAGNOSIS — N132 Hydronephrosis with renal and ureteral calculous obstruction: Secondary | ICD-10-CM | POA: Diagnosis not present

## 2020-01-22 DIAGNOSIS — N200 Calculus of kidney: Secondary | ICD-10-CM | POA: Diagnosis not present

## 2020-02-04 DIAGNOSIS — Z961 Presence of intraocular lens: Secondary | ICD-10-CM | POA: Diagnosis not present

## 2020-02-07 DIAGNOSIS — N200 Calculus of kidney: Secondary | ICD-10-CM | POA: Diagnosis not present

## 2020-02-10 DIAGNOSIS — N200 Calculus of kidney: Secondary | ICD-10-CM | POA: Diagnosis not present

## 2020-02-10 DIAGNOSIS — N400 Enlarged prostate without lower urinary tract symptoms: Secondary | ICD-10-CM | POA: Diagnosis not present

## 2020-02-10 DIAGNOSIS — F319 Bipolar disorder, unspecified: Secondary | ICD-10-CM | POA: Diagnosis not present

## 2020-02-10 DIAGNOSIS — Z87891 Personal history of nicotine dependence: Secondary | ICD-10-CM | POA: Diagnosis not present

## 2020-02-10 DIAGNOSIS — Z96652 Presence of left artificial knee joint: Secondary | ICD-10-CM | POA: Diagnosis not present

## 2020-02-10 DIAGNOSIS — G629 Polyneuropathy, unspecified: Secondary | ICD-10-CM | POA: Diagnosis not present

## 2020-02-10 DIAGNOSIS — F411 Generalized anxiety disorder: Secondary | ICD-10-CM | POA: Diagnosis not present

## 2020-02-10 DIAGNOSIS — N210 Calculus in bladder: Secondary | ICD-10-CM | POA: Diagnosis not present

## 2020-02-10 DIAGNOSIS — N529 Male erectile dysfunction, unspecified: Secondary | ICD-10-CM | POA: Diagnosis not present

## 2020-02-18 DIAGNOSIS — Z466 Encounter for fitting and adjustment of urinary device: Secondary | ICD-10-CM | POA: Diagnosis not present

## 2020-02-18 DIAGNOSIS — G608 Other hereditary and idiopathic neuropathies: Secondary | ICD-10-CM | POA: Diagnosis not present

## 2020-02-18 DIAGNOSIS — M19042 Primary osteoarthritis, left hand: Secondary | ICD-10-CM | POA: Diagnosis not present

## 2020-02-18 DIAGNOSIS — M19041 Primary osteoarthritis, right hand: Secondary | ICD-10-CM | POA: Diagnosis not present

## 2020-02-18 DIAGNOSIS — M545 Low back pain, unspecified: Secondary | ICD-10-CM | POA: Diagnosis not present

## 2020-02-18 DIAGNOSIS — G8929 Other chronic pain: Secondary | ICD-10-CM | POA: Diagnosis not present

## 2020-03-07 DIAGNOSIS — N401 Enlarged prostate with lower urinary tract symptoms: Secondary | ICD-10-CM | POA: Diagnosis not present

## 2020-03-07 DIAGNOSIS — S065X0A Traumatic subdural hemorrhage without loss of consciousness, initial encounter: Secondary | ICD-10-CM | POA: Diagnosis not present

## 2020-03-07 DIAGNOSIS — S3993XA Unspecified injury of pelvis, initial encounter: Secondary | ICD-10-CM | POA: Diagnosis not present

## 2020-03-07 DIAGNOSIS — S199XXA Unspecified injury of neck, initial encounter: Secondary | ICD-10-CM | POA: Diagnosis not present

## 2020-03-07 DIAGNOSIS — F319 Bipolar disorder, unspecified: Secondary | ICD-10-CM | POA: Diagnosis not present

## 2020-03-07 DIAGNOSIS — F411 Generalized anxiety disorder: Secondary | ICD-10-CM | POA: Diagnosis not present

## 2020-03-07 DIAGNOSIS — M4314 Spondylolisthesis, thoracic region: Secondary | ICD-10-CM | POA: Diagnosis not present

## 2020-03-07 DIAGNOSIS — S0240DA Maxillary fracture, left side, initial encounter for closed fracture: Secondary | ICD-10-CM | POA: Diagnosis not present

## 2020-03-07 DIAGNOSIS — S0240DD Maxillary fracture, left side, subsequent encounter for fracture with routine healing: Secondary | ICD-10-CM | POA: Diagnosis not present

## 2020-03-07 DIAGNOSIS — N138 Other obstructive and reflux uropathy: Secondary | ICD-10-CM | POA: Diagnosis not present

## 2020-03-07 DIAGNOSIS — S0232XA Fracture of orbital floor, left side, initial encounter for closed fracture: Secondary | ICD-10-CM | POA: Diagnosis not present

## 2020-03-07 DIAGNOSIS — J439 Emphysema, unspecified: Secondary | ICD-10-CM | POA: Diagnosis not present

## 2020-03-07 DIAGNOSIS — S0292XA Unspecified fracture of facial bones, initial encounter for closed fracture: Secondary | ICD-10-CM | POA: Diagnosis not present

## 2020-03-07 DIAGNOSIS — Z20822 Contact with and (suspected) exposure to covid-19: Secondary | ICD-10-CM | POA: Diagnosis not present

## 2020-03-07 DIAGNOSIS — W19XXXA Unspecified fall, initial encounter: Secondary | ICD-10-CM | POA: Diagnosis not present

## 2020-03-07 DIAGNOSIS — S1985XA Other specified injuries of pharynx and cervical esophagus, initial encounter: Secondary | ICD-10-CM | POA: Diagnosis not present

## 2020-03-07 DIAGNOSIS — T797XXA Traumatic subcutaneous emphysema, initial encounter: Secondary | ICD-10-CM | POA: Diagnosis not present

## 2020-03-07 DIAGNOSIS — R229 Localized swelling, mass and lump, unspecified: Secondary | ICD-10-CM | POA: Diagnosis not present

## 2020-03-07 DIAGNOSIS — S0292XD Unspecified fracture of facial bones, subsequent encounter for fracture with routine healing: Secondary | ICD-10-CM | POA: Diagnosis not present

## 2020-03-07 DIAGNOSIS — S299XXA Unspecified injury of thorax, initial encounter: Secondary | ICD-10-CM | POA: Diagnosis not present

## 2020-03-07 DIAGNOSIS — M542 Cervicalgia: Secondary | ICD-10-CM | POA: Diagnosis not present

## 2020-03-26 ENCOUNTER — Other Ambulatory Visit (HOSPITAL_COMMUNITY): Payer: Self-pay | Admitting: Physical Medicine and Rehabilitation

## 2020-03-26 ENCOUNTER — Other Ambulatory Visit: Payer: Self-pay | Admitting: Physical Medicine and Rehabilitation

## 2020-03-26 DIAGNOSIS — M5416 Radiculopathy, lumbar region: Secondary | ICD-10-CM | POA: Diagnosis not present

## 2020-03-26 DIAGNOSIS — M5441 Lumbago with sciatica, right side: Secondary | ICD-10-CM

## 2020-03-26 DIAGNOSIS — M5442 Lumbago with sciatica, left side: Secondary | ICD-10-CM

## 2020-03-26 DIAGNOSIS — M48062 Spinal stenosis, lumbar region with neurogenic claudication: Secondary | ICD-10-CM | POA: Diagnosis not present

## 2020-03-26 DIAGNOSIS — R2689 Other abnormalities of gait and mobility: Secondary | ICD-10-CM | POA: Diagnosis not present

## 2020-03-26 DIAGNOSIS — M5136 Other intervertebral disc degeneration, lumbar region: Secondary | ICD-10-CM | POA: Diagnosis not present

## 2020-04-07 ENCOUNTER — Ambulatory Visit: Payer: Medicare HMO

## 2020-04-17 DIAGNOSIS — N390 Urinary tract infection, site not specified: Secondary | ICD-10-CM | POA: Diagnosis not present

## 2020-04-21 ENCOUNTER — Other Ambulatory Visit: Payer: Self-pay

## 2020-04-21 ENCOUNTER — Ambulatory Visit
Admission: RE | Admit: 2020-04-21 | Discharge: 2020-04-21 | Disposition: A | Discharge status: Home / Self Care | Payer: Medicare HMO | Source: Ambulatory Visit | Attending: Physical Medicine and Rehabilitation | Admitting: Physical Medicine and Rehabilitation

## 2020-04-21 DIAGNOSIS — M5441 Lumbago with sciatica, right side: Secondary | ICD-10-CM | POA: Insufficient documentation

## 2020-04-21 DIAGNOSIS — M545 Low back pain, unspecified: Secondary | ICD-10-CM | POA: Diagnosis not present

## 2020-04-21 DIAGNOSIS — J029 Acute pharyngitis, unspecified: Secondary | ICD-10-CM | POA: Diagnosis not present

## 2020-04-21 DIAGNOSIS — M5442 Lumbago with sciatica, left side: Secondary | ICD-10-CM | POA: Diagnosis not present

## 2020-04-21 DIAGNOSIS — J069 Acute upper respiratory infection, unspecified: Secondary | ICD-10-CM | POA: Diagnosis not present

## 2020-04-24 DIAGNOSIS — M48062 Spinal stenosis, lumbar region with neurogenic claudication: Secondary | ICD-10-CM | POA: Diagnosis not present

## 2020-04-24 DIAGNOSIS — M5416 Radiculopathy, lumbar region: Secondary | ICD-10-CM | POA: Diagnosis not present

## 2020-04-24 DIAGNOSIS — M5136 Other intervertebral disc degeneration, lumbar region: Secondary | ICD-10-CM | POA: Diagnosis not present

## 2020-04-27 DIAGNOSIS — M6281 Muscle weakness (generalized): Secondary | ICD-10-CM | POA: Diagnosis not present

## 2020-04-27 DIAGNOSIS — R2689 Other abnormalities of gait and mobility: Secondary | ICD-10-CM | POA: Diagnosis not present

## 2020-04-27 DIAGNOSIS — G609 Hereditary and idiopathic neuropathy, unspecified: Secondary | ICD-10-CM | POA: Diagnosis not present

## 2020-04-28 DIAGNOSIS — I700 Atherosclerosis of aorta: Secondary | ICD-10-CM | POA: Diagnosis not present

## 2020-04-28 DIAGNOSIS — G629 Polyneuropathy, unspecified: Secondary | ICD-10-CM | POA: Diagnosis not present

## 2020-04-28 DIAGNOSIS — N529 Male erectile dysfunction, unspecified: Secondary | ICD-10-CM | POA: Diagnosis not present

## 2020-04-28 DIAGNOSIS — N200 Calculus of kidney: Secondary | ICD-10-CM | POA: Diagnosis not present

## 2020-04-28 DIAGNOSIS — D61818 Other pancytopenia: Secondary | ICD-10-CM | POA: Diagnosis not present

## 2020-04-28 DIAGNOSIS — G3184 Mild cognitive impairment, so stated: Secondary | ICD-10-CM | POA: Diagnosis not present

## 2020-04-28 DIAGNOSIS — E559 Vitamin D deficiency, unspecified: Secondary | ICD-10-CM | POA: Diagnosis not present

## 2020-04-28 DIAGNOSIS — E538 Deficiency of other specified B group vitamins: Secondary | ICD-10-CM | POA: Diagnosis not present

## 2020-04-28 DIAGNOSIS — N401 Enlarged prostate with lower urinary tract symptoms: Secondary | ICD-10-CM | POA: Diagnosis not present

## 2020-04-29 DIAGNOSIS — M5416 Radiculopathy, lumbar region: Secondary | ICD-10-CM | POA: Diagnosis not present

## 2020-04-29 DIAGNOSIS — M5136 Other intervertebral disc degeneration, lumbar region: Secondary | ICD-10-CM | POA: Diagnosis not present

## 2020-04-29 DIAGNOSIS — M48062 Spinal stenosis, lumbar region with neurogenic claudication: Secondary | ICD-10-CM | POA: Diagnosis not present

## 2020-05-05 ENCOUNTER — Other Ambulatory Visit: Payer: Self-pay

## 2020-05-05 ENCOUNTER — Ambulatory Visit: Payer: Medicare HMO | Attending: Neurology | Admitting: Physical Therapy

## 2020-05-05 DIAGNOSIS — R2689 Other abnormalities of gait and mobility: Secondary | ICD-10-CM | POA: Insufficient documentation

## 2020-05-05 DIAGNOSIS — M79604 Pain in right leg: Secondary | ICD-10-CM | POA: Insufficient documentation

## 2020-05-05 DIAGNOSIS — M79605 Pain in left leg: Secondary | ICD-10-CM | POA: Insufficient documentation

## 2020-05-05 DIAGNOSIS — Z9181 History of falling: Secondary | ICD-10-CM | POA: Insufficient documentation

## 2020-05-05 DIAGNOSIS — M6281 Muscle weakness (generalized): Secondary | ICD-10-CM | POA: Insufficient documentation

## 2020-05-07 ENCOUNTER — Other Ambulatory Visit: Payer: Self-pay

## 2020-05-07 ENCOUNTER — Ambulatory Visit: Payer: Medicare HMO | Admitting: Physical Therapy

## 2020-05-07 DIAGNOSIS — Z9181 History of falling: Secondary | ICD-10-CM

## 2020-05-07 DIAGNOSIS — M79605 Pain in left leg: Secondary | ICD-10-CM | POA: Diagnosis not present

## 2020-05-07 DIAGNOSIS — R2689 Other abnormalities of gait and mobility: Secondary | ICD-10-CM

## 2020-05-07 DIAGNOSIS — M6281 Muscle weakness (generalized): Secondary | ICD-10-CM | POA: Diagnosis not present

## 2020-05-07 DIAGNOSIS — M79604 Pain in right leg: Secondary | ICD-10-CM | POA: Diagnosis not present

## 2020-05-08 NOTE — Therapy (Addendum)
[place] [facility] [address] [place], [place], [address] Phone: [phone]   Fax:  [phone]  Physical Therapy Evaluation  Patient Details  Name: David [name] MRN: [id number] Date of Birth: [birth year] Referring Provider (PT): Dr. Manuella Ghazi   Encounter Date: 05/05/2020     PT End of Session - 05/08/20 1417    Visit Number 1    Number of Visits 9    Date for PT Re-Evaluation 06/02/20    Authorization - Visit Number 1    Authorization - Number of Visits 10    PT Start Time 0816    PT Stop Time 0910    PT Time Calculation (min) 54 min    Equipment Utilized During Treatment Gait belt    Activity Tolerance Patient tolerated treatment well    Behavior During Therapy [facility] for tasks assessed/performed           Past Medical History:  Diagnosis Date  . Allergic rhinitis   . Anemia   . Bipolar disorder ([place])   . Elevated PSA    had biopsy at [facility] around 2009-2010 negative for malignancy  . Erectile dysfunction   . GERD (gastroesophageal reflux disease)   . Leucopenia   . Osteoarthritis    in hands  . Prostatitis   . Sigmoid polyp     Past Surgical History:  Procedure Laterality Date  . COLONOSCOPY  10/20/12  . HERNIA REPAIR    . Left knee replacement    . Right shoulder replacement      There were no vitals filed for this visit.     Subjective Assessment - 05/16/20 1319    Subjective Pt. referred to PT due to imbalance and fall with injury in January 2022.  Pt. known well to PT clinic.  Pt. reports 0/10 back pain at this time.  Pt. has chronic h/o neuropathy in B feet, esp. at night.    Pertinent History Pt. active with yard work and member of [employer].    Limitations Walking;House hold activities    Patient Stated Goals Increase LE strengthening/ independence with walking and balance tasks.    Currently in Pain? No/denies           MMT: LE: Hip flexion L 4+/5, R 4/5 Knee extension 5/5 B Knee flexion 5/5  B DF 5/5 B PF 5/5 B Hip abduction 5/5 B Hip adduction: 5/5 B Hip extension  5/5 B  Special Tests: Heel raise test - R 16 decreased range, L 25 decreased range with WB  Berg: 44/56     Objective measurements completed on examination: See above findings.    See HEP  Star ex. With CGA and mirror feedback for cuing.   Instruction in use of SPC.       PT Long Term Goals - 05/17/20 1554      PT LONG TERM GOAL #1   Title Pt. will complete TUG in <10 sec. to improve dynamic gait/ decrease fall risk.    Baseline TUG: 10.8 sec.    Time 4    Period Weeks    Status New    Target Date 06/02/20      PT LONG TERM GOAL #2   Title Pt. will demonstrate a score of at least >50/56 on Berg to indicate decrease in fall risk.    Baseline IE: 44/56    Time 4    Period Weeks    Status New    Target Date 06/02/20  PT LONG TERM GOAL #3   Title Pt will score 66 on FOTO to indicate an increase in functional mobility    Baseline IE: 56    Time 4    Period Weeks    Status New    Target Date 06/02/20      PT LONG TERM GOAL #4   Title Pt. will ambulate with consistent 2-point gait pattern with use of SPC on all surfaces to decrease fall risk/ improve safety.    Baseline Pt. benefits from use of SPC.  Pt. currently ambulates with no assistive device.    Time 4    Period Weeks    Status New    Target Date 06/02/20      PT LONG TERM GOAL #5   Title --    Baseline --    Time --    Period --    Status --                  Plan - 05/17/20 1545    Clinical Impression Statement Pt is a pleasant 80 y/o male presenting to therapy with complaints of decreased balance and neuropathy in B LE.  Pt FOTO score 56, with goal of 66, indicating slight decrease in functional mobility. Pt MMT revealed LE strength grossly 5/5 except for hip flexion: L 4+/5 and R 4/5. Pt also demonstrates poor B plantarflexor endurance with heel raise test, performing 16 heel raises on R and 25 on L but with  significantly limited ROM. Pt Berg score 44/56 indicating pt is at risk for falls.  TUG: 10.8 sec. after 3 attempts.  Gait assessment revealed pt with inconsistent BOS, increased adduction on L LE during swing phase, decreased B heel strike, and decreased arm swing on R compared to L. Pt also with R lateral lean during gait and L hip drop.  No LOB during gait assessment but difficulty with turning (pt. tends to turn with head/upper body, not feet).  Pt will continue to benefit from further skilled therapy to increase LE strength and decrease fall risk.    Personal Factors and Comorbidities Age    Examination-Activity Limitations Locomotion Level    Examination-Participation Restrictions Cleaning;Community Activity;Yard Work    [employer]chant [employer] officer Evolving/Moderate complexity    Clinical Decision Making Moderate    Rehab Potential Good    PT Frequency 2x / week    PT Duration 4 weeks    PT Treatment/Interventions ADLs/Self Care Home Management;Moist Heat;Cryotherapy;Electrical Stimulation;DME Instruction;Gait training;Stair training;Functional mobility training;Therapeutic activities;Therapeutic exercise;Balance training;Neuromuscular re-education;Patient/family education;Orthotic Fit/Training;Manual techniques;Compression bandaging;Energy conservation    PT Next Visit Plan Dynamic balance/ gait activites.    PT Home Exercise Plan PT reviewed progressive LE strengthening/ balance tasks.    Consulted and Agree with Plan of Care Patient           Patient will benefit from skilled therapeutic intervention in order to improve the following deficits and impairments:  Abnormal gait,Improper body mechanics,Pain,Decreased coordination,Decreased mobility,Postural dysfunction,Decreased activity tolerance,Decreased endurance,Decreased range of motion,Decreased strength,Decreased balance,Difficulty walking,Impaired flexibility  Visit Diagnosis: Balance disorder  Muscle weakness  (generalized)  History of falling     Problem List Patient Active Problem List   Diagnosis Date Noted  . B12 deficiency 11/05/2018  . Abnormal SPEP 10/19/2018  . Normocytic anemia 10/19/2018  . Leukopenia 10/19/2018  . Lymphopenia 06/07/2016  . Abnormal weight loss 06/07/2016  . Community acquired pneumonia of left lower lobe of lung   . Pneumonia of left lower lobe due to Streptococcus  pneumoniae ([place])   . Acute respiratory failure ([place])   . Bipolar disorder with moderate depression ([place]) 03/01/2016  . Hypotension 02/29/2016  . IDA (iron deficiency anemia) 06/19/2014  . Leucopenia 06/19/2014   Pura Spice, PT, DPT # [phone] 05/17/2020, 4:00 PM  [facility] [address] [place], [place], [address] Phone: [phone]   Fax:  [phone]  Name: SEVEN DOLLENS MRN: [id number] Date of Birth: [birth year]

## 2020-05-11 DIAGNOSIS — N200 Calculus of kidney: Secondary | ICD-10-CM | POA: Diagnosis not present

## 2020-05-12 ENCOUNTER — Other Ambulatory Visit: Payer: Self-pay

## 2020-05-12 ENCOUNTER — Ambulatory Visit: Payer: Medicare HMO | Admitting: Physical Therapy

## 2020-05-12 DIAGNOSIS — R2689 Other abnormalities of gait and mobility: Secondary | ICD-10-CM

## 2020-05-12 DIAGNOSIS — M79605 Pain in left leg: Secondary | ICD-10-CM

## 2020-05-12 DIAGNOSIS — Z9181 History of falling: Secondary | ICD-10-CM

## 2020-05-12 DIAGNOSIS — M79604 Pain in right leg: Secondary | ICD-10-CM

## 2020-05-12 DIAGNOSIS — M6281 Muscle weakness (generalized): Secondary | ICD-10-CM | POA: Diagnosis not present

## 2020-05-14 ENCOUNTER — Other Ambulatory Visit: Payer: Self-pay

## 2020-05-14 ENCOUNTER — Ambulatory Visit: Payer: Medicare HMO | Admitting: Physical Therapy

## 2020-05-14 DIAGNOSIS — Z9181 History of falling: Secondary | ICD-10-CM

## 2020-05-14 DIAGNOSIS — M79605 Pain in left leg: Secondary | ICD-10-CM

## 2020-05-14 DIAGNOSIS — R2689 Other abnormalities of gait and mobility: Secondary | ICD-10-CM | POA: Diagnosis not present

## 2020-05-14 DIAGNOSIS — M79604 Pain in right leg: Secondary | ICD-10-CM | POA: Diagnosis not present

## 2020-05-14 DIAGNOSIS — M6281 Muscle weakness (generalized): Secondary | ICD-10-CM | POA: Diagnosis not present

## 2020-05-15 NOTE — Therapy (Addendum)
[facility] [address] [place], [place], [address] Phone: [phone]   Fax:  [phone]  Physical Therapy Treatment  Patient Details  Name: David [name] MRN: [id number] Date of Birth: [birth year] Referring Provider (PT): Dr. Manuella Ghazi   Encounter Date: 05/07/2020     PT End of Session - 05/15/20 1025    Visit Number 2    Number of Visits 9    Date for PT Re-Evaluation 06/02/20    Authorization - Visit Number 2    Authorization - Number of Visits 10    PT Start Time 0813    PT Stop Time 0902    PT Time Calculation (min) 49 min    Equipment Utilized During Treatment Gait belt    Activity Tolerance Patient tolerated treatment well    Behavior During Therapy [facility] for tasks assessed/performed            Past Medical History:  Diagnosis Date  . Allergic rhinitis   . Anemia   . Bipolar disorder ([place])   . Elevated PSA    had biopsy at [facility] around 2009-2010 negative for malignancy  . Erectile dysfunction   . GERD (gastroesophageal reflux disease)   . Leucopenia   . Osteoarthritis    in hands  . Prostatitis   . Sigmoid polyp     Past Surgical History:  Procedure Laterality Date  . COLONOSCOPY  10/20/12  . HERNIA REPAIR    . Left knee replacement    . Right shoulder replacement      There were no vitals filed for this visit.   Subjective Assessment - 05/17/20 1612    Subjective Pt. reports no new complaints.  Pt. states he has been busy with yard work/ reseeding grass.    Pertinent History Pt. active with yard work and member of [employer].    Limitations Walking;House hold activities    Patient Stated Goals Increase LE strengthening/ independence with walking and balance tasks.    Currently in Pain? No/denies            There.ex.:  Reviewed HEP (handouts)  Scifit L6 10 min. B UE/LE (consistent cadence/ no rest breaks)  Neuro.mm.:  Sit to stands 5x from gray chair with no UE assist.  Airex step  ups/ overs/ Tandem Airex lateral walking (UE assist required)- CGA for safety  Walking in //-bars with green hurdles working on maintaining BOS/ consistent recip. Step pattern.   Walking in hallway/ outside with use of SPC.  Marked improvement in gait pattern/ BOS with SPC and decrease LOB during turning.        PT Long Term Goals - 05/17/20 1554      PT LONG TERM GOAL #1   Title Pt. will complete TUG in <10 sec. to improve dynamic gait/ decrease fall risk.    Baseline TUG: 10.8 sec.    Time 4    Period Weeks    Status New    Target Date 06/02/20      PT LONG TERM GOAL #2   Title Pt. will demonstrate a score of at least >50/56 on Berg to indicate decrease in fall risk.    Baseline IE: 44/56    Time 4    Period Weeks    Status New    Target Date 06/02/20      PT LONG TERM GOAL #3   Title Pt will score 66 on FOTO to indicate an increase in functional mobility  Baseline IE: 56    Time 4    Period Weeks    Status New    Target Date 06/02/20      PT LONG TERM GOAL #4   Title Pt. will ambulate with consistent 2-point gait pattern with use of SPC on all surfaces to decrease fall risk/ improve safety.    Baseline Pt. benefits from use of SPC.  Pt. currently ambulates with no assistive device.    Time 4    Period Weeks    Status New    Target Date 06/02/20      PT LONG TERM GOAL #5   Title --    Baseline --    Time --    Period --    Status --                 Plan - 05/17/20 1613    Clinical Impression Statement CGA requires with all dynamic tasks outside of //-bars secondary to several episodes of posterior LOB, esp. during turning/ Airex.  Pt. limited by poor sensation/ neuropathy in B feet while walking on Airex pads.  Pt. benefits from use of SPC with all aspects of walking in clinic and outside.  Pt. instructed to start using SPC at home, esp. while out in yard to decrease fall risk.    Personal Factors and Comorbidities Age    Examination-Activity  Limitations Locomotion Level    Examination-Participation Restrictions Cleaning;Community Activity;Yard Work    [employer]chant [employer] officer Evolving/Moderate complexity    Clinical Decision Making Moderate    Rehab Potential Good    PT Frequency 2x / week    PT Duration 4 weeks    PT Treatment/Interventions ADLs/Self Care Home Management;Moist Heat;Cryotherapy;Electrical Stimulation;DME Instruction;Gait training;Stair training;Functional mobility training;Therapeutic activities;Therapeutic exercise;Balance training;Neuromuscular re-education;Patient/family education;Orthotic Fit/Training;Manual techniques;Compression bandaging;Energy conservation    PT Next Visit Plan Dynamic balance/ gait activites.    PT Home Exercise Plan PT reviewed progressive LE strengthening/ balance tasks.    Consulted and Agree with Plan of Care Patient           Patient will benefit from skilled therapeutic intervention in order to improve the following deficits and impairments:  Abnormal gait,Improper body mechanics,Pain,Decreased coordination,Decreased mobility,Postural dysfunction,Decreased activity tolerance,Decreased endurance,Decreased range of motion,Decreased strength,Decreased balance,Difficulty walking,Impaired flexibility  Visit Diagnosis: Balance disorder  Muscle weakness (generalized)  History of falling  Pain in right leg  Pain in left leg     Problem List Patient Active Problem List   Diagnosis Date Noted  . B12 deficiency 11/05/2018  . Abnormal SPEP 10/19/2018  . Normocytic anemia 10/19/2018  . Leukopenia 10/19/2018  . Lymphopenia 06/07/2016  . Abnormal weight loss 06/07/2016  . Community acquired pneumonia of left lower lobe of lung   . Pneumonia of left lower lobe due to Streptococcus pneumoniae ([place])   . Acute respiratory failure ([place])   . Bipolar disorder with moderate depression ([place]) 03/01/2016  . Hypotension 02/29/2016  . IDA (iron deficiency anemia) 06/19/2014  .  Leucopenia 06/19/2014   Pura Spice, PT, DPT # [id number] 05/17/2020, 4:17 PM  [facility] [address] [place], [place], [address] Phone: [phone]   Fax:  [phone]  Name: DEMECO DUCKSWORTH MRN: [id number] Date of Birth: 1940/09/22

## 2020-05-16 NOTE — Therapy (Addendum)
[facility] [address] [place], [place], [address] Phone: [phone]   Fax:  [phone]  Physical Therapy Treatment  Patient Details  Name: David [name] MRN: [id number] Date of Birth: 1940-09-21 Referring Provider (PT): Dr. Manuella Ghazi   Encounter Date: 05/12/2020   PT End of Session - 05/16/20 1325    Visit Number 3    Number of Visits 9    Date for PT Re-Evaluation 06/02/20    Authorization - Visit Number 3    Authorization - Number of Visits 10    PT Start Time 0812    PT Stop Time 0902    PT Time Calculation (min) 50 min    Equipment Utilized During Treatment Gait belt    Activity Tolerance Patient tolerated treatment well    Behavior During Therapy [facility] for tasks assessed/performed           Past Medical History:  Diagnosis Date   Allergic rhinitis    Anemia    Bipolar disorder ([place])    Elevated PSA    had biopsy at [facility] around 2009-2010 negative for malignancy   Erectile dysfunction    GERD (gastroesophageal reflux disease)    Leucopenia    Osteoarthritis    in hands   Prostatitis    Sigmoid polyp     Past Surgical History:  Procedure Laterality Date   COLONOSCOPY  10/20/12   HERNIA REPAIR     Left knee replacement     Right shoulder replacement      There were no vitals filed for this visit.   Subjective Assessment - 05/17/20 1624    Subjective Pt. states he was not too compliant with HEP this past weekend.  Pt. entered PT with use of Hurrycane.  No falls at home.  Pt. continues to work on reseeding yard.    Pertinent History Pt. active with yard work and member of [employer].    Limitations Walking;House hold activities    Patient Stated Goals Increase LE strengthening/ independence with walking and balance tasks.    Currently in Pain? No/denies           There.ex.:  Nustep L5 10 min. B UE/LE (warm-up/ discussed weekend activities/ sized cane)  Standing hip flexion/ step  ups/ downs.  Sit to stands 10x.  Neuro.mm.:  Functional reaching to cones at sh. Level (limited shoulder flexion)- in //-bars with CGA.    BOSU step ups forward/ lateral in //-bars.  Standing wt. Shifting.  Resisted gait 2BTB 5x all 4-planes of movement.    Walking in clinic with pts. Hurrycane and outside on varying terrain/ grass.      PT Long Term Goals - 05/17/20 1554      PT LONG TERM GOAL #1   Title Pt. will complete TUG in <10 sec. to improve dynamic gait/ decrease fall risk.    Baseline TUG: 10.8 sec.    Time 4    Period Weeks    Status New    Target Date 06/02/20      PT LONG TERM GOAL #2   Title Pt. will demonstrate a score of at least >50/56 on Berg to indicate decrease in fall risk.    Baseline IE: 44/56    Time 4    Period Weeks    Status New    Target Date 06/02/20      PT LONG TERM GOAL #3   Title Pt will score 66 on FOTO to indicate an  increase in functional mobility    Baseline IE: 56    Time 4    Period Weeks    Status New    Target Date 06/02/20      PT LONG TERM GOAL #4   Title Pt. will ambulate with consistent 2-point gait pattern with use of SPC on all surfaces to decrease fall risk/ improve safety.    Baseline Pt. benefits from use of SPC.  Pt. currently ambulates with no assistive device.    Time 4    Period Weeks    Status New    Target Date 06/02/20      PT LONG TERM GOAL #5   Title --    Baseline --    Time --    Period --    Status --                 Plan - 05/17/20 1625    Clinical Impression Statement Pt. requires cuing/ CGA with functional reaching and returning to upright posture.  Limited shoulder flexion AROM with reaching.   PT adjusted pt. personal cane to improve 2-point gait pattern and prevent lateral lean/ sway.  Difficulty with resisted gait, esp. lateral walking.  Good LE strength/ muscle endurance noted t/o tx. session.    Personal Factors and Comorbidities Age    Examination-Activity Limitations Locomotion  Level    Examination-Participation Restrictions Cleaning;Community Activity;Yard Work    [employer]chant [employer] officer Evolving/Moderate complexity    Clinical Decision Making Moderate    Rehab Potential Good    PT Frequency 2x / week    PT Duration 4 weeks    PT Treatment/Interventions ADLs/Self Care Home Management;Moist Heat;Cryotherapy;Electrical Stimulation;DME Instruction;Gait training;Stair training;Functional mobility training;Therapeutic activities;Therapeutic exercise;Balance training;Neuromuscular re-education;Patient/family education;Orthotic Fit/Training;Manual techniques;Compression bandaging;Energy conservation    PT Next Visit Plan Dynamic balance/ gait activites.    PT Home Exercise Plan PT reviewed progressive LE strengthening/ balance tasks.    Consulted and Agree with Plan of Care Patient           Patient will benefit from skilled therapeutic intervention in order to improve the following deficits and impairments:  Abnormal gait,Improper body mechanics,Pain,Decreased coordination,Decreased mobility,Postural dysfunction,Decreased activity tolerance,Decreased endurance,Decreased range of motion,Decreased strength,Decreased balance,Difficulty walking,Impaired flexibility  Visit Diagnosis: Balance disorder  Muscle weakness (generalized)  History of falling  Pain in right leg  Pain in left leg     Problem List Patient Active Problem List   Diagnosis Date Noted   B12 deficiency 11/05/2018   Abnormal SPEP 10/19/2018   Normocytic anemia 10/19/2018   Leukopenia 10/19/2018   Lymphopenia 06/07/2016   Abnormal weight loss 06/07/2016   Community acquired pneumonia of left lower lobe of lung    Pneumonia of left lower lobe due to Streptococcus pneumoniae ([place])    Acute respiratory failure ([place])    Bipolar disorder with moderate depression ([place]) 03/01/2016   Hypotension 02/29/2016   IDA (iron deficiency anemia) 06/19/2014   Leucopenia 06/19/2014    Pura Spice, PT, DPT # [id number] 05/17/2020, 4:28 PM  [facility] [address] [place], [place], [address] Phone: [phone]   Fax:  [phone]  Name: David [name] MRN: [id number] Date of Birth: 02-Sep-1940

## 2020-05-16 NOTE — Therapy (Addendum)
[facility] [address] [place], [place], [address] Phone: [phone]   Fax:  [phone]  Physical Therapy Treatment  Patient Details  Name: David [name] MRN: [id number] Date of Birth: 09-30-1940 Referring Provider (PT): Dr. Manuella Ghazi   Encounter Date: 05/14/2020    Treatment: 4 of 9.  Recert date: 5/63/8756 0814 to 4332  Past Medical History:  Diagnosis Date  . Allergic rhinitis   . Anemia   . Bipolar disorder ([place])   . Elevated PSA    had biopsy at [facility] around 2009-2010 negative for malignancy  . Erectile dysfunction   . GERD (gastroesophageal reflux disease)   . Leucopenia   . Osteoarthritis    in hands  . Prostatitis   . Sigmoid polyp     Past Surgical History:  Procedure Laterality Date  . COLONOSCOPY  10/20/12  . HERNIA REPAIR    . Left knee replacement    . Right shoulder replacement      There were no vitals filed for this visit.   Subjective Assessment - 05/20/20 1930    Subjective Pt. reports no new complaints.  No falls this week.  Pt. discussed his chronic shoulder limitations.    Pertinent History Pt. active with yard work and member of [employer].    Limitations Walking;House hold activities    Patient Stated Goals Increase LE strengthening/ independence with walking and balance tasks.    Currently in Pain? No/denies               There.ex.:  Seated sh. Pulley ex. (issued pulley)- discussed outdoor activities  Nustep L5 10 min. B UE/LE (warm-up)  Standing GTB scap. Retraction 20x each.    Standing hip flexion/ step ups/ downs.  Sit to stands 10x.   Neuro.mm.:  BOSU step ups forward/ lateral in //-bars.  Standing wt. Shifting.  Resisted gait 1-2BTB 5x all 4-planes of movement.    Walking in clinic with pts. Hurrycane and outside on varying terrain/ grass.   Turning L/R in hallway working on foot placement/ proper technique.       PT Long Term Goals - 05/17/20 1554       PT LONG TERM GOAL #1   Title Pt. will complete TUG in <10 sec. to improve dynamic gait/ decrease fall risk.    Baseline TUG: 10.8 sec.    Time 4    Period Weeks    Status New    Target Date 06/02/20      PT LONG TERM GOAL #2   Title Pt. will demonstrate a score of at least >50/56 on Berg to indicate decrease in fall risk.    Baseline IE: 44/56    Time 4    Period Weeks    Status New    Target Date 06/02/20      PT LONG TERM GOAL #3   Title Pt will score 66 on FOTO to indicate an increase in functional mobility    Baseline IE: 56    Time 4    Period Weeks    Status New    Target Date 06/02/20      PT LONG TERM GOAL #4   Title Pt. will ambulate with consistent 2-point gait pattern with use of SPC on all surfaces to decrease fall risk/ improve safety.    Baseline Pt. benefits from use of SPC.  Pt. currently ambulates with no assistive device.    Time 4    Period Weeks  Status New    Target Date 06/02/20      PT LONG TERM GOAL #5   Title --    Baseline --    Time --    Period --    Status --              Plan - 05/20/20 1931    Clinical Impression Statement CGA with all aspects of resisted gait in //-bars and mod. cuing to correct posture/ head position.  Pt. issued shoulder pulleys to work on AAROM of shoulder at home.  Pt. encouraged to continue with current LE program to improve walking/ balance with least assistive device.  Several losses of balance during dynamic balance tasks requiring UE assist in //-bars and CGA from PT.  Pt. has occasional scissoring gait when walking without use of SPC and turning quickly to R or L.  Pt. encouraged to use SPC with all walking in yard to decrease fall risk.    Personal Factors and Comorbidities Age    Examination-Activity Limitations Locomotion Level    Examination-Participation Restrictions Cleaning;Community Activity;Yard Work    [employer]chant [employer] officer Evolving/Moderate complexity    Clinical Decision  Making Moderate    Rehab Potential Good    PT Frequency 2x / week    PT Duration 4 weeks    PT Treatment/Interventions ADLs/Self Care Home Management;Moist Heat;Cryotherapy;Electrical Stimulation;DME Instruction;Gait training;Stair training;Functional mobility training;Therapeutic activities;Therapeutic exercise;Balance training;Neuromuscular re-education;Patient/family education;Orthotic Fit/Training;Manual techniques;Compression bandaging;Energy conservation    PT Next Visit Plan Dynamic balance/ gait activites.    PT Home Exercise Plan PT reviewed progressive LE strengthening/ balance tasks.    Consulted and Agree with Plan of Care Patient           Patient will benefit from skilled therapeutic intervention in order to improve the following deficits and impairments:  Abnormal gait,Improper body mechanics,Pain,Decreased coordination,Decreased mobility,Postural dysfunction,Decreased activity tolerance,Decreased endurance,Decreased range of motion,Decreased strength,Decreased balance,Difficulty walking,Impaired flexibility  Visit Diagnosis: Balance disorder  Muscle weakness (generalized)  History of falling  Pain in right leg  Pain in left leg     Problem List Patient Active Problem List   Diagnosis Date Noted  . B12 deficiency 11/05/2018  . Abnormal SPEP 10/19/2018  . Normocytic anemia 10/19/2018  . Leukopenia 10/19/2018  . Lymphopenia 06/07/2016  . Abnormal weight loss 06/07/2016  . Community acquired pneumonia of left lower lobe of lung   . Pneumonia of left lower lobe due to Streptococcus pneumoniae ([place])   . Acute respiratory failure ([place])   . Bipolar disorder with moderate depression ([place]) 03/01/2016  . Hypotension 02/29/2016  . IDA (iron deficiency anemia) 06/19/2014  . Leucopenia 06/19/2014   Pura Spice, PT, DPT # [id number] 05/21/2020, 12:42 PM  [facility] [address] [place], [place], [address] Phone:  [phone]   Fax:  [phone]  Name: David [name] MRN: [id number] Date of Birth: [birth year]

## 2020-05-17 NOTE — Addendum Note (Signed)
Addended by: Pura Spice on: 05/17/2020 04:05 PM   Modules accepted: Orders

## 2020-05-19 ENCOUNTER — Ambulatory Visit: Payer: Medicare HMO | Admitting: Physical Therapy

## 2020-05-19 ENCOUNTER — Other Ambulatory Visit: Payer: Self-pay

## 2020-05-19 DIAGNOSIS — M6281 Muscle weakness (generalized): Secondary | ICD-10-CM | POA: Diagnosis not present

## 2020-05-19 DIAGNOSIS — R2689 Other abnormalities of gait and mobility: Secondary | ICD-10-CM | POA: Diagnosis not present

## 2020-05-19 DIAGNOSIS — M79604 Pain in right leg: Secondary | ICD-10-CM | POA: Diagnosis not present

## 2020-05-19 DIAGNOSIS — M79605 Pain in left leg: Secondary | ICD-10-CM

## 2020-05-19 DIAGNOSIS — Z9181 History of falling: Secondary | ICD-10-CM | POA: Diagnosis not present

## 2020-05-20 ENCOUNTER — Encounter: Payer: Self-pay | Admitting: Physical Therapy

## 2020-05-21 ENCOUNTER — Ambulatory Visit: Payer: Medicare HMO | Admitting: Physical Therapy

## 2020-05-21 ENCOUNTER — Encounter: Payer: Self-pay | Admitting: Physical Therapy

## 2020-05-21 ENCOUNTER — Other Ambulatory Visit: Payer: Self-pay

## 2020-05-21 DIAGNOSIS — R2689 Other abnormalities of gait and mobility: Secondary | ICD-10-CM | POA: Diagnosis not present

## 2020-05-21 DIAGNOSIS — M79604 Pain in right leg: Secondary | ICD-10-CM | POA: Diagnosis not present

## 2020-05-21 DIAGNOSIS — M79605 Pain in left leg: Secondary | ICD-10-CM

## 2020-05-21 DIAGNOSIS — M6281 Muscle weakness (generalized): Secondary | ICD-10-CM | POA: Diagnosis not present

## 2020-05-21 DIAGNOSIS — Z9181 History of falling: Secondary | ICD-10-CM | POA: Diagnosis not present

## 2020-05-21 NOTE — Therapy (Signed)
[place] [facility] [address]. [place], [place], [address] Phone: [phone]   Fax:  [phone]  Physical Therapy Treatment  Patient Details  Name: David [name] MRN: [id number] Date of Birth: 08-01-1940 Referring Provider (PT): Dr. Manuella Ghazi   Encounter Date: 05/19/2020   PT End of Session - 05/21/20 1248    Visit Number 5    Number of Visits 9    Date for PT Re-Evaluation 06/02/20    Authorization - Visit Number 5    Authorization - Number of Visits 10    PT Start Time 0816    PT Stop Time 0901    PT Time Calculation (min) 45 min    Equipment Utilized During Treatment Gait belt    Activity Tolerance Patient tolerated treatment well    Behavior During Therapy [facility] for tasks assessed/performed           Past Medical History:  Diagnosis Date  . Allergic rhinitis   . Anemia   . Bipolar disorder ([place])   . Elevated PSA    had biopsy at [facility] around 2009-2010 negative for malignancy  . Erectile dysfunction   . GERD (gastroesophageal reflux disease)   . Leucopenia   . Osteoarthritis    in hands  . Prostatitis   . Sigmoid polyp     Past Surgical History:  Procedure Laterality Date  . COLONOSCOPY  10/20/12  . HERNIA REPAIR    . Left knee replacement    . Right shoulder replacement      There were no vitals filed for this visit.   Subjective Assessment - 05/21/20 1247    Subjective Pt. reports no falls this past weekend.  Pt. entered PT without use of Hurrycane and states he left device in car.  No pain reported.    Pertinent History Pt. active with yard work and member of [employer].    Limitations Walking;House hold activities    Patient Stated Goals Increase LE strengthening/ independence with walking and balance tasks.    Currently in Pain? No/denies               There.ex.:  Nustep L6 10 min. B UE/LE (warm-up/ discussed weekend activities)  Step ups on 6" step with light to no UE assist (CGA for  safety).  Ascending/ descending stairs with cuing to slow down/ increase BOS.    Forward/ lateral walking in //-bars with mirror feedback (no UE assist).    Reviewed HEP   Neuro.mm.:  Walking alt. UE/LE touches in hallway/ //-bars with CGA for safety.  Several LOB with UE assist for correction.    BOSU step ups forward/ lateral in //-bars.  Standing wt. Shifting.  Braiding/ tandem gait in //-bars.   Walking in clinic with pts. Hurrycane and outside on varying terrain/ grass.     PT Long Term Goals - 05/17/20 1554      PT LONG TERM GOAL #1   Title Pt. will complete TUG in <10 sec. to improve dynamic gait/ decrease fall risk.    Baseline TUG: 10.8 sec.    Time 4    Period Weeks    Status New    Target Date 06/02/20      PT LONG TERM GOAL #2   Title Pt. will demonstrate a score of at least >50/56 on Berg to indicate decrease in fall risk.    Baseline IE: 44/56    Time 4    Period Weeks    Status  New    Target Date 06/02/20      PT LONG TERM GOAL #3   Title Pt will score 66 on FOTO to indicate an increase in functional mobility    Baseline IE: 56    Time 4    Period Weeks    Status New    Target Date 06/02/20      PT LONG TERM GOAL #4   Title Pt. will ambulate with consistent 2-point gait pattern with use of SPC on all surfaces to decrease fall risk/ improve safety.    Baseline Pt. benefits from use of SPC.  Pt. currently ambulates with no assistive device.    Time 4    Period Weeks    Status New    Target Date 06/02/20      PT LONG TERM GOAL #5   Title --    Baseline --    Time --    Period --    Status --                 Plan - 05/21/20 1248    Clinical Impression Statement Pt. requires CGA/min. A while walking with alt. UE/LE touches in hallway.  Occasional scissoring gait pattern noted and use of wall or //-bars to correct LOB.  Extra time/ difficulty with braiding and mod. cuing for upright posture/ technique.  PT reviewed HEP and emphasized  the importance using assistive device with outside walking and taking breaks if LE fatigue noted.  Pt. works really hard during tx. session and focused on improving balance/ walking independence.    Personal Factors and Comorbidities Age    Examination-Activity Limitations Locomotion Level    Examination-Participation Restrictions Cleaning;Community Activity;Yard Work    Merchant [employer] officer Evolving/Moderate complexity    Clinical Decision Making Moderate    Rehab Potential Good    PT Frequency 2x / week    PT Duration 4 weeks    PT Treatment/Interventions ADLs/Self Care Home Management;Moist Heat;Cryotherapy;Electrical Stimulation;DME Instruction;Gait training;Stair training;Functional mobility training;Therapeutic activities;Therapeutic exercise;Balance training;Neuromuscular re-education;Patient/family education;Orthotic Fit/Training;Manual techniques;Compression bandaging;Energy conservation    PT Next Visit Plan Dynamic balance/ gait activites.    PT Home Exercise Plan PT reviewed progressive LE strengthening/ balance tasks.    Consulted and Agree with Plan of Care Patient           Patient will benefit from skilled therapeutic intervention in order to improve the following deficits and impairments:  Abnormal gait,Improper body mechanics,Pain,Decreased coordination,Decreased mobility,Postural dysfunction,Decreased activity tolerance,Decreased endurance,Decreased range of motion,Decreased strength,Decreased balance,Difficulty walking,Impaired flexibility  Visit Diagnosis: Balance disorder  Muscle weakness (generalized)  History of falling  Pain in right leg  Pain in left leg     Problem List Patient Active Problem List   Diagnosis Date Noted  . B12 deficiency 11/05/2018  . Abnormal SPEP 10/19/2018  . Normocytic anemia 10/19/2018  . Leukopenia 10/19/2018  . Lymphopenia 06/07/2016  . Abnormal weight loss 06/07/2016  . Community acquired pneumonia of left  lower lobe of lung   . Pneumonia of left lower lobe due to Streptococcus pneumoniae ([place])   . Acute respiratory failure ([place])   . Bipolar disorder with moderate depression ([place]) 03/01/2016  . Hypotension 02/29/2016  . IDA (iron deficiency anemia) 06/19/2014  . Leucopenia 06/19/2014   Pura Spice, PT, DPT # [phone] 05/21/2020, 12:53 PM  [facility] [address] [place], [place], [address] Phone: [phone]   Fax:  [phone]  Name: David [name] MRN: [id number]  Date of Birth: 1940/11/01

## 2020-05-21 NOTE — Therapy (Signed)
[place] [facility] [address] [place], [place], [address] Phone: [phone]   Fax:  [phone]  Physical Therapy Treatment  Patient Details  Name: David [name] MRN: [id number] Date of Birth: 1940/06/03 Referring Provider (PT): Dr. Manuella Ghazi   Encounter Date: 05/21/2020   PT End of Session - 05/21/20 1824    Visit Number 6    Number of Visits 9    Date for PT Re-Evaluation 06/02/20    Authorization - Visit Number 6    Authorization - Number of Visits 10    PT Start Time 0813    PT Stop Time 0900    PT Time Calculation (min) 47 min    Equipment Utilized During Treatment Gait belt    Activity Tolerance Patient tolerated treatment well    Behavior During Therapy [facility] for tasks assessed/performed           Past Medical History:  Diagnosis Date  . Allergic rhinitis   . Anemia   . Bipolar disorder ([place])   . Elevated PSA    had biopsy at [facility] around 2009-2010 negative for malignancy  . Erectile dysfunction   . GERD (gastroesophageal reflux disease)   . Leucopenia   . Osteoarthritis    in hands  . Prostatitis   . Sigmoid polyp     Past Surgical History:  Procedure Laterality Date  . COLONOSCOPY  10/20/12  . HERNIA REPAIR    . Left knee replacement    . Right shoulder replacement      There were no vitals filed for this visit.   Subjective Assessment - 05/21/20 1823    Subjective Pt. reports a fall outside while relocated grape vine.  Pt. fell backwards on L hip (soreness noted).  Pt. entered PT clinic with use of SPC, not hurrycare today.    Pertinent History Pt. active with yard work and member of [employer].    Limitations Walking;House hold activities    Patient Stated Goals Increase LE strengthening/ independence with walking and balance tasks.    Currently in Pain? No/denies             There.ex.:  Supine L hip assessment (all planes)- proximal hamstring discomfort/ TTP  Scifit L6 10 min. B UE/LE  (discussed fall in yard).    Step ups on 6" step with light to no UE assist (CGA for safety).  Ascending/ descending stairs with cuing to slow down/ increase BOS.    Seated shoulder pulley (flexion/ abduction).    Reviewed HEP   Neuro.mm.:  Walking in hallway (varying cadences) with and without SPC.  Alt. UE/LE touches in hallway/ //-bars with CGA for safety.  Several LOB with UE assist for correction.    Braiding/ tandem gait in //-bars.   Outside walking on varying terrain with use of SPC (working on head position/ posture and stepping down curb).       PT Long Term Goals - 05/17/20 1554      PT LONG TERM GOAL #1   Title Pt. will complete TUG in <10 sec. to improve dynamic gait/ decrease fall risk.    Baseline TUG: 10.8 sec.    Time 4    Period Weeks    Status New    Target Date 06/02/20      PT LONG TERM GOAL #2   Title Pt. will demonstrate a score of at least >50/56 on Berg to indicate decrease in fall risk.    Baseline IE: 44/56  Time 4    Period Weeks    Status New    Target Date 06/02/20      PT LONG TERM GOAL #3   Title Pt will score 66 on FOTO to indicate an increase in functional mobility    Baseline IE: 56    Time 4    Period Weeks    Status New    Target Date 06/02/20      PT LONG TERM GOAL #4   Title Pt. will ambulate with consistent 2-point gait pattern with use of SPC on all surfaces to decrease fall risk/ improve safety.    Baseline Pt. benefits from use of SPC.  Pt. currently ambulates with no assistive device.    Time 4    Period Weeks    Status New    Target Date 06/02/20      PT LONG TERM GOAL #5   Title --    Baseline --    Time --    Period --    Status --                 Plan - 05/21/20 1824    Clinical Impression Statement PT assessed L hip in supine position with minimal soreness noted during proximal hamstring stretches.  No discomfort with lumbar rotn./ piriformis stretches.  TTP to L proximal hamstring/  interior glut.  Pt. continues to require CGA with all aspects of dynamic activity and walking without assistive device.    Personal Factors and Comorbidities Age    Examination-Activity Limitations Locomotion Level    Examination-Participation Restrictions Cleaning;Community Activity;Yard Work    [employer]chant [employer] officer Evolving/Moderate complexity    Clinical Decision Making Moderate    Rehab Potential Good    PT Frequency 2x / week    PT Duration 4 weeks    PT Treatment/Interventions ADLs/Self Care Home Management;Moist Heat;Cryotherapy;Electrical Stimulation;DME Instruction;Gait training;Stair training;Functional mobility training;Therapeutic activities;Therapeutic exercise;Balance training;Neuromuscular re-education;Patient/family education;Orthotic Fit/Training;Manual techniques;Compression bandaging;Energy conservation    PT Next Visit Plan Dynamic balance/ gait activites.    PT Home Exercise Plan PT reviewed progressive LE strengthening/ balance tasks.    Consulted and Agree with Plan of Care Patient           Patient will benefit from skilled therapeutic intervention in order to improve the following deficits and impairments:  Abnormal gait,Improper body mechanics,Pain,Decreased coordination,Decreased mobility,Postural dysfunction,Decreased activity tolerance,Decreased endurance,Decreased range of motion,Decreased strength,Decreased balance,Difficulty walking,Impaired flexibility  Visit Diagnosis: Balance disorder  Muscle weakness (generalized)  History of falling  Pain in right leg  Pain in left leg     Problem List Patient Active Problem List   Diagnosis Date Noted  . B12 deficiency 11/05/2018  . Abnormal SPEP 10/19/2018  . Normocytic anemia 10/19/2018  . Leukopenia 10/19/2018  . Lymphopenia 06/07/2016  . Abnormal weight loss 06/07/2016  . Community acquired pneumonia of left lower lobe of lung   . Pneumonia of left lower lobe due to Streptococcus  pneumoniae ([place])   . Acute respiratory failure ([place])   . Bipolar disorder with moderate depression ([place]) 03/01/2016  . Hypotension 02/29/2016  . IDA (iron deficiency anemia) 06/19/2014  . Leucopenia 06/19/2014   Pura Spice, PT, DPT # [phone] 05/21/2020, 6:30 PM  [facility] [address] [place], [place], [address] Phone: [phone]   Fax:  [phone]  Name: David [name] MRN: [id number] Date of Birth: [birth year]

## 2020-05-22 DIAGNOSIS — M48062 Spinal stenosis, lumbar region with neurogenic claudication: Secondary | ICD-10-CM | POA: Diagnosis not present

## 2020-05-22 DIAGNOSIS — M5136 Other intervertebral disc degeneration, lumbar region: Secondary | ICD-10-CM | POA: Diagnosis not present

## 2020-05-22 DIAGNOSIS — M5416 Radiculopathy, lumbar region: Secondary | ICD-10-CM | POA: Diagnosis not present

## 2020-05-26 ENCOUNTER — Encounter: Payer: Self-pay | Admitting: Physical Therapy

## 2020-05-26 ENCOUNTER — Ambulatory Visit: Payer: Medicare HMO | Attending: Neurology | Admitting: Physical Therapy

## 2020-05-26 DIAGNOSIS — M79605 Pain in left leg: Secondary | ICD-10-CM | POA: Insufficient documentation

## 2020-05-26 DIAGNOSIS — M79604 Pain in right leg: Secondary | ICD-10-CM | POA: Insufficient documentation

## 2020-05-26 DIAGNOSIS — R2689 Other abnormalities of gait and mobility: Secondary | ICD-10-CM | POA: Insufficient documentation

## 2020-05-26 DIAGNOSIS — M6281 Muscle weakness (generalized): Secondary | ICD-10-CM | POA: Insufficient documentation

## 2020-05-26 DIAGNOSIS — Z9181 History of falling: Secondary | ICD-10-CM | POA: Insufficient documentation

## 2020-05-26 NOTE — Therapy (Signed)
[facility] [address] [place], [place], [address] Phone: [phone]   Fax:  [phone]  Physical Therapy Treatment  Patient Details  Name: David [name] MRN: [id number] Date of Birth: December 08, 1940 Referring Provider (PT): Dr. Manuella Ghazi   Encounter Date: 05/26/2020   PT End of Session - 05/26/20 0756    Visit Number 6    Number of Visits 9    Date for PT Re-Evaluation 06/02/20    Authorization - Visit Number 6    Authorization - Number of Visits 10    Equipment Utilized During Treatment Gait belt    Activity Tolerance Patient tolerated treatment well    Behavior During Therapy [facility] for tasks assessed/performed           Past Medical History:  Diagnosis Date  . Allergic rhinitis   . Anemia   . Bipolar disorder ([place])   . Elevated PSA    had biopsy at [facility] around 2009-2010 negative for malignancy  . Erectile dysfunction   . GERD (gastroesophageal reflux disease)   . Leucopenia   . Osteoarthritis    in hands  . Prostatitis   . Sigmoid polyp     Past Surgical History:  Procedure Laterality Date  . COLONOSCOPY  10/20/12  . HERNIA REPAIR    . Left knee replacement    . Right shoulder replacement      There were no vitals filed for this visit.   Subjective Assessment - 05/26/20 0754    Subjective Pt. called PT to report he feels he is doing okay and is planning to return to gym/ complete HEP.  Pt. reports another fall in yard while working and PT expressed concerns.  PT recommends pt. use SPC with all walking, esp. outside.    Pertinent History Pt. active with yard work and member of [employer].    Limitations Walking;House hold activities    Patient Stated Goals Increase LE strengthening/ independence with walking and balance tasks.    Currently in Pain? No/denies                 No treatment session today.       PT Long Term Goals - 05/17/20 1554      PT LONG TERM GOAL #1   Title Pt. will complete  TUG in <10 sec. to improve dynamic gait/ decrease fall risk.    Baseline TUG: 10.8 sec.    Time 4    Period Weeks    Status New    Target Date 06/02/20      PT LONG TERM GOAL #2   Title Pt. will demonstrate a score of at least >50/56 on Berg to indicate decrease in fall risk.    Baseline IE: 44/56    Time 4    Period Weeks    Status New    Target Date 06/02/20      PT LONG TERM GOAL #3   Title Pt will score 66 on FOTO to indicate an increase in functional mobility    Baseline IE: 56    Time 4    Period Weeks    Status New    Target Date 06/02/20      PT LONG TERM GOAL #4   Title Pt. will ambulate with consistent 2-point gait pattern with use of SPC on all surfaces to decrease fall risk/ improve safety.    Baseline Pt. benefits from use of SPC.  Pt. currently ambulates with no assistive  device.    Time 4    Period Weeks    Status New    Target Date 06/02/20      PT LONG TERM GOAL #5   Title --    Baseline --    Time --    Period --    Status --                 Plan - 05/26/20 0756    Clinical Impression Statement No treatment session today.  Pt. called PT to state he is doing well and will continue with gym based/HEP.  Pt. instructed to contact PT if any regression of symptoms or questions.  Pt. agrees and instructed to use SPC to decrease fall risk.    Personal Factors and Comorbidities Age    Examination-Activity Limitations Locomotion Level    Examination-Participation Restrictions Cleaning;Community Activity;Yard Work    Merchant [employer] officer Evolving/Moderate complexity    Clinical Decision Making Moderate    Rehab Potential Good    PT Frequency 2x / week    PT Duration 4 weeks    PT Treatment/Interventions ADLs/Self Care Home Management;Moist Heat;Cryotherapy;Electrical Stimulation;DME Instruction;Gait training;Stair training;Functional mobility training;Therapeutic activities;Therapeutic exercise;Balance training;Neuromuscular  re-education;Patient/family education;Orthotic Fit/Training;Manual techniques;Compression bandaging;Energy conservation    PT Next Visit Plan Pt. will call PT in a couple weeks to discuss status.    PT Home Exercise Plan PT reviewed progressive LE strengthening/ balance tasks.    Consulted and Agree with Plan of Care Patient           Patient will benefit from skilled therapeutic intervention in order to improve the following deficits and impairments:  Abnormal gait,Improper body mechanics,Pain,Decreased coordination,Decreased mobility,Postural dysfunction,Decreased activity tolerance,Decreased endurance,Decreased range of motion,Decreased strength,Decreased balance,Difficulty walking,Impaired flexibility  Visit Diagnosis: Balance disorder  Muscle weakness (generalized)  History of falling  Pain in right leg  Pain in left leg     Problem List Patient Active Problem List   Diagnosis Date Noted  . B12 deficiency 11/05/2018  . Abnormal SPEP 10/19/2018  . Normocytic anemia 10/19/2018  . Leukopenia 10/19/2018  . Lymphopenia 06/07/2016  . Abnormal weight loss 06/07/2016  . Community acquired pneumonia of left lower lobe of lung   . Pneumonia of left lower lobe due to Streptococcus pneumoniae ([place])   . Acute respiratory failure ([place])   . Bipolar disorder with moderate depression ([place]) 03/01/2016  . Hypotension 02/29/2016  . IDA (iron deficiency anemia) 06/19/2014  . Leucopenia 06/19/2014   Pura Spice, PT, DPT # [phone] 05/26/2020, 8:06 AM  [facility] [address] [place], [place], [address] Phone: [phone]   Fax:  [phone]  Name: David [name] MRN: [id number] Date of Birth: 1941/01/26

## 2020-05-28 ENCOUNTER — Ambulatory Visit: Payer: Medicare HMO | Admitting: Physical Therapy

## 2020-06-02 ENCOUNTER — Encounter: Payer: Medicare HMO | Admitting: Physical Therapy

## 2020-06-04 ENCOUNTER — Encounter: Payer: Medicare HMO | Admitting: Physical Therapy

## 2020-06-04 DIAGNOSIS — H40013 Open angle with borderline findings, low risk, bilateral: Secondary | ICD-10-CM | POA: Diagnosis not present

## 2020-06-04 DIAGNOSIS — H353131 Nonexudative age-related macular degeneration, bilateral, early dry stage: Secondary | ICD-10-CM | POA: Diagnosis not present

## 2020-06-09 ENCOUNTER — Encounter: Payer: Medicare HMO | Admitting: Physical Therapy

## 2020-06-11 ENCOUNTER — Encounter: Payer: Medicare HMO | Admitting: Physical Therapy

## 2020-06-16 ENCOUNTER — Encounter: Payer: Medicare HMO | Admitting: Physical Therapy

## 2020-06-16 DIAGNOSIS — N200 Calculus of kidney: Secondary | ICD-10-CM | POA: Diagnosis not present

## 2020-06-16 DIAGNOSIS — N400 Enlarged prostate without lower urinary tract symptoms: Secondary | ICD-10-CM | POA: Diagnosis not present

## 2020-06-16 DIAGNOSIS — Z87440 Personal history of urinary (tract) infections: Secondary | ICD-10-CM | POA: Diagnosis not present

## 2020-06-16 DIAGNOSIS — Z8616 Personal history of COVID-19: Secondary | ICD-10-CM | POA: Diagnosis not present

## 2020-06-16 DIAGNOSIS — R0781 Pleurodynia: Secondary | ICD-10-CM | POA: Diagnosis not present

## 2020-06-16 DIAGNOSIS — R9341 Abnormal radiologic findings on diagnostic imaging of renal pelvis, ureter, or bladder: Secondary | ICD-10-CM | POA: Diagnosis not present

## 2020-06-16 DIAGNOSIS — N401 Enlarged prostate with lower urinary tract symptoms: Secondary | ICD-10-CM | POA: Diagnosis not present

## 2020-06-18 ENCOUNTER — Encounter: Payer: Medicare HMO | Admitting: Physical Therapy

## 2020-06-23 ENCOUNTER — Encounter: Payer: Medicare HMO | Admitting: Physical Therapy

## 2020-06-25 ENCOUNTER — Encounter: Payer: Medicare HMO | Admitting: Physical Therapy

## 2020-07-08 DIAGNOSIS — M47816 Spondylosis without myelopathy or radiculopathy, lumbar region: Secondary | ICD-10-CM | POA: Diagnosis not present

## 2020-07-08 DIAGNOSIS — R102 Pelvic and perineal pain: Secondary | ICD-10-CM | POA: Diagnosis not present

## 2020-07-15 ENCOUNTER — Other Ambulatory Visit: Payer: Self-pay | Admitting: Orthopedic Surgery

## 2020-07-15 ENCOUNTER — Other Ambulatory Visit: Payer: Self-pay

## 2020-07-15 ENCOUNTER — Other Ambulatory Visit (HOSPITAL_COMMUNITY): Payer: Self-pay | Admitting: Orthopedic Surgery

## 2020-07-15 ENCOUNTER — Ambulatory Visit
Admission: RE | Admit: 2020-07-15 | Discharge: 2020-07-15 | Disposition: A | Discharge status: Home / Self Care | Payer: Medicare HMO | Source: Ambulatory Visit | Attending: Orthopedic Surgery | Admitting: Orthopedic Surgery

## 2020-07-15 DIAGNOSIS — S79912A Unspecified injury of left hip, initial encounter: Secondary | ICD-10-CM | POA: Diagnosis not present

## 2020-07-15 DIAGNOSIS — M7062 Trochanteric bursitis, left hip: Secondary | ICD-10-CM | POA: Diagnosis not present

## 2020-07-15 DIAGNOSIS — G8929 Other chronic pain: Secondary | ICD-10-CM | POA: Diagnosis not present

## 2020-07-15 DIAGNOSIS — M25552 Pain in left hip: Secondary | ICD-10-CM | POA: Diagnosis not present

## 2020-07-15 DIAGNOSIS — M1612 Unilateral primary osteoarthritis, left hip: Secondary | ICD-10-CM | POA: Diagnosis not present

## 2020-07-15 DIAGNOSIS — M6258 Muscle wasting and atrophy, not elsewhere classified, other site: Secondary | ICD-10-CM | POA: Diagnosis not present

## 2020-07-15 DIAGNOSIS — S76312A Strain of muscle, fascia and tendon of the posterior muscle group at thigh level, left thigh, initial encounter: Secondary | ICD-10-CM | POA: Diagnosis not present

## 2020-07-28 DIAGNOSIS — S76312A Strain of muscle, fascia and tendon of the posterior muscle group at thigh level, left thigh, initial encounter: Secondary | ICD-10-CM | POA: Diagnosis not present

## 2020-08-04 ENCOUNTER — Ambulatory Visit: Payer: Medicare HMO | Admitting: Physical Therapy

## 2020-08-18 ENCOUNTER — Other Ambulatory Visit: Payer: Self-pay

## 2020-08-18 ENCOUNTER — Emergency Department
Admission: EM | Admit: 2020-08-18 | Discharge: 2020-08-18 | Disposition: A | Discharge status: Home / Self Care | Payer: Medicare HMO | Attending: Emergency Medicine | Admitting: Emergency Medicine

## 2020-08-18 DIAGNOSIS — M6281 Muscle weakness (generalized): Secondary | ICD-10-CM | POA: Insufficient documentation

## 2020-08-18 DIAGNOSIS — G8929 Other chronic pain: Secondary | ICD-10-CM | POA: Diagnosis not present

## 2020-08-18 DIAGNOSIS — R630 Anorexia: Secondary | ICD-10-CM | POA: Insufficient documentation

## 2020-08-18 DIAGNOSIS — Z96611 Presence of right artificial shoulder joint: Secondary | ICD-10-CM | POA: Diagnosis not present

## 2020-08-18 DIAGNOSIS — Z87891 Personal history of nicotine dependence: Secondary | ICD-10-CM | POA: Diagnosis not present

## 2020-08-18 DIAGNOSIS — F329 Major depressive disorder, single episode, unspecified: Secondary | ICD-10-CM | POA: Insufficient documentation

## 2020-08-18 DIAGNOSIS — Z96652 Presence of left artificial knee joint: Secondary | ICD-10-CM | POA: Insufficient documentation

## 2020-08-18 DIAGNOSIS — F32A Depression, unspecified: Secondary | ICD-10-CM

## 2020-08-18 LAB — CBC
HCT: 43.3 % (ref 39.0–52.0)
Hemoglobin: 14.7 g/dL (ref 13.0–17.0)
MCH: 32.1 pg (ref 26.0–34.0)
MCHC: 33.9 g/dL (ref 30.0–36.0)
MCV: 94.5 fL (ref 80.0–100.0)
Platelets: 197 10*3/uL (ref 150–400)
RBC: 4.58 MIL/uL (ref 4.22–5.81)
RDW: 13.4 % (ref 11.5–15.5)
WBC: 5.9 10*3/uL (ref 4.0–10.5)
nRBC: 0 % (ref 0.0–0.2)

## 2020-08-18 LAB — URINALYSIS, COMPLETE (UACMP) WITH MICROSCOPIC
Bacteria, UA: NONE SEEN
Bilirubin Urine: NEGATIVE
Glucose, UA: NEGATIVE mg/dL
Ketones, ur: 5 mg/dL — AB
Leukocytes,Ua: NEGATIVE
Nitrite: NEGATIVE
Protein, ur: NEGATIVE mg/dL
Specific Gravity, Urine: 1.019 (ref 1.005–1.030)
Squamous Epithelial / HPF: NONE SEEN (ref 0–5)
pH: 5 (ref 5.0–8.0)

## 2020-08-18 LAB — HEPATIC FUNCTION PANEL
ALT: 17 U/L (ref 0–44)
AST: 26 U/L (ref 15–41)
Albumin: 3.7 g/dL (ref 3.5–5.0)
Alkaline Phosphatase: 83 U/L (ref 38–126)
Bilirubin, Direct: 0.2 mg/dL (ref 0.0–0.2)
Indirect Bilirubin: 0.8 mg/dL (ref 0.3–0.9)
Total Bilirubin: 1 mg/dL (ref 0.3–1.2)
Total Protein: 7.2 g/dL (ref 6.5–8.1)

## 2020-08-18 LAB — MAGNESIUM: Magnesium: 2 mg/dL (ref 1.7–2.4)

## 2020-08-18 LAB — BASIC METABOLIC PANEL
Anion gap: 9 (ref 5–15)
BUN: 22 mg/dL (ref 8–23)
CO2: 24 mmol/L (ref 22–32)
Calcium: 9.7 mg/dL (ref 8.9–10.3)
Chloride: 103 mmol/L (ref 98–111)
Creatinine, Ser: 1.09 mg/dL (ref 0.61–1.24)
GFR, Estimated: >60 mL/min (ref >60)
Glucose, Bld: 115 mg/dL — ABNORMAL HIGH (ref 70–99)
Potassium: 4.3 mmol/L (ref 3.5–5.1)
Sodium: 136 mmol/L (ref 135–145)

## 2020-08-18 LAB — TSH: TSH: 0.299 u[IU]/mL — ABNORMAL LOW (ref 0.350–4.500)

## 2020-08-18 LAB — T4, FREE: Free T4: 1.04 ng/dL (ref 0.61–1.12)

## 2020-08-18 NOTE — ED Triage Notes (Signed)
Pt to ED for trouble eating and drinking, weakness for a few weeks. "I just cant eat, food is not appetizing" Wife reports pt had a fall in may, was supposed to go to therapy but has been unable to go Pt alert and oriented

## 2020-08-18 NOTE — ED Provider Notes (Signed)
[facility] Emergency Department Provider Note  ____________________________________________   Event Date/Time   First MD Initiated Contact with Patient 08/18/20 [phone]     (approximate)  I have reviewed the triage vital signs and the nursing notes.   HISTORY  Chief Complaint Failure To Thrive   HPI David [name] is a 80 y.o. male with a past medical history of osteoarthritis, GERD, bipolar disorder, anemia and chronic left hip pain after a fall approximately 8 weeks ago a left hamstring tear most recently seen by Ortho on 6/7 with decision made to not undergo surgery and pursue therapy who presents accompanied by his wife for assessment of decreased appetite over the last several weeks.  Per patient and wife it is not that he cannot eat or has difficulty eating is that he has been feeling no appetite.  Patient states he has been feeling depressed because he has not able to move his much as he typically does not and prior to his injury was a very active person.  He denies any subsequent injuries and states his pain is now much better controlled after seeing his orthopedist and PCP.  However he still states that he is not regain his mobility and is worried his appetite may be related to his mood.  He has not any fevers, chills, cough, vomiting, [place] dysuria, rash or any acute pain or other associated sick symptoms.  The symptoms have been ongoing for several weeks and have not changed specifically today.         Past Medical History:  Diagnosis Date   Allergic rhinitis    Anemia    Bipolar disorder ([place])    Elevated PSA    had biopsy at [facility] around 2009-2010 negative for malignancy   Erectile dysfunction    GERD (gastroesophageal reflux disease)    Leucopenia    Osteoarthritis    in hands   Prostatitis    Sigmoid polyp     Patient Active Problem List   Diagnosis Date Noted   B12 deficiency 11/05/2018   Abnormal SPEP 10/19/2018   Normocytic anemia  10/19/2018   Leukopenia 10/19/2018   Lymphopenia 06/07/2016   Abnormal weight loss 06/07/2016   Community acquired pneumonia of left lower lobe of lung    Pneumonia of left lower lobe due to Streptococcus pneumoniae ([place])    Acute respiratory failure ([place])    Bipolar disorder with moderate depression ([place]) 03/01/2016   Hypotension 02/29/2016   IDA (iron deficiency anemia) 06/19/2014   Leucopenia 06/19/2014    Past Surgical History:  Procedure Laterality Date   COLONOSCOPY  10/20/12   HERNIA REPAIR     Left knee replacement     Right shoulder replacement      Prior to Admission medications   Medication Sig Start Date End Date Taking? Authorizing Provider  acetaminophen (TYLENOL) 500 MG tablet Take 500 mg by mouth every 8 (eight) hours as needed for moderate pain.     [provider]  azelastine (ASTELIN) 0.1 % nasal spray Place into the nose. 04/13/17 04/13/18  [provider]  Blood Pressure Monitor DEVI  06/28/17   [provider]  Cholecalciferol (D 1000) 1000 units capsule Take by mouth.    [provider]  FIRST AID KIT  05/21/17   [provider]  Multiple Vitamin (MULTIVITAMIN) tablet Take 1 tablet by mouth daily.    [provider]  multivitamin-lutein (OCUVITE-LUTEIN) CAPS capsule Take 1 capsule by mouth daily.  [provider]    Allergies Quetiapine  Family History  Problem Relation Age of Onset   CVA Mother    CAD Father    Prostate cancer Neg Hx    Kidney cancer Neg Hx    Bladder Cancer Neg Hx     Social History Social History   Tobacco Use   Smoking status: Former    Pack years: 0.00    Types: Cigarettes    Quit date: 02/22/1975    Years since quitting: 45.5   Smokeless tobacco: Never  Vaping Use   Vaping Use: Never used  Substance Use Topics   Alcohol use: No    Alcohol/week: 0.0 standard drinks   Drug use: Never    Review of Systems  Review of Systems  Constitutional:  Negative for  chills and fever.  HENT:  Negative for sore throat.   Eyes:  Negative for pain.  Respiratory:  Negative for cough and stridor.   Cardiovascular:  Negative for chest pain.  Gastrointestinal:  Negative for vomiting.  Genitourinary:  Negative for dysuria.  Musculoskeletal:  Negative for myalgias.  Skin:  Negative for rash.  Neurological:  Positive for weakness. Negative for seizures, loss of consciousness and headaches.  Psychiatric/Behavioral:  Positive for depression (.). Negative for suicidal ideas.   All other systems reviewed and are negative.    ____________________________________________   PHYSICAL EXAM:  VITAL SIGNS: ED Triage Vitals  Enc Vitals Group     BP 08/18/20 1443 105/64     Pulse Rate 08/18/20 1443 81     Resp 08/18/20 1443 20     Temp 08/18/20 1443 97.8 F (36.6 C)     Temp Source 08/18/20 1443 Oral     SpO2 08/18/20 1443 95 %     Weight 08/18/20 1446 135 lb (61.2 kg)     Height 08/18/20 1446 5' 9" (1.753 m)     Head Circumference --      Peak Flow --      Pain Score 08/18/20 1445 5     Pain Loc --      Pain Edu? --      Excl. in [place]? --    Vitals:   08/18/20 1443 08/18/20 1634  BP: 105/64 102/72  Pulse: 81 78  Resp: 20 18  Temp: 97.8 F (36.6 C)   SpO2: 95% 96%   Physical Exam Vitals and nursing note reviewed.  Constitutional:      Appearance: He is well-developed.  HENT:     Head: Normocephalic and atraumatic.     Right Ear: External ear normal.     Left Ear: External ear normal.     Nose: Nose normal.     Mouth/Throat:     Mouth: Mucous membranes are moist.  Eyes:     Conjunctiva/sclera: Conjunctivae normal.  Cardiovascular:     Rate and Rhythm: Normal rate and regular rhythm.     Heart sounds: No murmur heard. Pulmonary:     Effort: Pulmonary effort is normal. No respiratory distress.     Breath sounds: Normal breath sounds.  Abdominal:     Palpations: Abdomen is soft.     Tenderness: There is no abdominal tenderness.   Musculoskeletal:     Cervical back: Neck supple.  Skin:    General: Skin is warm and dry.     Capillary Refill: Capillary refill takes less than 2 seconds.  Neurological:     Mental Status: He is alert and oriented to person, place,  and time.     Cranial Nerves: No cranial nerve deficit.     Motor: Weakness (L hip) present.  Psychiatric:        Mood and Affect: Mood is depressed.        Thought Content: Thought content does not include homicidal or suicidal ideation.     ____________________________________________   LABS (all labs ordered are listed, but only abnormal results are displayed)  Labs Reviewed  BASIC METABOLIC PANEL - Abnormal; Notable for the following components:      Result Value   Glucose, Bld 115 (*)    All other components within normal limits  URINALYSIS, COMPLETE (UACMP) WITH MICROSCOPIC - Abnormal; Notable for the following components:   Color, Urine AMBER (*)    APPearance CLOUDY (*)    Hgb urine dipstick MODERATE (*)    Ketones, ur 5 (*)    All other components within normal limits  TSH - Abnormal; Notable for the following components:   TSH 0.299 (*)    All other components within normal limits  CBC  HEPATIC FUNCTION PANEL  MAGNESIUM  T4, FREE   ____________________________________________  EKG  Significant artifact in V2 and V3 with normal sinus rhythm with a rate of 76, normal axis, otherwise unremarkable intervals without evidence of acute ischemia or significant Arrhythmia. ____________________________________________  RADIOLOGY  ED MD interpretation:    Official radiology report(s): No results found.  ____________________________________________   PROCEDURES  Procedure(s) performed (including Critical Care):  .1-3 Lead EKG Interpretation  Date/Time: 08/18/2020 5:16 PM Performed by: Lucrezia Starch, MD Authorized by: Lucrezia Starch, MD     Interpretation: normal     ECG rate assessment: normal     Rhythm: sinus  rhythm     Ectopy: none     Conduction: normal     ____________________________________________   INITIAL IMPRESSION / ASSESSMENT AND PLAN / ED COURSE      Patient presents with above-stated history exam for assessment of several weeks of low appetite and feeling depressed although adamantly not suicidal in the setting of chronic pain and disability from injury in his left leg.  He feels his pain is much better controlled but he still been feeling down and is not sure if it is affecting his appetite or not.  No change in his chronic pain or other acute associated symptoms.  He states it is all related to his appetite is negative with his actual ability to eat.  On arrival he is afebrile and hemodynamically stable.  His reassuring exam.  No history of recent trauma or findings on history or exam to suggest acute infectious process.  ECG shows no evidence of clear arrhythmia or ischemia.  CBC shows no evidence of anemia explain patient's low energy decreased mood and appetite.  Hepatic function panel unremarkable.  Magnesium is unremarkable.  Urinalysis is no evidence of urinary tract infection.  TSH is low and advised patient and wife at bedside this could be contributing to his low energy decreased mood and to follow this up with his PCP.  Therapy amenable to this.  Also advised that he should seek help with a therapist or psychiatrist to get assistance with his depression I do not feel he needs to be IVC or undergo emergent evaluation at this time.  They are amenable to seeking follow-up with [facility].  Overall low suspicion for immediate life-threatening process.  Discharged in stable condition.  Strict return precautions advised and discussed.      ____________________________________________  FINAL CLINICAL IMPRESSION(S) / ED DIAGNOSES  Final diagnoses:  Decreased appetite  Depression, unspecified depression type  Other chronic pain    Medications - No data to display   ED Discharge  Orders     None        Note:  This document was prepared using Dragon voice recognition software and may include unintentional dictation errors.    Lucrezia Starch, MD 08/18/20 [phone]

## 2020-09-21 ENCOUNTER — Ambulatory Visit (HOSPITAL_COMMUNITY)
Admission: EM | Admit: 2020-09-21 | Discharge: 2020-09-21 | Disposition: A | Discharge status: Home / Self Care | Payer: Medicare HMO | Attending: Registered Nurse | Admitting: Registered Nurse

## 2020-09-21 ENCOUNTER — Other Ambulatory Visit: Payer: Self-pay

## 2020-09-21 ENCOUNTER — Encounter (HOSPITAL_COMMUNITY): Payer: Self-pay | Admitting: Registered Nurse

## 2020-09-21 DIAGNOSIS — F32A Depression, unspecified: Secondary | ICD-10-CM | POA: Diagnosis present

## 2020-09-21 DIAGNOSIS — Z8659 Personal history of other mental and behavioral disorders: Secondary | ICD-10-CM | POA: Insufficient documentation

## 2020-09-21 DIAGNOSIS — F4323 Adjustment disorder with mixed anxiety and depressed mood: Secondary | ICD-10-CM | POA: Diagnosis not present
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
## 2020-09-22 ENCOUNTER — Telehealth (HOSPITAL_COMMUNITY): Payer: Self-pay | Admitting: Psychiatry

## 2020-09-22 NOTE — Telephone Encounter (Signed)
D:  David Rankin, NP referred pt to [facility].  A:  Placed call to pt but there was no answer.  Left vm for pt to call the case manager back re: group.  Inform David.

## 2020-09-25 DIAGNOSIS — R531 Weakness: Secondary | ICD-10-CM | POA: Diagnosis not present

## 2020-09-25 DIAGNOSIS — R2681 Unsteadiness on feet: Secondary | ICD-10-CM | POA: Diagnosis not present

## 2020-09-25 DIAGNOSIS — F331 Major depressive disorder, recurrent, moderate: Secondary | ICD-10-CM | POA: Diagnosis not present

## 2020-09-25 DIAGNOSIS — R634 Abnormal weight loss: Secondary | ICD-10-CM | POA: Diagnosis not present

## 2020-09-25 DIAGNOSIS — R102 Pelvic and perineal pain: Secondary | ICD-10-CM | POA: Diagnosis not present

## 2020-09-29 DIAGNOSIS — F319 Bipolar disorder, unspecified: Secondary | ICD-10-CM | POA: Diagnosis not present

## 2020-09-29 DIAGNOSIS — N5201 Erectile dysfunction due to arterial insufficiency: Secondary | ICD-10-CM | POA: Diagnosis not present

## 2020-09-29 DIAGNOSIS — M19041 Primary osteoarthritis, right hand: Secondary | ICD-10-CM | POA: Diagnosis not present

## 2020-09-29 DIAGNOSIS — G3184 Mild cognitive impairment, so stated: Secondary | ICD-10-CM | POA: Diagnosis not present

## 2020-09-29 DIAGNOSIS — M21372 Foot drop, left foot: Secondary | ICD-10-CM | POA: Diagnosis not present

## 2020-09-29 DIAGNOSIS — I700 Atherosclerosis of aorta: Secondary | ICD-10-CM | POA: Diagnosis not present

## 2020-09-29 DIAGNOSIS — D649 Anemia, unspecified: Secondary | ICD-10-CM | POA: Diagnosis not present

## 2020-09-29 DIAGNOSIS — E538 Deficiency of other specified B group vitamins: Secondary | ICD-10-CM | POA: Diagnosis not present

## 2020-09-29 DIAGNOSIS — S76312D Strain of muscle, fascia and tendon of the posterior muscle group at thigh level, left thigh, subsequent encounter: Secondary | ICD-10-CM | POA: Diagnosis not present

## 2020-09-30 DIAGNOSIS — S76312D Strain of muscle, fascia and tendon of the posterior muscle group at thigh level, left thigh, subsequent encounter: Secondary | ICD-10-CM | POA: Diagnosis not present

## 2020-09-30 DIAGNOSIS — D649 Anemia, unspecified: Secondary | ICD-10-CM | POA: Diagnosis not present

## 2020-09-30 DIAGNOSIS — M21372 Foot drop, left foot: Secondary | ICD-10-CM | POA: Diagnosis not present

## 2020-09-30 DIAGNOSIS — M19041 Primary osteoarthritis, right hand: Secondary | ICD-10-CM | POA: Diagnosis not present

## 2020-09-30 DIAGNOSIS — F319 Bipolar disorder, unspecified: Secondary | ICD-10-CM | POA: Diagnosis not present

## 2020-09-30 DIAGNOSIS — E538 Deficiency of other specified B group vitamins: Secondary | ICD-10-CM | POA: Diagnosis not present

## 2020-09-30 DIAGNOSIS — N5201 Erectile dysfunction due to arterial insufficiency: Secondary | ICD-10-CM | POA: Diagnosis not present

## 2020-09-30 DIAGNOSIS — I700 Atherosclerosis of aorta: Secondary | ICD-10-CM | POA: Diagnosis not present

## 2020-09-30 DIAGNOSIS — G3184 Mild cognitive impairment, so stated: Secondary | ICD-10-CM | POA: Diagnosis not present

## 2020-10-01 DIAGNOSIS — G3184 Mild cognitive impairment, so stated: Secondary | ICD-10-CM | POA: Diagnosis not present

## 2020-10-01 DIAGNOSIS — M19041 Primary osteoarthritis, right hand: Secondary | ICD-10-CM | POA: Diagnosis not present

## 2020-10-01 DIAGNOSIS — I700 Atherosclerosis of aorta: Secondary | ICD-10-CM | POA: Diagnosis not present

## 2020-10-01 DIAGNOSIS — S76312D Strain of muscle, fascia and tendon of the posterior muscle group at thigh level, left thigh, subsequent encounter: Secondary | ICD-10-CM | POA: Diagnosis not present

## 2020-10-01 DIAGNOSIS — F319 Bipolar disorder, unspecified: Secondary | ICD-10-CM | POA: Diagnosis not present

## 2020-10-01 DIAGNOSIS — M21372 Foot drop, left foot: Secondary | ICD-10-CM | POA: Diagnosis not present

## 2020-10-01 DIAGNOSIS — D649 Anemia, unspecified: Secondary | ICD-10-CM | POA: Diagnosis not present

## 2020-10-01 DIAGNOSIS — E538 Deficiency of other specified B group vitamins: Secondary | ICD-10-CM | POA: Diagnosis not present

## 2020-10-01 DIAGNOSIS — N5201 Erectile dysfunction due to arterial insufficiency: Secondary | ICD-10-CM | POA: Diagnosis not present

## 2020-10-06 DIAGNOSIS — S76312D Strain of muscle, fascia and tendon of the posterior muscle group at thigh level, left thigh, subsequent encounter: Secondary | ICD-10-CM | POA: Diagnosis not present

## 2020-10-06 DIAGNOSIS — M21372 Foot drop, left foot: Secondary | ICD-10-CM | POA: Diagnosis not present

## 2020-10-06 DIAGNOSIS — E538 Deficiency of other specified B group vitamins: Secondary | ICD-10-CM | POA: Diagnosis not present

## 2020-10-06 DIAGNOSIS — D649 Anemia, unspecified: Secondary | ICD-10-CM | POA: Diagnosis not present

## 2020-10-06 DIAGNOSIS — G3184 Mild cognitive impairment, so stated: Secondary | ICD-10-CM | POA: Diagnosis not present

## 2020-10-06 DIAGNOSIS — M19041 Primary osteoarthritis, right hand: Secondary | ICD-10-CM | POA: Diagnosis not present

## 2020-10-06 DIAGNOSIS — F319 Bipolar disorder, unspecified: Secondary | ICD-10-CM | POA: Diagnosis not present

## 2020-10-06 DIAGNOSIS — N5201 Erectile dysfunction due to arterial insufficiency: Secondary | ICD-10-CM | POA: Diagnosis not present

## 2020-10-06 DIAGNOSIS — I700 Atherosclerosis of aorta: Secondary | ICD-10-CM | POA: Diagnosis not present

## 2020-10-15 DIAGNOSIS — M21372 Foot drop, left foot: Secondary | ICD-10-CM | POA: Diagnosis not present

## 2020-10-15 DIAGNOSIS — M19041 Primary osteoarthritis, right hand: Secondary | ICD-10-CM | POA: Diagnosis not present

## 2020-10-15 DIAGNOSIS — G3184 Mild cognitive impairment, so stated: Secondary | ICD-10-CM | POA: Diagnosis not present

## 2020-10-15 DIAGNOSIS — N5201 Erectile dysfunction due to arterial insufficiency: Secondary | ICD-10-CM | POA: Diagnosis not present

## 2020-10-15 DIAGNOSIS — I700 Atherosclerosis of aorta: Secondary | ICD-10-CM | POA: Diagnosis not present

## 2020-10-15 DIAGNOSIS — S76312D Strain of muscle, fascia and tendon of the posterior muscle group at thigh level, left thigh, subsequent encounter: Secondary | ICD-10-CM | POA: Diagnosis not present

## 2020-10-15 DIAGNOSIS — E538 Deficiency of other specified B group vitamins: Secondary | ICD-10-CM | POA: Diagnosis not present

## 2020-10-15 DIAGNOSIS — D649 Anemia, unspecified: Secondary | ICD-10-CM | POA: Diagnosis not present

## 2020-10-15 DIAGNOSIS — F319 Bipolar disorder, unspecified: Secondary | ICD-10-CM | POA: Diagnosis not present

## 2020-10-26 DIAGNOSIS — G3184 Mild cognitive impairment, so stated: Secondary | ICD-10-CM | POA: Diagnosis not present

## 2020-10-26 DIAGNOSIS — E538 Deficiency of other specified B group vitamins: Secondary | ICD-10-CM | POA: Diagnosis not present

## 2020-10-26 DIAGNOSIS — I700 Atherosclerosis of aorta: Secondary | ICD-10-CM | POA: Diagnosis not present

## 2020-10-26 DIAGNOSIS — F319 Bipolar disorder, unspecified: Secondary | ICD-10-CM | POA: Diagnosis not present

## 2020-10-26 DIAGNOSIS — S76312D Strain of muscle, fascia and tendon of the posterior muscle group at thigh level, left thigh, subsequent encounter: Secondary | ICD-10-CM | POA: Diagnosis not present

## 2020-10-26 DIAGNOSIS — M19041 Primary osteoarthritis, right hand: Secondary | ICD-10-CM | POA: Diagnosis not present

## 2020-10-26 DIAGNOSIS — D649 Anemia, unspecified: Secondary | ICD-10-CM | POA: Diagnosis not present

## 2020-10-26 DIAGNOSIS — N5201 Erectile dysfunction due to arterial insufficiency: Secondary | ICD-10-CM | POA: Diagnosis not present

## 2020-10-26 DIAGNOSIS — M21372 Foot drop, left foot: Secondary | ICD-10-CM | POA: Diagnosis not present

## 2021-01-12 DIAGNOSIS — E559 Vitamin D deficiency, unspecified: Secondary | ICD-10-CM | POA: Diagnosis not present

## 2021-01-12 DIAGNOSIS — G3184 Mild cognitive impairment, so stated: Secondary | ICD-10-CM | POA: Diagnosis not present

## 2021-01-12 DIAGNOSIS — D72819 Decreased white blood cell count, unspecified: Secondary | ICD-10-CM | POA: Diagnosis not present

## 2021-01-12 DIAGNOSIS — F319 Bipolar disorder, unspecified: Secondary | ICD-10-CM | POA: Diagnosis not present

## 2021-01-12 DIAGNOSIS — R634 Abnormal weight loss: Secondary | ICD-10-CM | POA: Diagnosis not present

## 2021-01-12 DIAGNOSIS — G5793 Unspecified mononeuropathy of bilateral lower limbs: Secondary | ICD-10-CM | POA: Diagnosis not present

## 2021-01-12 DIAGNOSIS — N5201 Erectile dysfunction due to arterial insufficiency: Secondary | ICD-10-CM | POA: Diagnosis not present

## 2021-01-12 DIAGNOSIS — N200 Calculus of kidney: Secondary | ICD-10-CM | POA: Diagnosis not present

## 2021-01-12 DIAGNOSIS — E538 Deficiency of other specified B group vitamins: Secondary | ICD-10-CM | POA: Diagnosis not present

## 2021-03-12 IMAGING — MR MR OF THE LEFT FEMUR WITHOUT CONTRAST
5 series · 39 of 40 positions shown · non-contrast
Comparison: None.

CLINICAL DATA: Depression, bed ridden for 2 years.  Weakness.

EXAM:
MR OF THE LEFT FEMUR WITHOUT CONTRAST
TECHNIQUE: Multiplanar, multisequence MR imaging of the left femur was
performed. No intravenous contrast was administered.

[Series 6: composed cor t1_comp · coronal · left · 5.6mm · 1.08mm/px · 6 of 34 slices shown]
[im 1/34]
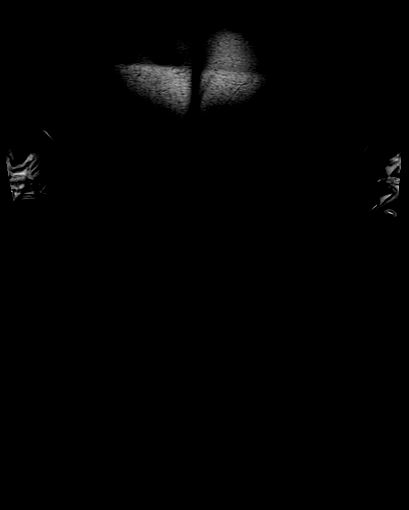
[im 7/34]
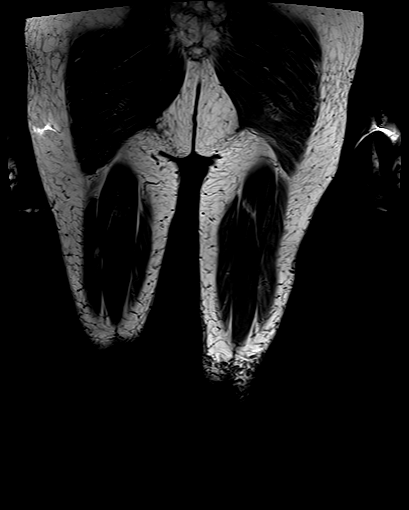
[im 14/34]
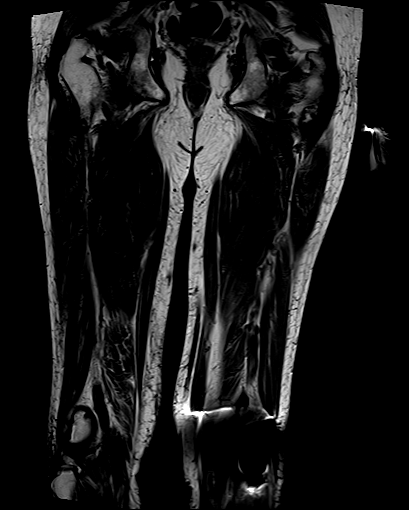
[im 20/34]
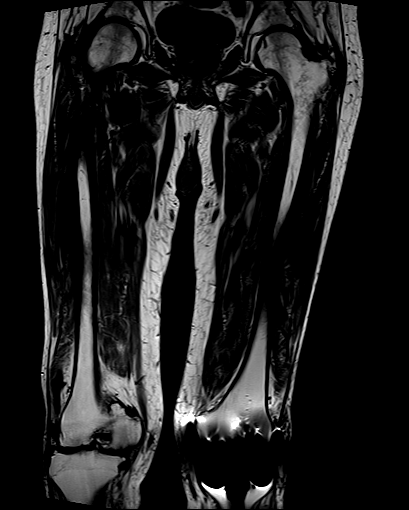
[im 27/34]
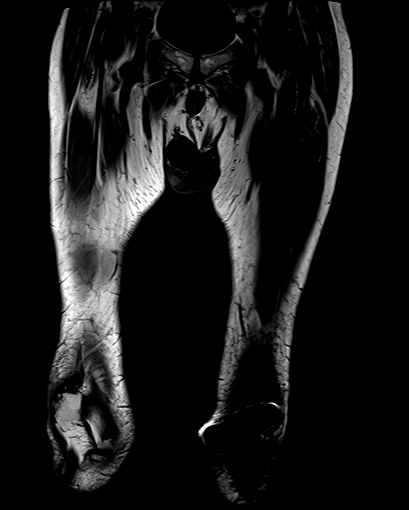
[im 34/34]
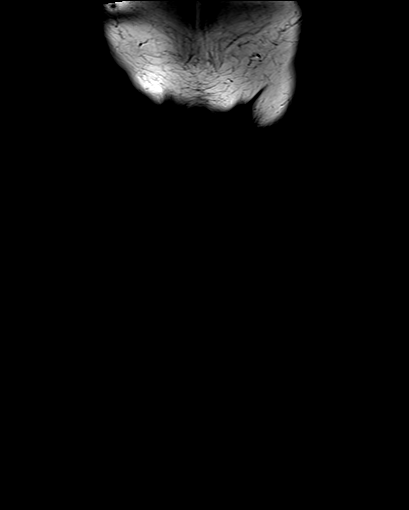

[Series 10: composed cor stir_comp · coronal · left · 5.6mm · 1.08mm/px · 5 of 34 slices shown]
[im 1/34]
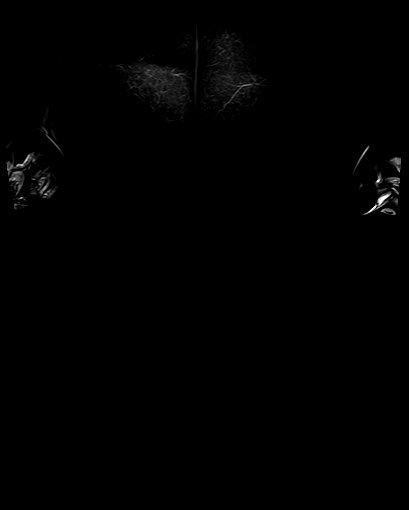
[im 9/34]
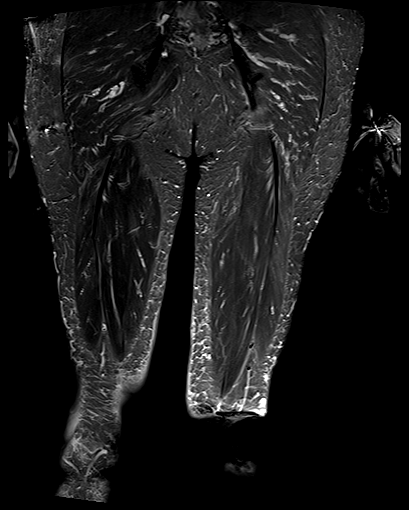
[im 17/34]
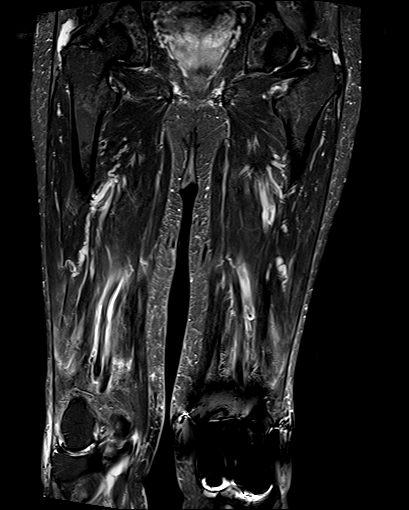
[im 25/34]
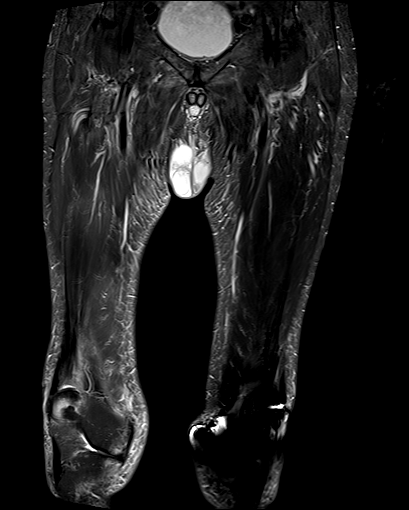
[im 34/34]
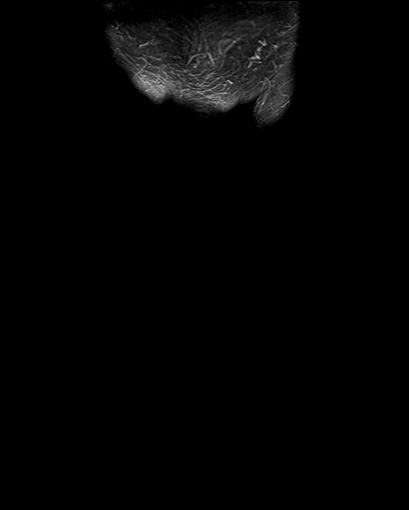

[Series 14: ax t1_comp · axial · left · 5.0mm · 0.86mm/px · z∈[-241,+275]mm · 12 of 86 slices shown]
[im 1/86]
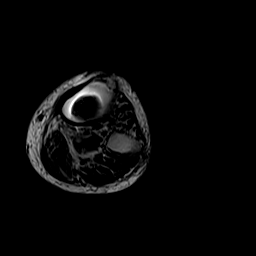
[im 8/86]
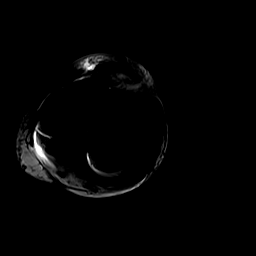
[im 16/86]
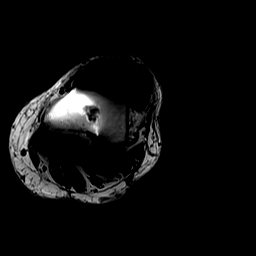
[im 24/86]
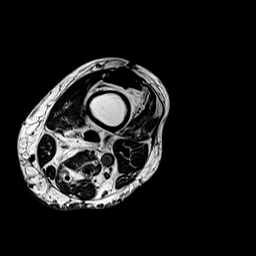
[im 31/86]
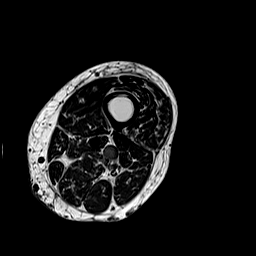
[im 39/86]
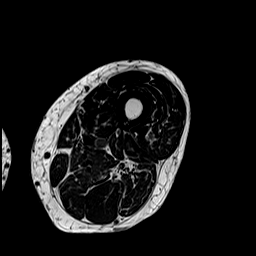
[im 47/86]
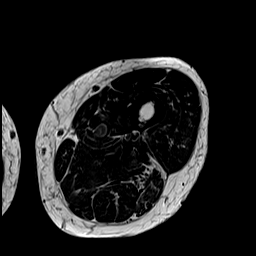
[im 55/86]
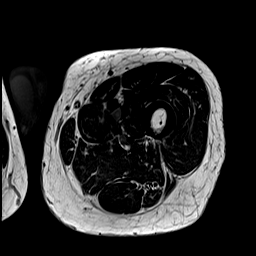
[im 62/86]
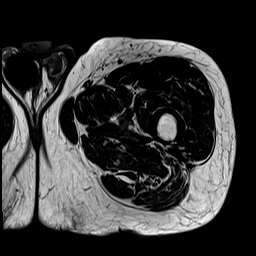
[im 70/86]
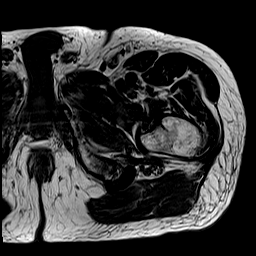
[im 78/86]
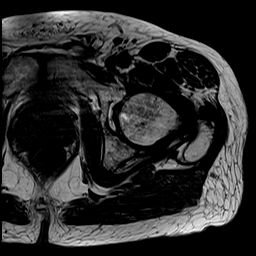
[im 86/86]
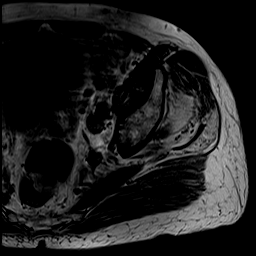

[Series 17: T2 · axial · left · 5.0mm · 0.86mm/px · z∈[-241,+275]mm · 11 of 86 slices shown]
[im 1/86]
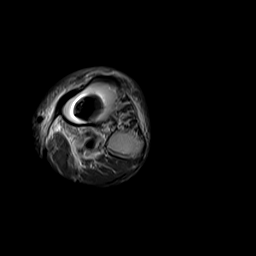
[im 8/86]
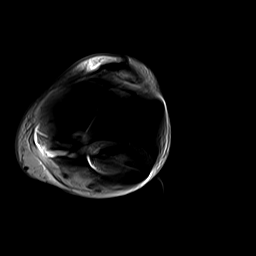
[im 16/86]
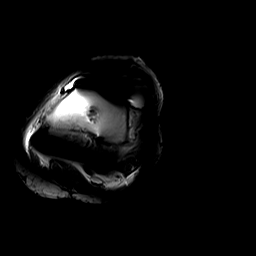
[im 24/86]
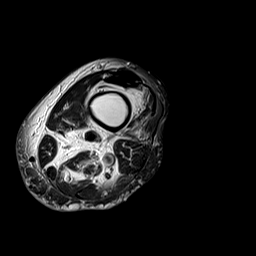
[im 31/86]
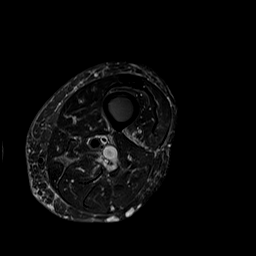
[im 39/86]
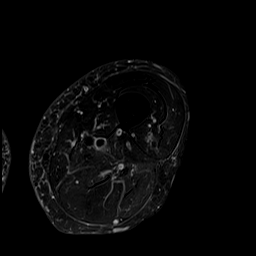
[im 47/86]
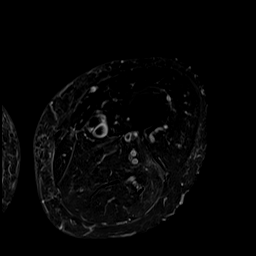
[im 62/86]
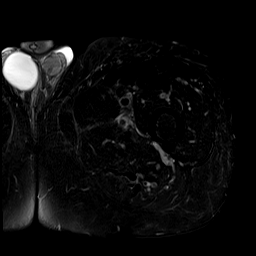
[im 70/86]
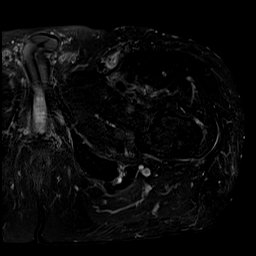
[im 78/86]
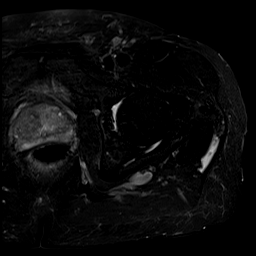
[im 86/86]
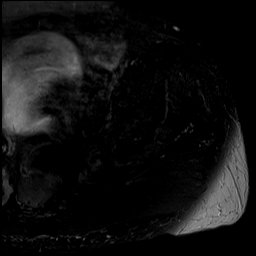

[Series 20: t2_tse_sag fs_comp · sagittal · left · 5.6mm · 0.94mm/px · 5 of 36 slices shown]
[im 1/36]
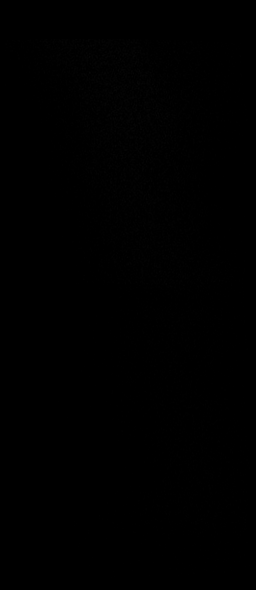
[im 9/36]
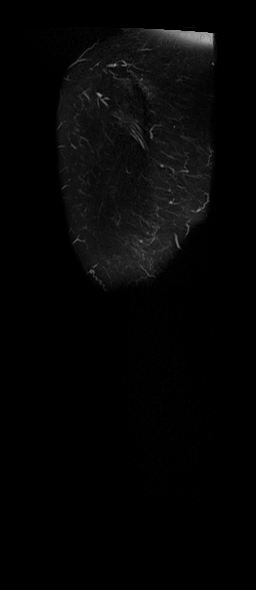
[im 18/36]
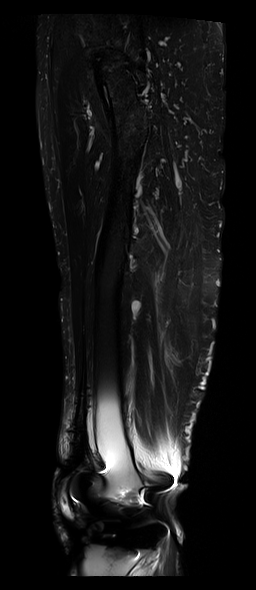
[im 27/36]
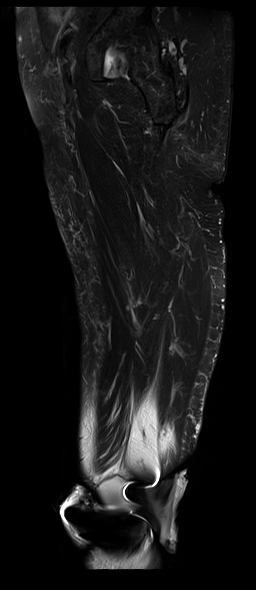
[im 36/36]
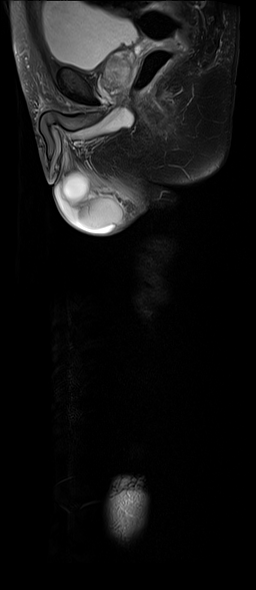

[39 of 40 positions shown; findings below may reference images not displayed]

FINDINGS: Bones/Joint/Cartilage

Left total knee arthroplasty with susceptibility artifact partially
obscuring the adjacent soft tissue and osseous structures.

No acute fracture or dislocation. No aggressive osseous lesion. No
periosteal reaction or bone destruction.

Partial-thickness cartilage loss of bilateral hips. High-grade
partial-thickness cartilage loss with areas of full-thickness
cartilage loss of the medial and lateral femorotibial compartments
of the right knee with subchondral marrow edema in the periphery of
the medial femorotibial compartment.

Normal alignment.  Moderate right knee joint effusion.

Ligaments

Intact.

Muscles and Tendons
No significant muscle atrophy of bilateral thighs. No muscle edema.
No intramuscular fluid collection or hematoma. Intact quadriceps
tendon and patellar tendon.

Soft tissue
No fluid collection or hematoma. No soft tissue mass. Moderate
amount of fluid in the right greater trochanteric bursa. Small
amount of fluid in the left greater trochanteric bursa.
IMPRESSION: 1. No evidence of myositis of thighs. No significant muscle atrophy
or muscle edema.
2. Severe osteoarthritis of medial and lateral femorotibial
compartments of the right knee.
3. Moderate right greater trochanteric bursitis. Mild left greater
trochanteric bursitis.

## 2021-06-10 ENCOUNTER — Emergency Department: Payer: Medicare HMO

## 2021-06-10 ENCOUNTER — Observation Stay
Admission: EM | Admit: 2021-06-10 | Discharge: 2021-06-17 | Disposition: A | Discharge status: Skilled Nursing Facility | Payer: Medicare HMO | Attending: Internal Medicine | Admitting: Internal Medicine

## 2021-06-10 DIAGNOSIS — F3170 Bipolar disorder, currently in remission, most recent episode unspecified: Secondary | ICD-10-CM | POA: Diagnosis not present

## 2021-06-10 DIAGNOSIS — K7689 Other specified diseases of liver: Secondary | ICD-10-CM | POA: Diagnosis not present

## 2021-06-10 DIAGNOSIS — Z79899 Other long term (current) drug therapy: Secondary | ICD-10-CM | POA: Diagnosis not present

## 2021-06-10 DIAGNOSIS — N401 Enlarged prostate with lower urinary tract symptoms: Secondary | ICD-10-CM

## 2021-06-10 DIAGNOSIS — Z96652 Presence of left artificial knee joint: Secondary | ICD-10-CM | POA: Insufficient documentation

## 2021-06-10 DIAGNOSIS — Z87891 Personal history of nicotine dependence: Secondary | ICD-10-CM | POA: Insufficient documentation

## 2021-06-10 DIAGNOSIS — R634 Abnormal weight loss: Secondary | ICD-10-CM | POA: Insufficient documentation

## 2021-06-10 DIAGNOSIS — R5381 Other malaise: Secondary | ICD-10-CM | POA: Diagnosis not present

## 2021-06-10 DIAGNOSIS — N400 Enlarged prostate without lower urinary tract symptoms: Secondary | ICD-10-CM

## 2021-06-10 DIAGNOSIS — R110 Nausea: Secondary | ICD-10-CM | POA: Diagnosis not present

## 2021-06-10 DIAGNOSIS — K5641 Fecal impaction: Secondary | ICD-10-CM | POA: Diagnosis not present

## 2021-06-10 DIAGNOSIS — S0990XA Unspecified injury of head, initial encounter: Secondary | ICD-10-CM | POA: Diagnosis not present

## 2021-06-10 DIAGNOSIS — R338 Other retention of urine: Secondary | ICD-10-CM

## 2021-06-10 DIAGNOSIS — R531 Weakness: Secondary | ICD-10-CM | POA: Diagnosis not present

## 2021-06-10 DIAGNOSIS — R001 Bradycardia, unspecified: Secondary | ICD-10-CM | POA: Diagnosis not present

## 2021-06-10 DIAGNOSIS — R42 Dizziness and giddiness: Secondary | ICD-10-CM | POA: Diagnosis not present

## 2021-06-10 DIAGNOSIS — K5900 Constipation, unspecified: Principal | ICD-10-CM | POA: Diagnosis present

## 2021-06-10 DIAGNOSIS — R5383 Other fatigue: Secondary | ICD-10-CM | POA: Diagnosis not present

## 2021-06-10 DIAGNOSIS — N200 Calculus of kidney: Secondary | ICD-10-CM | POA: Diagnosis not present

## 2021-06-10 DIAGNOSIS — E440 Moderate protein-calorie malnutrition: Secondary | ICD-10-CM | POA: Diagnosis not present

## 2021-06-10 DIAGNOSIS — Z96611 Presence of right artificial shoulder joint: Secondary | ICD-10-CM | POA: Insufficient documentation

## 2021-06-10 DIAGNOSIS — R627 Adult failure to thrive: Secondary | ICD-10-CM | POA: Diagnosis present

## 2021-06-10 DIAGNOSIS — R0689 Other abnormalities of breathing: Secondary | ICD-10-CM | POA: Diagnosis not present

## 2021-06-10 DIAGNOSIS — R54 Age-related physical debility: Secondary | ICD-10-CM

## 2021-06-10 LAB — COMPREHENSIVE METABOLIC PANEL
ALT: 17 U/L (ref 0–44)
AST: 28 U/L (ref 15–41)
Albumin: 3.9 g/dL (ref 3.5–5.0)
Alkaline Phosphatase: 54 U/L (ref 38–126)
Anion gap: 8 (ref 5–15)
BUN: 28 mg/dL — ABNORMAL HIGH (ref 8–23)
CO2: 26 mmol/L (ref 22–32)
Calcium: 9.9 mg/dL (ref 8.9–10.3)
Chloride: 109 mmol/L (ref 98–111)
Creatinine, Ser: 1.13 mg/dL (ref 0.61–1.24)
GFR, Estimated: >60 mL/min (ref >60)
Glucose, Bld: 88 mg/dL (ref 70–99)
Potassium: 4.3 mmol/L (ref 3.5–5.1)
Sodium: 143 mmol/L (ref 135–145)
Total Bilirubin: 0.8 mg/dL (ref 0.3–1.2)
Total Protein: 6.3 g/dL — ABNORMAL LOW (ref 6.5–8.1)

## 2021-06-10 LAB — URINALYSIS, ROUTINE W REFLEX MICROSCOPIC
Bilirubin Urine: NEGATIVE
Glucose, UA: NEGATIVE mg/dL
Hgb urine dipstick: NEGATIVE
Ketones, ur: NEGATIVE mg/dL
Leukocytes,Ua: NEGATIVE
Nitrite: NEGATIVE
Protein, ur: NEGATIVE mg/dL
Specific Gravity, Urine: 1.014 (ref 1.005–1.030)
pH: 7 (ref 5.0–8.0)

## 2021-06-10 LAB — CBC WITH DIFFERENTIAL/PLATELET
Abs Immature Granulocytes: 0 10*3/uL (ref 0.00–0.07)
Basophils Absolute: 0 10*3/uL (ref 0.0–0.1)
Basophils Relative: 0 %
Eosinophils Absolute: 0 10*3/uL (ref 0.0–0.5)
Eosinophils Relative: 1 %
HCT: 38.4 % — ABNORMAL LOW (ref 39.0–52.0)
Hemoglobin: 12.2 g/dL — ABNORMAL LOW (ref 13.0–17.0)
Immature Granulocytes: 0 %
Lymphocytes Relative: 54 %
Lymphs Abs: 1.5 10*3/uL (ref 0.7–4.0)
MCH: 33.6 pg (ref 26.0–34.0)
MCHC: 31.8 g/dL (ref 30.0–36.0)
MCV: 105.8 fL — ABNORMAL HIGH (ref 80.0–100.0)
Monocytes Absolute: 0.3 10*3/uL (ref 0.1–1.0)
Monocytes Relative: 11 %
Neutro Abs: 1 10*3/uL — ABNORMAL LOW (ref 1.7–7.7)
Neutrophils Relative %: 34 %
Platelets: 154 10*3/uL (ref 150–400)
RBC: 3.63 MIL/uL — ABNORMAL LOW (ref 4.22–5.81)
RDW: 15.2 % (ref 11.5–15.5)
WBC: 2.9 10*3/uL — ABNORMAL LOW (ref 4.0–10.5)
nRBC: 0 % (ref 0.0–0.2)

## 2021-06-10 MED ORDER — LACTATED RINGERS IV BOLUS
1000.0000 mL | Freq: Once | INTRAVENOUS | Status: AC
  Administered 2021-06-10: 1000 mL via INTRAVENOUS

## 2021-06-10 MED ORDER — IOHEXOL 300 MG/ML  SOLN
100.0000 mL | Freq: Once | INTRAMUSCULAR | Status: AC | PRN
  Administered 2021-06-10: 100 mL via INTRAVENOUS

## 2021-06-10 MED ORDER — POLYETHYLENE GLYCOL 3350 17 G PO PACK
17.0000 g | PACK | Freq: Every day | ORAL | Status: DC
  Administered 2021-06-10: 17 g via ORAL
  Filled 2021-06-10: qty 1

## 2021-06-10 NOTE — ED Notes (Signed)
Pt ambulatory to bathroom with minimal assist. X 1 large formed BM ?

## 2021-06-10 NOTE — ED Triage Notes (Signed)
Pt to ED via EMS from home ; lives with wife. Children concerned about pt, Pt apparently has not been eating much over the past year and more weak than normal. Reports last BM 2 days ago. Last VS : 121/70, hr 60, 97%. CBG 93. CE:YEMVVK stone. Orientation at baseline.  ?

## 2021-06-10 NOTE — ED Provider Notes (Signed)
? ?[facility] ?Provider Note ? ? ? Event Date/Time  ? First MD Initiated Contact with Patient 06/10/21 1918   ?  (approximate) ? ? ?History  ? ?Failure To Thrive ? ? ?HPI ? ?David [name] is a 81 y.o. male with a history of GERD, bipolar disorder who comes the ED due to severe generalized weakness.  Patient reports frequent urges to have a bowel movement but unable to despite lots of pressure and pain.  He avoids eating due to the discomfort.  He has become very weak, essentially bedbound, and his wife does not like him to keep water by the bedside so he has minimal oral intake. ? ?He reports lower abdominal pain.  Nonradiating.  No fever.  He does get very lightheaded with standing and has had multiple falls at home recently.  Family also reports 40 pound weight loss over the past several months. ?  ? ? ?Physical Exam  ? ?Triage Vital Signs: ?ED Triage Vitals  ?Enc Vitals Group  ?   BP 06/10/21 1921 121/70  ?   Pulse Rate 06/10/21 1921 65  ?   Resp 06/10/21 1921 20  ?   Temp 06/10/21 1921 98.5 ?F (36.9 ?C)  ?   Temp Source 06/10/21 1921 Oral  ?   SpO2 06/10/21 1923 95 %  ?   Weight 06/10/21 1920 128 lb 14.4 oz (58.5 kg)  ?   Height 06/10/21 1920 '5\' 9"'$  (1.753 m)  ?   Head Circumference --   ?   Peak Flow --   ?   Pain Score 06/10/21 1919 2  ?   Pain Loc --   ?   Pain Edu? --   ?   Excl. in [place]? --   ? ? ?Most recent vital signs: ?Vitals:  ? 06/10/21 2015 06/10/21 2138  ?BP: 121/66 121/73  ?Pulse: (!) 54 (!) 49  ?Resp:  17  ?Temp:    ?SpO2: 97% 100%  ? ? ? ?General: Awake, no distress.  Dry mucous membranes ?CV:  Good peripheral perfusion.  Regular rate and rhythm ?Resp:  Normal effort.  Clear to auscultation bilaterally ?Abd:  No distention.  Soft, nontender.  There is lower abdominal fullness. ?Other:  No lower extremity edema, no inflammatory soft tissue changes.  No visible signs of head trauma. ? ? ?ED Results / Procedures / Treatments  ? ?Labs ?(all labs ordered are listed, but only  abnormal results are displayed) ?Labs Reviewed  ?COMPREHENSIVE METABOLIC PANEL - Abnormal; Notable for the following components:  ?    Result Value  ? BUN 28 (*)   ? Total Protein 6.3 (*)   ? All other components within normal limits  ?CBC WITH DIFFERENTIAL/PLATELET - Abnormal; Notable for the following components:  ? WBC 2.9 (*)   ? RBC 3.63 (*)   ? Hemoglobin 12.2 (*)   ? HCT 38.4 (*)   ? MCV 105.8 (*)   ? Neutro Abs 1.0 (*)   ? All other components within normal limits  ?URINALYSIS, ROUTINE W REFLEX MICROSCOPIC - Abnormal; Notable for the following components:  ? Color, Urine YELLOW (*)   ? APPearance CLOUDY (*)   ? All other components within normal limits  ? ? ? ?EKG ? ? ? ? ?RADIOLOGY ?CT abdomen pelvis viewed and interpreted by me, shows extensive constipation.  No intra-abdominal abscess or free air.  No large mass.  Radiology report reviewed ? ?Chest x-ray viewed and interpreted by me, unremarkable.  Radiology report reviewed. ? ?CT head unremarkable ? ?PROCEDURES: ? ?Critical Care performed: No ? ?Procedures ? ? ?MEDICATIONS ORDERED IN ED: ?Medications  ?polyethylene glycol (MIRALAX / GLYCOLAX) packet 17 g (17 g Oral Given 06/10/21 2146)  ?lactated ringers bolus 1,000 mL (1,000 mLs Intravenous New Bag/Given 06/10/21 2117)  ?iohexol (OMNIPAQUE) 300 MG/ML solution 100 mL (100 mLs Intravenous Contrast Given 06/10/21 2102)  ? ? ? ?IMPRESSION / MDM / ASSESSMENT AND PLAN / ED COURSE  ?I reviewed the triage vital signs and the nursing notes. ?             ?               ? ?Differential diagnosis includes, but is not limited to, intra-abdominal tumor, constipation, dehydration, electrolyte abnormality, intracranial hemorrhage, pneumonia, UTI ? ? ?Patient presents with subacute to chronic constipation resulting in food avoidance and significant malnutrition and dehydration for which he has become symptomatic.  This is severely limiting his ability to care for himself at home and putting him at risk of serious injury  due to multiple falls. ? ?CT head is unremarkable.  CT abdomen pelvis shows signs of extensive constipation with fecal impaction.  MiraLAX and soapsuds enema ordered.  We will need to hospitalize for further management until symptomatically improved and able to function with support at home. ? ?  ? ? ?FINAL CLINICAL IMPRESSION(S) / ED DIAGNOSES  ? ?Final diagnoses:  ?Constipation, unspecified constipation type  ?Orthostatic dizziness  ?Weight loss  ? ? ? ?Rx / DC Orders  ? ?ED Discharge Orders   ? ? None  ? ?  ? ? ? ?Note:  This document was prepared using Dragon voice recognition software and may include unintentional dictation errors. ?  ?Carrie Mew, MD ?06/10/21 2215 ? ?

## 2021-06-10 NOTE — ED Notes (Signed)
Pt offered ordered enema, does not want right now, informs this RN will want at a later time. Will continue to monitor.  ?

## 2021-06-10 NOTE — ED Notes (Signed)
Patient transported to CT 

## 2021-06-10 NOTE — ED Notes (Signed)
Hospitalist at bedside 

## 2021-06-11 ENCOUNTER — Encounter: Payer: Self-pay | Admitting: Family Medicine

## 2021-06-11 ENCOUNTER — Other Ambulatory Visit: Payer: Self-pay

## 2021-06-11 DIAGNOSIS — K5641 Fecal impaction: Secondary | ICD-10-CM

## 2021-06-11 DIAGNOSIS — N400 Enlarged prostate without lower urinary tract symptoms: Secondary | ICD-10-CM

## 2021-06-11 DIAGNOSIS — F3170 Bipolar disorder, currently in remission, most recent episode unspecified: Secondary | ICD-10-CM | POA: Diagnosis not present

## 2021-06-11 LAB — CBC
HCT: 33.7 % — ABNORMAL LOW (ref 39.0–52.0)
Hemoglobin: 10.9 g/dL — ABNORMAL LOW (ref 13.0–17.0)
MCH: 33.9 pg (ref 26.0–34.0)
MCHC: 32.3 g/dL (ref 30.0–36.0)
MCV: 104.7 fL — ABNORMAL HIGH (ref 80.0–100.0)
Platelets: 154 10*3/uL (ref 150–400)
RBC: 3.22 MIL/uL — ABNORMAL LOW (ref 4.22–5.81)
RDW: 15.3 % (ref 11.5–15.5)
WBC: 3.2 10*3/uL — ABNORMAL LOW (ref 4.0–10.5)
nRBC: 0 % (ref 0.0–0.2)

## 2021-06-11 LAB — BASIC METABOLIC PANEL
Anion gap: 6 (ref 5–15)
BUN: 26 mg/dL — ABNORMAL HIGH (ref 8–23)
CO2: 29 mmol/L (ref 22–32)
Calcium: 9.3 mg/dL (ref 8.9–10.3)
Chloride: 108 mmol/L (ref 98–111)
Creatinine, Ser: 0.99 mg/dL (ref 0.61–1.24)
GFR, Estimated: >60 mL/min (ref >60)
Glucose, Bld: 100 mg/dL — ABNORMAL HIGH (ref 70–99)
Potassium: 4.1 mmol/L (ref 3.5–5.1)
Sodium: 143 mmol/L (ref 135–145)

## 2021-06-11 MED ORDER — MAGNESIUM HYDROXIDE 400 MG/5ML PO SUSP: 30.0000 mL | Freq: Every day | ORAL | Status: DC | PRN

## 2021-06-11 MED ORDER — ENSURE ENLIVE PO LIQD
237.0000 mL | Freq: Two times a day (BID) | ORAL | Status: DC
  Administered 2021-06-12 – 2021-06-17 (×7): 237 mL via ORAL

## 2021-06-11 MED ORDER — VITAMIN B-12 1000 MCG PO TABS
1000.0000 ug | ORAL_TABLET | Freq: Every day | ORAL | Status: DC
  Administered 2021-06-11 – 2021-06-17 (×7): 1000 ug via ORAL
  Filled 2021-06-11 (×7): qty 1

## 2021-06-11 MED ORDER — FLEET ENEMA 7-19 GM/118ML RE ENEM: 1.0000 | ENEMA | Freq: Once | RECTAL | Status: DC | PRN

## 2021-06-11 MED ORDER — MIRTAZAPINE 15 MG PO TABS
7.5000 mg | ORAL_TABLET | Freq: Every day | ORAL | Status: DC
Start: 2021-06-11 — End: 2021-06-14
  Administered 2021-06-13: 7.5 mg via ORAL
  Filled 2021-06-11 (×2): qty 1

## 2021-06-11 MED ORDER — ADULT MULTIVITAMIN W/MINERALS CH
1.0000 | ORAL_TABLET | Freq: Every day | ORAL | Status: DC
  Administered 2021-06-11 – 2021-06-17 (×7): 1 via ORAL
  Filled 2021-06-11 (×7): qty 1

## 2021-06-11 MED ORDER — BISACODYL 10 MG RE SUPP
10.0000 mg | Freq: Every day | RECTAL | Status: DC | PRN
  Filled 2021-06-11: qty 1

## 2021-06-11 MED ORDER — ACETAMINOPHEN 325 MG PO TABS: 650.0000 mg | ORAL_TABLET | Freq: Four times a day (QID) | ORAL | Status: DC | PRN

## 2021-06-11 MED ORDER — TRAZODONE HCL 50 MG PO TABS: 25.0000 mg | ORAL_TABLET | Freq: Every evening | ORAL | Status: DC | PRN

## 2021-06-11 MED ORDER — SODIUM CHLORIDE 0.9 % IV SOLN: INTRAVENOUS | Status: DC

## 2021-06-11 MED ORDER — POLYETHYLENE GLYCOL 3350 17 G PO PACK: 17.0000 g | PACK | Freq: Every day | ORAL | Status: DC

## 2021-06-11 MED ORDER — POLYETHYLENE GLYCOL 3350 17 G PO PACK
17.0000 g | PACK | Freq: Every day | ORAL | Status: DC
  Administered 2021-06-11 – 2021-06-12 (×2): 17 g via ORAL
  Filled 2021-06-11 (×2): qty 1

## 2021-06-11 MED ORDER — ACETAMINOPHEN 650 MG RE SUPP: 650.0000 mg | Freq: Four times a day (QID) | RECTAL | Status: DC | PRN

## 2021-06-11 MED ORDER — TAMSULOSIN HCL 0.4 MG PO CAPS
0.4000 mg | ORAL_CAPSULE | Freq: Every day | ORAL | Status: DC
  Administered 2021-06-11 – 2021-06-17 (×7): 0.4 mg via ORAL
  Filled 2021-06-11 (×7): qty 1

## 2021-06-11 MED ORDER — ENOXAPARIN SODIUM 40 MG/0.4ML IJ SOSY
40.0000 mg | PREFILLED_SYRINGE | INTRAMUSCULAR | Status: DC
  Administered 2021-06-11 – 2021-06-17 (×4): 40 mg via SUBCUTANEOUS
  Filled 2021-06-11 (×6): qty 0.4

## 2021-06-11 MED ORDER — VITAMIN D3 25 MCG (1000 UNIT) PO TABS
2000.0000 [IU] | ORAL_TABLET | Freq: Every day | ORAL | Status: DC
  Administered 2021-06-11 – 2021-06-17 (×7): 2000 [IU] via ORAL
  Filled 2021-06-11 (×13): qty 2

## 2021-06-11 MED ORDER — OCUVITE-LUTEIN PO CAPS
1.0000 | ORAL_CAPSULE | Freq: Every day | ORAL | Status: DC
  Administered 2021-06-11: 1 via ORAL
  Filled 2021-06-11: qty 1

## 2021-06-11 NOTE — NC FL2 (Signed)
?[place] MEDICAID FL2 LEVEL OF CARE SCREENING TOOL  ?  ? ?IDENTIFICATION  ?Patient Name: ?David [name] Birthdate: 04-20-1940 Sex: male Admission Date (Current Location): ?06/10/2021  ?[place] and [place] Number: ? [place] ?  Facility and Address:  ?  ?     Provider Number: ?[id number]  ?Attending Physician Name and Address:  ?Barb Merino, MD ? Relative Name and Phone Number:  ?  ?   ?Current Level of Care: ?Hospital Recommended Level of Care: ?[place] Prior Approval Number: ?  ? ?Date Approved/Denied: ?  PASRR Number: ?pending ? ?Discharge Plan: ?SNF ?  ? ?Current Diagnoses: ?Patient Active Problem List  ? Diagnosis Date Noted  ? Fecal impaction ([place]) 06/11/2021  ? Bipolar affective disorder in remission ([place]) 06/11/2021  ? BPH (benign prostatic hyperplasia) 06/11/2021  ? Constipation 06/10/2021  ? Abnormal SPEP 10/19/2018  ? Lymphopenia 06/07/2016  ? Abnormal weight loss 06/07/2016  ? Community acquired pneumonia of left lower lobe of lung   ? Pneumonia of left lower lobe due to Streptococcus pneumoniae [facility])   ? Acute respiratory failure ([place])   ? Hypotension 02/29/2016  ? IDA (iron deficiency anemia) 06/19/2014  ? Leucopenia 06/19/2014  ? ? ?Orientation RESPIRATION BLADDER Height & Weight   ?  ?Self, Situation, Place ? Normal Continent Weight: 58.5 kg ?Height:  '5\' 9"'$  (175.3 cm)  ?BEHAVIORAL SYMPTOMS/MOOD NEUROLOGICAL BOWEL NUTRITION STATUS  ?    Continent Diet (Heart Healthy)  ?AMBULATORY STATUS COMMUNICATION OF NEEDS Skin   ?Total Care Verbally Normal ?  ?  ?  ?    ?     ?     ? ? ?Personal Care Assistance Level of Assistance  ?    ?  ?  ?   ? ?Functional Limitations Info  ?    ?  ?   ? ? ?SPECIAL CARE FACTORS FREQUENCY  ?PT (By licensed PT), OT (By licensed OT)   ?  ?  ?  ?  ?  ?  ?   ? ? ?Contractures Contractures Info: Not present  ? ? ?Additional Factors Info  ?Code Status, Allergies Code Status Info: Full ?Allergies Info: Quetiapine ?  ?  ?  ?   ? ?Current Medications (06/11/2021):   This is the current hospital active medication list ?Current Facility-Administered Medications  ?Medication Dose Route Frequency Provider Last Rate Last Admin  ? 0.9 %  sodium chloride infusion   Intravenous Continuous Mansy, Arvella Merles, MD   Stopped at 06/11/21 1412  ? acetaminophen (TYLENOL) tablet 650 mg  650 mg Oral Q6H PRN Mansy, Jan A, MD      ? Or  ? acetaminophen (TYLENOL) suppository 650 mg  650 mg Rectal Q6H PRN Mansy, Jan A, MD      ? bisacodyl (DULCOLAX) suppository 10 mg  10 mg Rectal Daily PRN Mansy, Jan A, MD      ? cholecalciferol (VITAMIN D) tablet 2,000 Units  2,000 Units Oral Daily Mansy, Arvella Merles, MD   2,000 Units at 06/11/21 0850  ? enoxaparin (LOVENOX) injection 40 mg  40 mg Subcutaneous Q24H Mansy, Jan A, MD   40 mg at 06/11/21 2683  ? magnesium hydroxide (MILK OF MAGNESIA) suspension 30 mL  30 mL Oral Daily PRN Mansy, Jan A, MD      ? mirtazapine (REMERON) tablet 7.5 mg  7.5 mg Oral QHS Patrecia Pour, NP      ? multivitamin with minerals tablet 1 tablet  1 tablet Oral Daily  Mansy, Arvella Merles, MD   1 tablet at 06/11/21 0902  ? multivitamin-lutein (OCUVITE-LUTEIN) capsule 1 capsule  1 capsule Oral Daily Mansy, Jan A, MD   1 capsule at 06/11/21 0850  ? polyethylene glycol (MIRALAX / GLYCOLAX) packet 17 g  17 g Oral Daily Barb Merino, MD   17 g at 06/11/21 0850  ? sodium phosphate (FLEET) 7-19 GM/118ML enema 1 enema  1 enema Rectal Once PRN Mansy, Jan A, MD      ? tamsulosin [facility]) capsule 0.4 mg  0.4 mg Oral Daily Mansy, Jan A, MD   0.4 mg at 06/11/21 4585  ? vitamin B-12 (CYANOCOBALAMIN) tablet 1,000 mcg  1,000 mcg Oral Daily Mansy, Jan A, MD   1,000 mcg at 06/11/21 9292  ? ? ? ?Discharge Medications: ?Please see discharge summary for a list of discharge medications. ? ?Relevant Imaging Results: ? ?Relevant Lab Results: ? ? ?Additional Information ?ss [phone] ? ?Beverly Sessions, RN ? ? ? ? ?

## 2021-06-11 NOTE — Evaluation (Signed)
Occupational Therapy Evaluation ?Patient Details ?Name: David [name] ?MRN: [id number] ?DOB: 09-Sep-1940 ?Today's Date: 06/11/2021 ? ? ?History of Present Illness Pt is an 81 y/o M admitted on 06/10/21 after presenting to the ED with c/o acute onset constipation with associated anorexia & diminished appetitie with subsequent 40 lbs weight loss & failure to thrive. Abdominal and pelvic CT scan revealed fecal impaction with rectal wall thickening compatible with stercoral colitis. PMH: bipolar disorder, GERD, OA, allergic rhinitis  ? ?Clinical Impression ?  ?Chart reviewed, RN cleared pt for participation in OT evaluation. Pt is alert and oriented x4, demonstrates fair safety awareness and good one step direction following. Pt daughter [name] present throughout evaluation. Pt and [name] both endorse progressive weakness over the last year. Pt does not endorse falls, while pt daughter reports pt has falling 3+ times in the last month. Pt reports MOD I with ADL PTA. Pt requires MIN A for LB dressing, CGA for ADL mobility approx 50 feet with RW. Verbal cues required throughout for appropriate, safe RW use. Pt presents with generalized weakness, decreased activity tolerance affecting safe and optimal ADL completion. At this time pt would benefit from STR to address functional deficits. Pt is left as received, NAD, all needs met. OT will continue to follow acutely.  ?   ? ?Recommendations for follow up therapy are one component of a multi-disciplinary discharge planning process, led by the attending physician.  Recommendations may be updated based on patient status, additional functional criteria and insurance authorization.  ? ?Follow Up Recommendations ? Skilled nursing-short term rehab (<3 hours/day)  ?  ?Assistance Recommended at Discharge Intermittent Supervision/Assistance  ?Patient can return home with the following A little help with walking and/or transfers;A little help with bathing/dressing/bathroom;Assistance with  cooking/housework;Assist for transportation ? ?  ?Functional Status Assessment ? Patient has had a recent decline in their functional status and demonstrates the ability to make significant improvements in function in a reasonable and predictable amount of time.  ?Equipment Recommendations ? BSC/3in1  ?  ?Recommendations for Other Services   ? ? ?  ?Precautions / Restrictions Precautions ?Precautions: Fall ?Restrictions ?Weight Bearing Restrictions: No  ? ?  ? ?Mobility Bed Mobility ?Overal bed mobility: Modified Independent ?  ?  ?  ?  ?  ?  ?  ?  ? ?Transfers ?Overall transfer level: Needs assistance ?Equipment used: Rolling walker (2 wheels) ?Transfers: Sit to/from Stand ?Sit to Stand: Min guard ?  ?  ?  ?  ?  ?  ?  ? ?  ?Balance Overall balance assessment: Needs assistance, History of Falls ?Sitting-balance support: Feet supported, Bilateral upper extremity supported ?Sitting balance-Leahy Scale: Good ?  ?  ?Standing balance support: Bilateral upper extremity supported, During functional activity ?Standing balance-Leahy Scale: Fair ?  ?  ?  ?  ?  ?  ?  ?  ?  ?  ?  ?  ?   ? ?ADL either performed or assessed with clinical judgement  ? ?ADL Overall ADL's : Needs assistance/impaired ?  ?  ?Grooming: Wash/dry hands;Standing;Min guard ?Grooming Details (indicate cue type and reason): sink level ?  ?  ?  ?  ?  ?  ?Lower Body Dressing: Minimal assistance;Sit to/from stand ?  ?Toilet Transfer: Min guard;Ambulation;Rolling walker (2 wheels) ?  ?  ?  ?  ?  ?Functional mobility during ADLs: Min guard;Rolling walker (2 wheels);Cueing for safety ?General ADL Comments: increased time for ADL task completion  ? ? ? ?Vision Patient  Visual Report: No change from baseline ?   ?   ?Perception   ?  ?Praxis   ?  ? ?Pertinent Vitals/Pain Pain Assessment ?Pain Assessment: No/denies pain  ? ? ? ?Hand Dominance   ?  ?Extremity/Trunk Assessment Upper Extremity Assessment ?Upper Extremity Assessment: Generalized weakness ?  ?Lower  Extremity Assessment ?Lower Extremity Assessment: Generalized weakness ?  ?  ?  ?Communication Communication ?Communication: No difficulties ?  ?Cognition Arousal/Alertness: Awake/alert ?Behavior During Therapy: [facility] for tasks assessed/performed ?Overall Cognitive Status: Difficult to assess ?  ?  ?  ?  ?  ?  ?  ?  ?  ?  ?  ?  ?  ?  ?  ?  ?General Comments: pt is alert and oriented to self, place, grossly oriented to situation, not oriented to date. Pt and daugther providing differing accounts of ADL/IADL performance and assist needed at home. ?  ?  ?General Comments  BP in supine 109/66, in sittting 105/67, standing 3 minutes 100/69, back to seated 106/54; spo2 >90% throughout ? ?  ?Exercises Other Exercises ?Other Exercises: edu pt and daugther re: role of rehab, role of OT, discharge recommendations, falls prevention, home safety ?  ?Shoulder Instructions    ? ? ?Home Living Family/patient expects to be discharged to:: Private residence ?Living Arrangements: Spouse/significant other ?Available Help at Discharge: Family ?Type of Home: House ?Home Access: Stairs to enter ?Entrance Stairs-Number of Steps: 5 ?Entrance Stairs-Rails: Left ?Home Layout: One level ?  ?  ?Bathroom Shower/Tub: Walk-in shower ?  ?Bathroom Toilet: Handicapped height ?  ?  ?  ?  ?Additional Comments: differing reports from pt and daugther [name])- pt reports BSC, use of grab bars in toilet and shower; pt daughter reports no BSC (refused when it arrived to house); pt reports MOD I with all ADL/IADL, daugther reports pt will get up to go to the bathrom and lay in bed much of the day. Pt reports use of shower chair.Pt daughter reports consistent functional decline over the last 10 months with greater than 3 falls this past month. Pt does follow one step directives with appropriate accuracy, multi step directions with increased vcs and time ?  ? ?  ?Prior Functioning/Environment   ?  ?  ?  ?  ?  ?  ? ?ADLs Comments: pt reports MOD I ?  ? ?  ?   ?OT Problem List: Decreased strength;Decreased activity tolerance;Impaired balance (sitting and/or standing) ?  ?   ?OT Treatment/Interventions: Self-care/ADL training;DME and/or AE instruction;Therapeutic activities;Balance training;Therapeutic exercise;Patient/family education  ?  ?OT Goals(Current goals can be found in the care plan section) Acute Rehab OT Goals ?Patient Stated Goal: go home ?OT Goal Formulation: With patient ?Time For Goal Achievement: 06/25/21 ?Potential to Achieve Goals: Good ?ADL Goals ?Pt Will Perform Grooming: with modified independence ?Pt Will Perform Lower Body Dressing: with modified independence ?Pt Will Transfer to Toilet: with modified independence ?Pt Will Perform Toileting - Clothing Manipulation and hygiene: with modified independence  ?OT Frequency: Min 2X/week ?  ? ?Co-evaluation   ?  ?  ?  ?  ? ?  ?AM-PAC OT "6 Clicks" Daily Activity     ?Outcome Measure Help from another person eating meals?: None ?Help from another person taking care of personal grooming?: None ?Help from another person toileting, which includes using toliet, bedpan, or urinal?: A Little ?Help from another person bathing (including washing, rinsing, drying)?: A Little ?Help from another person to put on and taking off regular  upper body clothing?: None ?Help from another person to put on and taking off regular lower body clothing?: None ?6 Click Score: 22 ?  ?End of Session Equipment Utilized During Treatment: Gait belt;Rolling walker (2 wheels) ?Nurse Communication: Mobility status ? ?Activity Tolerance: Patient tolerated treatment well ?Patient left: in bed;with call bell/phone within reach;with bed alarm set ? ?OT Visit Diagnosis: History of falling (Z91.81);Unsteadiness on feet (R26.81)  ?              ?Time: 3716-9678 ?OT Time Calculation (min): 30 min ?Charges:  OT General Charges ?$OT Visit: 1 Visit ?OT Evaluation ?$OT Eval Moderate Complexity: 1 Mod ?OT Treatments ?$Self Care/Home Management : 8-22  mins ? ?Shanon Payor, OTD OTR/L  ?06/11/21, 3:22 PM  ?

## 2021-06-11 NOTE — Progress Notes (Signed)
Initial Nutrition Assessment ? ?DOCUMENTATION CODES:  ? ?Non-severe (moderate) malnutrition in context of chronic illness ? ?INTERVENTION:  ? ?-Ensure Enlive po BID, each supplement provides 350 kcal and 20 grams of protein ?-MVI with minerals daily ?-Liberalize diet to regular  ? ?NUTRITION DIAGNOSIS:  ? ?Moderate Malnutrition related to chronic illness (bipolar disorder) as evidenced by energy intake < or equal to 75% for > or equal to 1 month, mild fat depletion, moderate fat depletion, mild muscle depletion, moderate muscle depletion. ? ?GOAL:  ? ?Patient will meet greater than or equal to 90% of their needs ? ?MONITOR:  ? ?PO intake, Supplement acceptance, Labs, Weight trends, Skin, I & O's ? ?REASON FOR ASSESSMENT:  ? ?Malnutrition Screening Tool ?  ? ?ASSESSMENT:  ? ?David [name] is a 81 y.o. Caucasian male with medical history significant for bipolar disorder, GERD, osteoarthritis and allergic rhinitis, who presented to the emergency room with acute onset of constipation for the last 10 months with associated anorexia and diminished appetite with subsequent 40 pounds weight loss and failure to thrive.  Patient denied any thyroid disease.  No fever or chills.  No chest pain or palpitations.  No nausea or vomiting diarrhea or abdominal pain.  When talking to his daughter separately they stated that the patient can be curling in bed and staying most of the day.  He would eat every other day.  He stated that everything tastes like salt.  He is afraid to eat as he states he cannot eliminate.  He denies any cough or wheezing or dyspnea. ? ?Pt admitted with constipation and fecal impaction.  ? ?Reviewed I/O's: -50 ml x 24 hours ? ?Spoke with pt at bedside, who reports decreased appetite over the past 3 months secondary to constipation. PTA pt was consuming 3 meals per day, which consisted of a meat, starch, and vegetables. Pt reports that he was afraid to eat due to discomfort of constipation; he tried a laxative  at home that was prescribed to him with no results, however, has been making consistent bowel movements here. Pt tolerating food well here; ate fruit and yogurt at lunch, but was unable to eat the mac and cheese ("it was too bland").  ? ?Pt reports UBW of around 180#. He estimates a 30# wt loss over the past 6 months. Reviewed wt hx; pt has experienced a 4.4% wt loss over the past 10 months, which is not significant for time frame.  ? ?Discussed importance of good meal and supplement intake to promote healing. Pt amenable to supplements. RD will also liberalize diet for widest variety of meal selections.  ?  ?Medications reviewed and include vitamin D, remeron, miralax, and vitamin B-12.  ? ?Labs reviewed: CBGS: 146 (inpatient orders for glycemic control are none).   ? ?NUTRITION - FOCUSED PHYSICAL EXAM: ? ?Flowsheet Row Most Recent Value  ?Orbital Region Mild depletion  ?Upper Arm Region Moderate depletion  ?Thoracic and Lumbar Region Mild depletion  ?Buccal Region Mild depletion  ?Temple Region Moderate depletion  ?Clavicle Bone Region Moderate depletion  ?Clavicle and Acromion Bone Region Moderate depletion  ?Scapular Bone Region Moderate depletion  ?Dorsal Hand Moderate depletion  ?Patellar Region Mild depletion  ?Anterior Thigh Region Mild depletion  ?Posterior Calf Region Mild depletion  ?Edema (RD Assessment) None  ?Hair Reviewed  ?Eyes Reviewed  ?Mouth Reviewed  ?Skin Reviewed  ?Nails Reviewed  ? ?  ? ? ?Diet Order:   ?Diet Order   ? ?       ?  Diet regular Room service appropriate? Yes; Fluid consistency: Thin  Diet effective now       ?  ? ?  ?  ? ?  ? ? ?EDUCATION NEEDS:  ? ?Education needs have been addressed ? ?Skin:  Skin Assessment: Reviewed RN Assessment ? ?Last BM:  06/11/21 ? ?Height:  ? ?Ht Readings from Last 1 Encounters:  ?06/10/21 _0  (1.753 m)  ? ? ?Weight:  ? ?Wt Readings from Last 1 Encounters:  ?06/10/21 58.5 kg  ? ? ?Ideal Body Weight:  72.7 kg ? ?BMI:  Body mass index is 19.04  kg/m?. ? ?Estimated Nutritional Needs:  ? ?Kcal:  1850-2050 ? ?Protein:  100-115 grams ? ?Fluid:  > 1.8 L ? ? ? ?Loistine Chance, RD, LDN, CDCES ?Registered Dietitian II ?Certified Diabetes Care and Education Specialist ?Please refer to [facility] for RD and/or RD on-call/weekend/after hours pager  ?

## 2021-06-11 NOTE — Assessment & Plan Note (Addendum)
Severe constipation with stercoral colitis.  Continue bowel regimen. ?

## 2021-06-11 NOTE — Care Management Obs Status (Signed)
MEDICARE OBSERVATION STATUS NOTIFICATION ? ? ?Patient Details  ?Name: David [name] ?MRN: [id number] ?Date of Birth: 07/19/1940 ? ? ?Medicare Observation Status Notification Given:  Yes ? ? ? ?Shelbie Hutching, RN ?06/11/2021, 2:31 PM ?

## 2021-06-11 NOTE — Assessment & Plan Note (Deleted)
Management as above °

## 2021-06-11 NOTE — Consult Note (Signed)
[facility] Face-to-Face Psychiatry Consult  ? ?Reason for Consult:  poor appetite, past history of depression ?Referring Physician:  Dr Sidney Ace ?Patient Identification: David [name] ?MRN:  [id number] ?Principal Diagnosis: Constipation ?Diagnosis:  Principal Problem: ?  Constipation ?Active Problems: ?  Bipolar affective disorder in remission [facility]) ?  Fecal impaction ([place]) ?  BPH (benign prostatic hyperplasia) ? ? ?Total Time spent with patient: 45 minutes ? ?Subjective:   ?David [name] is a 81 y.o. male patient admitted with GI blockage r/t constipation.  "I don't have depression.  I did a long time ago." ? ?HPI:  81 yo male admitted for abdominal pain related to a fecal impaction.  His family is concerned as he lost weight over the past year, poor appetite.  He reports not eating because he is avoiding BMs because they are painful.  Denies depression and no medication in a few years, history of bipolar d/o.  Last office visit for mental health per chart was in November, labeled adjustment disorder related to side effects of his medication per notes.  His sleep is "not good" related to his physical discomfort.  Mild to moderate anxiety related to medical issues at this time.  Denies suicidal/homicidal ideations, hallucinations, and substance abuse.  Offered mental health services and/or medications and he declined, does not feel he needs anything.  Recommend changing his Trazodone to Remeron for sleep and increase in appetite. ? ?Past Psychiatric History: bipolar d/o ? ?Risk to Self:  none ?Risk to Others:  none ?Prior Inpatient Therapy:  none ?Prior Outpatient Therapy:  PCP ? ?Past Medical History:  ?Past Medical History:  ?Diagnosis Date  ? Allergic rhinitis   ? Anemia   ? Bipolar disorder ([place])   ? Elevated PSA   ? had biopsy at [facility] around 2009-2010 negative for malignancy  ? Erectile dysfunction   ? GERD (gastroesophageal reflux disease)   ? Leucopenia   ? Osteoarthritis   ? in hands  ? Prostatitis   ? Sigmoid polyp   ?   ?Past Surgical History:  ?Procedure Laterality Date  ? COLONOSCOPY  10/20/12  ? HERNIA REPAIR    ? Left knee replacement    ? Right shoulder replacement    ? ?Family History:  ?Family History  ?Problem Relation Age of Onset  ? CVA Mother   ? CAD Father   ? Prostate cancer Neg Hx   ? Kidney cancer Neg Hx   ? Bladder Cancer Neg Hx   ? ?Family Psychiatric  History: none ?Social History:  ?Social History  ? ?Substance and Sexual Activity  ?Alcohol Use No  ? Alcohol/week: 0.0 standard drinks  ?   ?Social History  ? ?Substance and Sexual Activity  ?Drug Use Never  ?  ?Social History  ? ?Socioeconomic History  ? Marital status: Married  ?  Spouse name: Not on file  ? Number of children: Not on file  ? Years of education: Not on file  ? Highest education level: Not on file  ?Occupational History  ? Not on file  ?Tobacco Use  ? Smoking status: Former  ?  Types: Cigarettes  ?  Quit date: 02/22/1975  ?  Years since quitting: 46.3  ? Smokeless tobacco: Never  ?Vaping Use  ? Vaping Use: Never used  ?Substance and Sexual Activity  ? Alcohol use: No  ?  Alcohol/week: 0.0 standard drinks  ? Drug use: Never  ? Sexual activity: Yes  ?Other Topics Concern  ? Not on file  ?Social  History Narrative  ? Not on file  ? ?Social Determinants of Health  ? ?Financial Resource Strain: Not on file  ?Food Insecurity: Not on file  ?Transportation Needs: Not on file  ?Physical Activity: Not on file  ?Stress: Not on file  ?Social Connections: Not on file  ? ?Additional Social History: ?  ? ?Allergies:   ?Allergies  ?Allergen Reactions  ? Quetiapine Anxiety  ?  Restlessness, worsening anxiety  ?Restlessness, worsening anxiety   ? ? ?Labs:  ?Results for orders placed or performed during the hospital encounter of 06/10/21 (from the past 48 hour(s))  ?Comprehensive metabolic panel     Status: Abnormal  ? Collection Time: 06/10/21  7:25 PM  ?Result Value Ref Range  ? Sodium 143 135 - 145 mmol/L  ? Potassium 4.3 3.5 - 5.1 mmol/L  ? Chloride 109 98 - 111  mmol/L  ? CO2 26 22 - 32 mmol/L  ? Glucose, Bld 88 70 - 99 mg/dL  ?  Comment: Glucose reference range applies only to samples taken after fasting for at least 8 hours.  ? BUN 28 (H) 8 - 23 mg/dL  ? Creatinine, Ser 1.13 0.61 - 1.24 mg/dL  ? Calcium 9.9 8.9 - 10.3 mg/dL  ? Total Protein 6.3 (L) 6.5 - 8.1 g/dL  ? Albumin 3.9 3.5 - 5.0 g/dL  ? AST 28 15 - 41 U/L  ? ALT 17 0 - 44 U/L  ? Alkaline Phosphatase 54 38 - 126 U/L  ? Total Bilirubin 0.8 0.3 - 1.2 mg/dL  ? GFR, Estimated >60 >60 mL/min  ?  Comment: (NOTE) ?Calculated using the CKD-EPI Creatinine Equation (2021) ?  ? Anion gap 8 5 - 15  ?  Comment: Performed at [facility], [address], [place], [place] [address]  ?CBC with Differential     Status: Abnormal  ? Collection Time: 06/10/21  7:25 PM  ?Result Value Ref Range  ? WBC 2.9 (L) 4.0 - 10.5 K/uL  ? RBC 3.63 (L) 4.22 - 5.81 MIL/uL  ? Hemoglobin 12.2 (L) 13.0 - 17.0 g/dL  ? HCT 38.4 (L) 39.0 - 52.0 %  ? MCV 105.8 (H) 80.0 - 100.0 fL  ? MCH 33.6 26.0 - 34.0 pg  ? MCHC 31.8 30.0 - 36.0 g/dL  ? RDW 15.2 11.5 - 15.5 %  ? Platelets 154 150 - 400 K/uL  ? nRBC 0.0 0.0 - 0.2 %  ? Neutrophils Relative % 34 %  ? Neutro Abs 1.0 (L) 1.7 - 7.7 K/uL  ? Lymphocytes Relative 54 %  ? Lymphs Abs 1.5 0.7 - 4.0 K/uL  ? Monocytes Relative 11 %  ? Monocytes Absolute 0.3 0.1 - 1.0 K/uL  ? Eosinophils Relative 1 %  ? Eosinophils Absolute 0.0 0.0 - 0.5 K/uL  ? Basophils Relative 0 %  ? Basophils Absolute 0.0 0.0 - 0.1 K/uL  ? Immature Granulocytes 0 %  ? Abs Immature Granulocytes 0.00 0.00 - 0.07 K/uL  ?  Comment: Performed at [facility], [address], [place], [place] [address]  ?Urinalysis, Routine w reflex microscopic     Status: Abnormal  ? Collection Time: 06/10/21  8:00 PM  ?Result Value Ref Range  ? Color, Urine YELLOW (A) YELLOW  ? APPearance CLOUDY (A) CLEAR  ? Specific Gravity, Urine 1.014 1.005 - 1.030  ? pH 7.0 5.0 - 8.0  ? Glucose, UA NEGATIVE NEGATIVE mg/dL  ? Hgb urine dipstick NEGATIVE  NEGATIVE  ? Bilirubin Urine NEGATIVE NEGATIVE  ?  Ketones, ur NEGATIVE NEGATIVE mg/dL  ? Protein, ur NEGATIVE NEGATIVE mg/dL  ? Nitrite NEGATIVE NEGATIVE  ? Leukocytes,Ua NEGATIVE NEGATIVE  ?  Comment: Performed at [facility], [address] [place], [place] [address]  ?Basic metabolic panel     Status: Abnormal  ? Collection Time: 06/11/21  5:00 AM  ?Result Value Ref Range  ? Sodium 143 135 - 145 mmol/L  ? Potassium 4.1 3.5 - 5.1 mmol/L  ? Chloride 108 98 - 111 mmol/L  ? CO2 29 22 - 32 mmol/L  ? Glucose, Bld 100 (H) 70 - 99 mg/dL  ?  Comment: Glucose reference range applies only to samples taken after fasting for at least 8 hours.  ? BUN 26 (H) 8 - 23 mg/dL  ? Creatinine, Ser 0.99 0.61 - 1.24 mg/dL  ? Calcium 9.3 8.9 - 10.3 mg/dL  ? GFR, Estimated >60 >60 mL/min  ?  Comment: (NOTE) ?Calculated using the CKD-EPI Creatinine Equation (2021) ?  ? Anion gap 6 5 - 15  ?  Comment: Performed at [facility], [address] [place], [place] [address]  ?CBC     Status: Abnormal  ? Collection Time: 06/11/21  5:00 AM  ?Result Value Ref Range  ? WBC 3.2 (L) 4.0 - 10.5 K/uL  ? RBC 3.22 (L) 4.22 - 5.81 MIL/uL  ? Hemoglobin 10.9 (L) 13.0 - 17.0 g/dL  ? HCT 33.7 (L) 39.0 - 52.0 %  ? MCV 104.7 (H) 80.0 - 100.0 fL  ? MCH 33.9 26.0 - 34.0 pg  ? MCHC 32.3 30.0 - 36.0 g/dL  ? RDW 15.3 11.5 - 15.5 %  ? Platelets 154 150 - 400 K/uL  ? nRBC 0.0 0.0 - 0.2 %  ?  Comment: Performed at [facility], [address], [place],  [address]  ? ? ?Current Facility-Administered Medications  ?Medication Dose Route Frequency Provider Last Rate Last Admin  ? 0.9 %  sodium chloride infusion   Intravenous Continuous Mansy, Jan A, MD 100 mL/hr at 06/11/21 0932 Rate Verify at 06/11/21 0932  ? acetaminophen (TYLENOL) tablet 650 mg  650 mg Oral Q6H PRN Mansy, Jan A, MD      ? Or  ? acetaminophen (TYLENOL) suppository 650 mg  650 mg Rectal Q6H PRN Mansy, Jan A, MD      ? bisacodyl (DULCOLAX) suppository 10 mg  10 mg  Rectal Daily PRN Mansy, Jan A, MD      ? cholecalciferol (VITAMIN D) tablet 2,000 Units  2,000 Units Oral Daily Mansy, Arvella Merles, MD   2,000 Units at 06/11/21 0850  ? enoxaparin (LOVENOX) injection 40 mg  40 mg Subcuta

## 2021-06-11 NOTE — ED Notes (Signed)
Patient refused soap suds enema. Pt states he had large bowel movement after Miralax. ?

## 2021-06-11 NOTE — H&P (Addendum)
?  ?  ?[facility] ? ? ?PATIENT NAME: David [name]   ? ?MR#:  [id number] ? ?DATE OF BIRTH:  08/25/1940 ? ?DATE OF ADMISSION:  06/10/2021 ? ?PRIMARY CARE PHYSICIAN: Leonel Ramsay, MD  ? ?Patient is coming from: Home ? ?REQUESTING/REFERRING PHYSICIAN: Brenton Grills, MD ? ?CHIEF COMPLAINT:  ? ?Chief Complaint  ?Patient presents with  ? Failure To Thrive  ? ? ?HISTORY OF PRESENT ILLNESS:  ?David [name] is a 81 y.o. Caucasian male with medical history significant for bipolar disorder, GERD, osteoarthritis and allergic rhinitis, who presented to the emergency room with acute onset of constipation for the last 10 months with associated anorexia and diminished appetite with subsequent 40 pounds weight loss and failure to thrive.  Patient denied any thyroid disease.  No fever or chills.  No chest pain or palpitations.  No nausea or vomiting diarrhea or abdominal pain.  When talking to his daughter separately they stated that the patient can be curling in bed and staying most of the day.  He would eat every other day.  He stated that everything tastes like salt.  He is afraid to eat as he states he cannot eliminate.  He denies any cough or wheezing or dyspnea. ? ?ED Course: When he came to the ER vital signs were within normal.  Labs revealed a BUN of 28 and CBC showed WBC of 2.9 with mild anemia and macrocytosis.  UA came back negative. ? ?Imaging: Abdominal and pelvic CT scan revealed fecal impaction with rectal wall thickening compatible with stercoral colitis.  He had significant fecal retention elsewhere within the colon consistent with constipation and nonobstructive left renal calculi measuring up to 5 mm in size, enlarged prostate and aortic atherosclerosis.  Noncontrasted head CT scan revealed mild age-related atrophy and chronic microvascular ischemic changes with no acute intracranial. ? ?The patient was given 1 L bolus of IV lactated Ringer and Miralax with subsequent BM.  He will be admitted to AN  observation medical bed for further evaluation and management ?PAST MEDICAL HISTORY:  ? ?Past Medical History:  ?Diagnosis Date  ? Allergic rhinitis   ? Anemia   ? Bipolar disorder ([place])   ? Elevated PSA   ? had biopsy at [facility] around 2009-2010 negative for malignancy  ? Erectile dysfunction   ? GERD (gastroesophageal reflux disease)   ? Leucopenia   ? Osteoarthritis   ? in hands  ? Prostatitis   ? Sigmoid polyp   ? ? ?PAST SURGICAL HISTORY:  ? ?Past Surgical History:  ?Procedure Laterality Date  ? COLONOSCOPY  10/20/12  ? HERNIA REPAIR    ? Left knee replacement    ? Right shoulder replacement    ? ? ?SOCIAL HISTORY:  ? ?Social History  ? ?Tobacco Use  ? Smoking status: Former  ?  Types: Cigarettes  ?  Quit date: 02/22/1975  ?  Years since quitting: 46.3  ? Smokeless tobacco: Never  ?Substance Use Topics  ? Alcohol use: No  ?  Alcohol/week: 0.0 standard drinks  ? ? ?FAMILY HISTORY:  ? ?Family History  ?Problem Relation Age of Onset  ? CVA Mother   ? CAD Father   ? Prostate cancer Neg Hx   ? Kidney cancer Neg Hx   ? Bladder Cancer Neg Hx   ? ? ?DRUG ALLERGIES:  ? ?Allergies  ?Allergen Reactions  ? Quetiapine Anxiety  ?  Restlessness, worsening anxiety  ?Restlessness, worsening anxiety   ? ? ?REVIEW OF SYSTEMS:  ? ?  ROS ?As per history of present illness. All pertinent systems were reviewed above. Constitutional, HEENT, cardiovascular, respiratory, GI, GU, musculoskeletal, neuro, psychiatric, endocrine, integumentary and hematologic systems were reviewed and are otherwise negative/unremarkable except for positive findings mentioned above in the HPI. ? ? ?MEDICATIONS AT HOME:  ? ?Prior to Admission medications   ?Medication Sig Start Date End Date Taking? Authorizing Provider  ?acetaminophen (TYLENOL) 500 MG tablet Take 500 mg by mouth every 8 (eight) hours as needed for moderate pain.    Yes [provider]  ?Cholecalciferol 25 MCG (1000 UT) capsule Take 2,000 Units by mouth daily.   Yes [provider]   ?cyanocobalamin 1000 MCG tablet Take 1,000 mcg by mouth daily.   Yes [provider]  ?Lutein 6 MG CAPS Take 1 capsule by mouth daily.   Yes [provider]  ?Multiple Vitamin (MULTIVITAMIN) tablet Take 1 tablet by mouth daily.   Yes [provider]  ?multivitamin-lutein (OCUVITE-LUTEIN) CAPS capsule Take 1 capsule by mouth daily.   Yes [provider]  ?tamsulosin (FLOMAX) 0.4 MG CAPS capsule Take 0.4 mg by mouth daily. 03/02/21  Yes [provider]  ? ?  ? ?VITAL SIGNS:  ?Blood pressure 109/60, pulse (!) 49, temperature 98.4 ?F (36.9 ?C), temperature source Oral, resp. rate 15, height '5\' 9"'$  (1.753 m), weight 58.5 kg, SpO2 96 %. ? ?PHYSICAL EXAMINATION:  ?Physical Exam ? ?GENERAL:  81 y.o.-year-old Caucasian male patient lying in the bed with no acute distress.  ?EYES: Pupils equal, round, reactive to light and accommodation. No scleral icterus. Extraocular muscles intact.  ?HEENT: Head atraumatic, normocephalic. Oropharynx and nasopharynx clear.  ?NECK:  Supple, no jugular venous distention. No thyroid enlargement, no tenderness.  ?LUNGS: Normal breath sounds bilaterally, no wheezing, rales,rhonchi or crepitation. No use of accessory muscles of respiration.  ?CARDIOVASCULAR: Regular rate and rhythm, S1, S2 normal. No murmurs, rubs, or gallops.  ?ABDOMEN: Soft, nondistended, nontender. Bowel sounds present. No organomegaly or mass.  ?EXTREMITIES: No pedal edema, cyanosis, or clubbing.  ?NEUROLOGIC: Cranial nerves II through XII are intact. Muscle strength 5/5 in all extremities. Sensation intact. Gait not checked.  ?PSYCHIATRIC: The patient is alert and oriented x 3.  Normal affect and good eye contact. ?SKIN: No obvious rash, lesion, or ulcer.  ? ?LABORATORY PANEL:  ? ?CBC ?Recent Labs  ?Lab 06/10/21 ?1925  ?WBC 2.9*  ?HGB 12.2*  ?HCT 38.4*  ?PLT 154   ? ?------------------------------------------------------------------------------------------------------------------ ? ?Chemistries  ?Recent Labs  ?Lab 06/10/21 ?1925  ?NA 143  ?K 4.3  ?CL 109  ?CO2 26  ?GLUCOSE 88  ?BUN 28*  ?CREATININE 1.13  ?CALCIUM 9.9  ?AST 28  ?ALT 17  ?ALKPHOS 54  ?BILITOT 0.8  ? ?------------------------------------------------------------------------------------------------------------------ ? ?Cardiac Enzymes ?No results for input(s): TROPONINI in the last 168 hours. ?------------------------------------------------------------------------------------------------------------------ ? ?RADIOLOGY:  ?CT Head Wo Contrast ? ?Result Date: 06/10/2021 ?CLINICAL DATA:  Head trauma. EXAM: CT HEAD WITHOUT CONTRAST TECHNIQUE: Contiguous axial images were obtained from the base of the skull through the vertex without intravenous contrast. RADIATION DOSE REDUCTION: This exam was performed according to the departmental dose-optimization program which includes automated exposure control, adjustment of the mA and/or kV according to patient size and/or use of iterative reconstruction technique. COMPARISON:  Head CT dated 01/31/2019. FINDINGS: Brain: Mild age-related atrophy and chronic microvascular ischemic changes. There is no acute intracranial hemorrhage. No mass effect or midline shift. No extra-axial fluid collection. Vascular: No hyperdense vessel or unexpected calcification. Skull: Normal. Negative for fracture or focal  lesion. Sinuses/Orbits: No acute finding. Other: None IMPRESSION: 1. No acute intracranial pathology. 2. Mild age-related atrophy and chronic microvascular ischemic changes. Electronically Signed   By: Anner Crete M.D.   On: 06/10/2021 21:18  ? ?CT ABDOMEN PELVIS W CONTRAST ? ?Result Date: 06/10/2021 ?CLINICAL DATA:  Weakness, decreased appetite, last bowel movement 2 days ago EXAM: CT ABDOMEN AND PELVIS WITH CONTRAST TECHNIQUE: Multidetector CT imaging of the abdomen and pelvis  was performed using the standard protocol following bolus administration of intravenous contrast. RADIATION DOSE REDUCTION: This exam was performed according to the departmental dose-optimization program which includes automated exposure control, adjustment of the mA and/or kV according to patient size and/or use of iterative re

## 2021-06-11 NOTE — ED Notes (Signed)
MD at bedside. 

## 2021-06-11 NOTE — TOC Initial Note (Signed)
Transition of Care (TOC) - Initial/Assessment Note  ? ? ?Patient Details  ?Name: David [name] ?MRN: [id number] ?Date of Birth: Oct 13, 1940 ? ?Transition of Care (TOC) CM/SW Contact:    ?Shelbie Hutching, RN ?Phone Number: ?06/11/2021, 2:52 PM ? ?Clinical Narrative:                 ?Patient placed under observation for constipation.  RNCM was able to speak with patient's daughter via phone, introduced self and explained role.  Patient is from home with his wife.  Per daughter patient has not been out of the home much or even out of his room much in the last 10 months.  He can walk, has a walker but does not use it, he has not been eating much at home.  They tried sending home health out at one time but the patient would refuse to come out of his room and refused to work with them.   ?PT and OT are recommending SNF, MD reports that patient is in agreement.  Family is in agreement.  RNCM will start bed search.   ? ?Expected Discharge Plan: [place] ?Barriers to Discharge: Continued Medical Work up ? ? ?Patient Goals and CMS Choice ?Patient states their goals for this hospitalization and ongoing recovery are:: short term rehab ?CMS Medicare.gov Compare Post Acute Care list provided to:: Patient ?Choice offered to / list presented to : Patient ? ?Expected Discharge Plan and Services ?Expected Discharge Plan: [place] ?  ?Discharge Planning Services: CM Consult ?Post Acute Care Choice: [place] ?Living arrangements for the past 2 months: [place] ?                ?DME Arranged: N/A ?DME Agency: NA ?  ?  ?  ?HH Arranged: NA ?[place] Agency: NA ?  ?  ?  ? ?Prior Living Arrangements/Services ?Living arrangements for the past 2 months: [place] ?Lives with:: Spouse ?Patient language and need for interpreter reviewed:: Yes ?Do you feel safe going back to the place where you live?: Yes      ?Need for Family Participation in Patient Care: Yes (Comment) ?Care giver  support system in place?: Yes (comment) (wife and daughters) ?Current home services: DME ([facility]) ?Criminal Activity/Legal Involvement Pertinent to Current Situation/Hospitalization: No - Comment as needed ? ?Activities of Daily Living ?Home Assistive Devices/Equipment: Eyeglasses ?ADL Screening (condition at time of admission) ?Patient's cognitive ability adequate to safely complete daily activities?: Yes ?Is the patient deaf or have difficulty hearing?: No ?Does the patient have difficulty seeing, even when wearing glasses/contacts?: No ?Does the patient have difficulty concentrating, remembering, or making decisions?: No ?Patient able to express need for assistance with ADLs?: Yes ?Does the patient have difficulty dressing or bathing?: No ?Independently performs ADLs?: Yes (appropriate for developmental age) ?Does the patient have difficulty walking or climbing stairs?: No ?Weakness of Legs: None ?Weakness of Arms/Hands: None ? ?Permission Sought/Granted ?Permission sought to share information with : Case Manager, Family Supports, Customer service manager ?Permission granted to share information with : Yes, Verbal Permission Granted ? Share Information with NAME: Terri Piedra ? Permission granted to share info w AGENCY: SNF's ? Permission granted to share info w Relationship: daughter ? Permission granted to share info w Contact Information: [phone] ? ?Emotional Assessment ?  ?Attitude/Demeanor/Rapport: Engaged ?Affect (typically observed): Accepting ?Orientation: : Oriented to Self, Oriented to Place, Oriented to  Time, Oriented to Situation ?Alcohol / Substance Use: Not Applicable ?Psych Involvement:  Yes (comment) ? ?Admission diagnosis:  Weight loss [R63.4] ?Constipation [K59.00] ?Orthostatic dizziness [R42] ?Constipation, unspecified constipation type [K59.00] ?Patient Active Problem List  ? Diagnosis Date Noted  ? Fecal impaction ([place]) 06/11/2021  ? Bipolar affective disorder in remission ([place])  06/11/2021  ? BPH (benign prostatic hyperplasia) 06/11/2021  ? Constipation 06/10/2021  ? Abnormal SPEP 10/19/2018  ? Lymphopenia 06/07/2016  ? Abnormal weight loss 06/07/2016  ? Community acquired pneumonia of left lower lobe of lung   ? Pneumonia of left lower lobe due to Streptococcus pneumoniae [facility])   ? Acute respiratory failure ([place])   ? Hypotension 02/29/2016  ? IDA (iron deficiency anemia) 06/19/2014  ? Leucopenia 06/19/2014  ? ?PCP:  Leonel Ramsay, MD ?Pharmacy:   ?[id number] [place] [address]?Phone: [phone]: [phone]? ? ? ? ?Social Determinants of Health (SDOH) Interventions ?  ? ?Readmission Risk Interventions ?   ? View : No data to display.  ?  ?  ?  ? ? ? ?

## 2021-06-11 NOTE — Evaluation (Addendum)
Physical Therapy Evaluation ?Patient Details ?Name: David [name] ?MRN: [id number] ?DOB: 14-Jan-1941 ?Today's Date: 06/11/2021 ? ?History of Present Illness ? Pt is an 81 y/o M admitted on 06/10/21 after presenting to the ED with c/o acute onset constipation with associated anorexia & diminished appetitie with subsequent 40 lbs weight loss & failure to thrive. Abdominal and pelvic CT scan revealed fecal impaction with rectal wall thickening compatible with stercoral colitis. PMH: bipolar disorder, GERD, OA, allergic rhinitis  ?Clinical Impression ? Pt seen for PT evaluation with pt received in bed with daughter [name]) in room. Pt is AxOx3, reporting he lives with his wife & is ambulatory with rollator in the home, QC outside of the home & still driving.  [name] instead reports pt has been lying in bed for the past 10 months, only getting OOB every other day & has had falls prior to admission. On this date, pt is able to complete bed mobility with mod I with HOB elevated & bed rails. Pt requires CGA for STS with cuing for safe hand placement & CGA for gait with RW. Pt is limited in gait by c/o feeling "woozy" so pt returned to room. BP 98/54 mmHg MAP 68 while sitting EOB. Pt would benefit from STR upon d/c to maximize independence with functional mobility, reduce fall risk, & decrease caregiver burden prior to return home. Will continue to follow pt acutely to address balance, endurance, and gait with LRAD. ?   ? ?Recommendations for follow up therapy are one component of a multi-disciplinary discharge planning process, led by the attending physician.  Recommendations may be updated based on patient status, additional functional criteria and insurance authorization. ? ?Follow Up Recommendations Skilled nursing-short term rehab (<3 hours/day) ? ?  ?Assistance Recommended at Discharge Frequent or constant Supervision/Assistance  ?Patient can return home with the following ? A little help with walking and/or transfers;A  little help with bathing/dressing/bathroom;Assistance with cooking/housework;Direct supervision/assist for financial management;Direct supervision/assist for medications management;Help with stairs or ramp for entrance;Assist for transportation ? ?  ?Equipment Recommendations None recommended by PT  ?Recommendations for Other Services ?   OT consult ?  ?Functional Status Assessment Patient has had a recent decline in their functional status and demonstrates the ability to make significant improvements in function in a reasonable and predictable amount of time.  ? ?  ?Precautions / Restrictions Precautions ?Precautions: Fall ?Restrictions ?Weight Bearing Restrictions: No  ? ?  ? ?Mobility ? Bed Mobility ?Overal bed mobility: Modified Independent ?  ?  ?  ?  ?  ?  ?General bed mobility comments: supine<>sit without physical assistance ?  ? ?Transfers ?Overall transfer level: Needs assistance ?Equipment used: Rolling walker (2 wheels) ?Transfers: Sit to/from Stand ?Sit to Stand: Min guard ?  ?  ?  ?  ?  ?General transfer comment: cuing for safe hand placement during STS with RW ?  ? ?Ambulation/Gait ?Ambulation/Gait assistance: Min guard ?Gait Distance (Feet): 35 Feet ?Assistive device: Rolling walker (2 wheels) ?Gait Pattern/deviations: Decreased step length - right, Decreased step length - left, Trunk flexed ?Gait velocity: decreased ?  ?  ?General Gait Details: Decreased awareness re: need to ambulate within base of AD especially when turning. ? ?Stairs ?  ?  ?  ?  ?  ? ?Wheelchair Mobility ?  ? ?Modified Rankin (Stroke Patients Only) ?  ? ?  ? ?Balance Overall balance assessment: Needs assistance, History of Falls ?Sitting-balance support: Feet supported, Bilateral upper extremity supported ?Sitting balance-Leahy Scale: Fair ?  ?  ?  Standing balance support: Bilateral upper extremity supported, During functional activity ?Standing balance-Leahy Scale: Poor ?  ?  ?  ?  ?  ?  ?  ?  ?  ?  ?  ?  ?   ? ? ? ?Pertinent  Vitals/Pain Pain Assessment ?Pain Assessment: No/denies pain  ? ? ?Home Living Family/patient expects to be discharged to:: Private residence ?Living Arrangements: Spouse/significant other ?Available Help at Discharge: Family ?Type of Home: House ?Home Access: Stairs to enter ?Entrance Stairs-Rails: Left ?Entrance Stairs-Number of Steps: 5 ?  ?Home Layout: One level ?  ?   ?  ?Prior Function   ?  ?  ?  ?  ?  ?  ?Mobility Comments: Daughter [name]) reports pt lies in bed & has done so for past ~10 months, reports he will only get OOB every other day & has had falls at home. (When PT asked how pt toilets [name] did not give direct answer, only stated "he doesn't"). Reports he was driving prior to admission. Pt reports he uses rollator for mobility in home, QC outside of the home. ?  ?  ? ? ?Hand Dominance  ?   ? ?  ?Extremity/Trunk Assessment  ? Upper Extremity Assessment ?Upper Extremity Assessment: Generalized weakness ?  ? ?Lower Extremity Assessment ?Lower Extremity Assessment: Generalized weakness ?  ? ?   ?Communication  ? Communication: No difficulties  ?Cognition Arousal/Alertness: Awake/alert ?Behavior During Therapy: [facility] for tasks assessed/performed ?Overall Cognitive Status: Difficult to assess ?  ?  ?  ?  ?  ?  ?  ?  ?  ?  ?  ?  ?  ?  ?  ?  ?General Comments: Daughter [name]) present & gives different PLOF compared to pt. Pt AxO to self, location, situation but not date. ?  ?  ? ?  ?General Comments   ? ?  ?Exercises    ? ?Assessment/Plan  ?  ?PT Assessment Patient needs continued PT services  ?PT Problem List Decreased strength;Cardiopulmonary status limiting activity;Decreased cognition;Decreased activity tolerance;Decreased mobility;Decreased balance;Decreased safety awareness;Decreased knowledge of precautions ? ?   ?  ?PT Treatment Interventions DME instruction;Therapeutic exercise;Gait training;Balance training;Stair training;Neuromuscular re-education;Functional mobility training;Cognitive  remediation;Therapeutic activities;Patient/family education   ? ?PT Goals (Current goals can be found in the Care Plan section)  ?Acute Rehab PT Goals ?Patient Stated Goal: go to rehab ?PT Goal Formulation: With family ?Time For Goal Achievement: 06/25/21 ?Potential to Achieve Goals: Good ? ?  ?Frequency Min 2X/week ?  ? ? ?Co-evaluation   ?  ?  ?  ?  ? ? ?  ?AM-PAC PT "6 Clicks" Mobility  ?Outcome Measure Help needed turning from your back to your side while in a flat bed without using bedrails?: None ?Help needed moving from lying on your back to sitting on the side of a flat bed without using bedrails?: A Little ?Help needed moving to and from a bed to a chair (including a wheelchair)?: A Little ?Help needed standing up from a chair using your arms (e.g., wheelchair or bedside chair)?: A Little ?Help needed to walk in hospital room?: A Little ?Help needed climbing 3-5 steps with a railing? : A Little ?6 Click Score: 19 ? ?  ?End of Session   ?Activity Tolerance: Treatment limited secondary to medical complications (Comment) ?Patient left: in bed;with call bell/phone within reach;with bed alarm set;with family/visitor present ?Nurse Communication: Mobility status ?PT Visit Diagnosis: Unsteadiness on feet (R26.81);Muscle weakness (generalized) (M62.81) ?  ? ?Time:  1916-6060 ?PT Time Calculation (min) (ACUTE ONLY): 18 min ? ? ?Charges:   PT Evaluation ?$PT Eval Low Complexity: 1 Low ?  ?  ?   ? ? ?Lavone Nian, PT, DPT ?06/11/21, 12:13 PM ? ? ?Waunita Schooner ?06/11/2021, 12:10 PM ? ?

## 2021-06-11 NOTE — ED Notes (Signed)
Pt eating at this time.

## 2021-06-11 NOTE — Assessment & Plan Note (Addendum)
-   Continue Flomax 

## 2021-06-11 NOTE — ED Notes (Signed)
Spoke with daughter and updated on pt's plan of care. ?

## 2021-06-11 NOTE — Assessment & Plan Note (Addendum)
Patient seen by psychiatry and they initially recommended trazodone and then switched over to Remeron.  Family concerned that he still has effects of Remeron in his system.  This medication was discontinued. ?

## 2021-06-11 NOTE — Progress Notes (Signed)
RE: Armed forces logistics/support/administrative officer ?Date of Birth: 01-Jun-1940 ?Date: 06/11/21 ?  ?  ?To Whom It May Concern: ?  ?Please be advised that the above-named patient will require a short-term nursing home stay - anticipated 30 days or less for rehabilitation and strengthening.  The plan is for return home  ?

## 2021-06-12 DIAGNOSIS — E440 Moderate protein-calorie malnutrition: Secondary | ICD-10-CM | POA: Insufficient documentation

## 2021-06-12 DIAGNOSIS — K5904 Chronic idiopathic constipation: Secondary | ICD-10-CM | POA: Diagnosis not present

## 2021-06-12 DIAGNOSIS — F3170 Bipolar disorder, currently in remission, most recent episode unspecified: Secondary | ICD-10-CM

## 2021-06-12 DIAGNOSIS — K5641 Fecal impaction: Secondary | ICD-10-CM

## 2021-06-12 MED ORDER — DOCUSATE SODIUM 100 MG PO CAPS
100.0000 mg | ORAL_CAPSULE | Freq: Two times a day (BID) | ORAL | Status: DC
  Administered 2021-06-12 (×2): 100 mg via ORAL
  Filled 2021-06-12 (×3): qty 1

## 2021-06-12 NOTE — Plan of Care (Signed)

## 2021-06-12 NOTE — Plan of Care (Signed)

## 2021-06-12 NOTE — Progress Notes (Signed)
?PROGRESS NOTE ? ? ? ?David [name]  JJH:[id number] DOB: 16-Jun-1940 DOA: 06/10/2021 ?PCP: Leonel Ramsay, MD  ? ? ?Brief Narrative:  ?81 year old with history of bipolar disorder, GERD, previous history of ECT therapy, chronic constipation brought to the emergency room with anorexia, diminished appetite and failure to thrive.  Apparently lives at home, does not mobilize and does not eat.  Does not eat with fear of making constipation worse.  In the emergency room, hemodynamically stable.  CT abdomen with significant fecal impaction, rectal wall thickening, nonobstructive renal calculi.  CT head with chronic vascular changes.  No acute changes.  Due to significant symptoms, he was admitted to the hospital. ? ? ?Assessment & Plan: ?  ?Constipation: Significant symptoms.  This is mostly due to his dietary habits, declining laxatives at home. ?MiraLAX 17 g once a day, started having bowel movement.  Continue. ?Add scheduled Colace.  Continue aggressive bowel regimen until he has multiple bowel movements, then he can go back on stool softener.  Regular diet.  Clinically improving. ? ?Bipolar disorder: Currently not on any treatment.  Seen by psychiatry.  Currently without any lethality.  He denies symptoms and denies need.  No indication for any acute psychiatric treatment at this time. ?Started on Remeron, encouraged to take. ? ?Frailty, debility, ambulatory dysfunction: Significantly debilitated and unsafe mobilization.  Referred to inpatient therapies in short-term rehab before going back home.  Waiting for bed. ? ?Patient is stable to transfer to SNF when bed is available. ? ? ?DVT prophylaxis: enoxaparin (LOVENOX) injection 40 mg Start: 06/11/21 0800 ? ? ?Code Status: Full code ?Family Communication: Daughter at the bedside 4/21 ?Disposition Plan: Status is: Observation ?The patient remains OBS appropriate and will d/c before 2 midnights. ?  ? ? ?Consultants:  ?Psychiatry ? ?Procedures:  ?None ? ?Antimicrobials:   ?None ? ? ?Subjective: ?Patient was seen and examined.  No overnight events.  He tells me that he does not need to take any mental health medications and agreed with him.  He had 3 formed stool since yesterday, less painful than at home.  He is agreeable to go to rehab. ? ?Objective: ?Vitals:  ? 06/11/21 1501 06/11/21 1948 06/12/21 0509 06/12/21 0810  ?BP:  118/73 104/63 105/66  ?Pulse:  95 66 (!) 59  ?Resp:  '18 20 18  '$ ?Temp:   98 ?F (36.7 ?C) 98.7 ?F (37.1 ?C)  ?TempSrc:   Oral Oral  ?SpO2: 95% 100% 97% 98%  ?Weight:      ?Height:      ? ? ?Intake/Output Summary (Last 24 hours) at 06/12/2021 1214 ?Last data filed at 06/12/2021 1024 ?Gross per 24 hour  ?Intake 1631.15 ml  ?Output 450 ml  ?Net 1181.15 ml  ? ?Filed Weights  ? 06/10/21 1920  ?Weight: 58.5 kg  ? ? ?Examination: ? ?General exam: Appears calm and comfortable  ?Flat affect.  Laying in bed.  On room air.  Looks comfortable.  Alert oriented x4.  Does not have any focal neurological deficits. ?Respiratory system: Clear to auscultation. Respiratory effort normal. ?Cardiovascular system: S1 & S2 heard, RRR.  ?Gastrointestinal system: Abdomen is nondistended, soft and nontender. No organomegaly or masses felt. Normal bowel sounds heard. ? ? ?Data Reviewed: I have personally reviewed following labs and imaging studies ? ?CBC: ?Recent Labs  ?Lab 06/10/21 ?1925 06/11/21 ?0500  ?WBC 2.9* 3.2*  ?NEUTROABS 1.0*  --   ?HGB 12.2* 10.9*  ?HCT 38.4* 33.7*  ?MCV 105.8* 104.7*  ?PLT 154 154  ? ?  Basic Metabolic Panel: ?Recent Labs  ?Lab 06/10/21 ?1925 06/11/21 ?0500  ?NA 143 143  ?K 4.3 4.1  ?CL 109 108  ?CO2 26 29  ?GLUCOSE 88 100*  ?BUN 28* 26*  ?CREATININE 1.13 0.99  ?CALCIUM 9.9 9.3  ? ?GFR: ?Estimated Creatinine Clearance: 47.6 mL/min (by C-G formula based on SCr of 0.99 mg/dL). ?Liver Function Tests: ?Recent Labs  ?Lab 06/10/21 ?1925  ?AST 28  ?ALT 17  ?ALKPHOS 54  ?BILITOT 0.8  ?PROT 6.3*  ?ALBUMIN 3.9  ? ?No results for input(s): LIPASE, AMYLASE in the last 168  hours. ?No results for input(s): AMMONIA in the last 168 hours. ?Coagulation Profile: ?No results for input(s): INR, PROTIME in the last 168 hours. ?Cardiac Enzymes: ?No results for input(s): CKTOTAL, CKMB, CKMBINDEX, TROPONINI in the last 168 hours. ?BNP (last 3 results) ?No results for input(s): PROBNP in the last 8760 hours. ?HbA1C: ?No results for input(s): HGBA1C in the last 72 hours. ?CBG: ?No results for input(s): GLUCAP in the last 168 hours. ?Lipid Profile: ?No results for input(s): CHOL, HDL, LDLCALC, TRIG, CHOLHDL, LDLDIRECT in the last 72 hours. ?Thyroid Function Tests: ?No results for input(s): TSH, T4TOTAL, FREET4, T3FREE, THYROIDAB in the last 72 hours. ?Anemia Panel: ?No results for input(s): VITAMINB12, FOLATE, FERRITIN, TIBC, IRON, RETICCTPCT in the last 72 hours. ?Sepsis Labs: ?No results for input(s): PROCALCITON, LATICACIDVEN in the last 168 hours. ? ?No results found for this or any previous visit (from the past 240 hour(s)).  ? ? ? ? ? ?Radiology Studies: ?CT Head Wo Contrast ? ?Result Date: 06/10/2021 ?CLINICAL DATA:  Head trauma. EXAM: CT HEAD WITHOUT CONTRAST TECHNIQUE: Contiguous axial images were obtained from the base of the skull through the vertex without intravenous contrast. RADIATION DOSE REDUCTION: This exam was performed according to the departmental dose-optimization program which includes automated exposure control, adjustment of the mA and/or kV according to patient size and/or use of iterative reconstruction technique. COMPARISON:  Head CT dated 01/31/2019. FINDINGS: Brain: Mild age-related atrophy and chronic microvascular ischemic changes. There is no acute intracranial hemorrhage. No mass effect or midline shift. No extra-axial fluid collection. Vascular: No hyperdense vessel or unexpected calcification. Skull: Normal. Negative for fracture or focal lesion. Sinuses/Orbits: No acute finding. Other: None IMPRESSION: 1. No acute intracranial pathology. 2. Mild age-related  atrophy and chronic microvascular ischemic changes. Electronically Signed   By: Anner Crete M.D.   On: 06/10/2021 21:18  ? ?CT ABDOMEN PELVIS W CONTRAST ? ?Result Date: 06/10/2021 ?CLINICAL DATA:  Weakness, decreased appetite, last bowel movement 2 days ago EXAM: CT ABDOMEN AND PELVIS WITH CONTRAST TECHNIQUE: Multidetector CT imaging of the abdomen and pelvis was performed using the standard protocol following bolus administration of intravenous contrast. RADIATION DOSE REDUCTION: This exam was performed according to the departmental dose-optimization program which includes automated exposure control, adjustment of the mA and/or kV according to patient size and/or use of iterative reconstruction technique. CONTRAST:  165m OMNIPAQUE IOHEXOL 300 MG/ML  SOLN COMPARISON:  09/04/2018 FINDINGS: Lower chest: No acute pleural or parenchymal lung disease. Hepatobiliary: Multiple small benign hepatic cysts, stable. No further imaging workup is required. Otherwise the liver is unremarkable. No biliary duct dilation. The gallbladder is unremarkable without cholelithiasis or cholecystitis. Pancreas: Unremarkable. No pancreatic ductal dilatation or surrounding inflammatory changes. Spleen: Normal in size without focal abnormality. Adrenals/Urinary Tract: There are multiple nonobstructing left renal calculi largest measuring 5 mm in the lower pole. No right-sided calculi. No obstructive uropathy within either kidney. Bilateral renal cortical thinning. The  adrenals and bladder are unremarkable. Stomach/Bowel: No bowel obstruction or ileus. There is a large amount of retained stool within the rectal vault, consistent with fecal impaction. Mild rectal wall thickening consistent with stercoral colitis. Significant fecal retention within the remainder of the colon consistent with constipation. No evidence of small-bowel obstruction. Vascular/Lymphatic: Aortic atherosclerosis. No enlarged abdominal or pelvic lymph nodes.  Reproductive: Enlarged prostate, stable. Other: No free fluid or free intraperitoneal gas. No abdominal wall hernia. Musculoskeletal: No acute or destructive bony lesions. Reconstructed images demonstrate no additional fi

## 2021-06-13 DIAGNOSIS — F3170 Bipolar disorder, currently in remission, most recent episode unspecified: Secondary | ICD-10-CM | POA: Diagnosis not present

## 2021-06-13 DIAGNOSIS — N400 Enlarged prostate without lower urinary tract symptoms: Secondary | ICD-10-CM | POA: Diagnosis not present

## 2021-06-13 DIAGNOSIS — K5904 Chronic idiopathic constipation: Secondary | ICD-10-CM | POA: Diagnosis not present

## 2021-06-13 DIAGNOSIS — E440 Moderate protein-calorie malnutrition: Secondary | ICD-10-CM | POA: Diagnosis not present

## 2021-06-13 DIAGNOSIS — K5641 Fecal impaction: Secondary | ICD-10-CM | POA: Diagnosis not present

## 2021-06-13 MED ORDER — GUAIFENESIN 100 MG/5ML PO LIQD
5.0000 mL | ORAL | Status: DC | PRN
Start: 2021-06-13 — End: 2021-06-17

## 2021-06-13 MED ORDER — METOPROLOL TARTRATE 5 MG/5ML IV SOLN: 5.0000 mg | INTRAVENOUS | Status: DC | PRN

## 2021-06-13 MED ORDER — DOCUSATE SODIUM 100 MG PO CAPS
200.0000 mg | ORAL_CAPSULE | Freq: Two times a day (BID) | ORAL | Status: DC
  Administered 2021-06-13 – 2021-06-16 (×6): 200 mg via ORAL
  Filled 2021-06-13 (×8): qty 2

## 2021-06-13 MED ORDER — SENNOSIDES-DOCUSATE SODIUM 8.6-50 MG PO TABS
1.0000 | ORAL_TABLET | Freq: Every evening | ORAL | Status: DC | PRN
  Administered 2021-06-13: 1 via ORAL
  Filled 2021-06-13: qty 1

## 2021-06-13 MED ORDER — HYDRALAZINE HCL 20 MG/ML IJ SOLN: 10.0000 mg | INTRAMUSCULAR | Status: DC | PRN

## 2021-06-13 MED ORDER — POLYETHYLENE GLYCOL 3350 17 G PO PACK
17.0000 g | PACK | Freq: Two times a day (BID) | ORAL | Status: DC
  Administered 2021-06-13 – 2021-06-16 (×7): 17 g via ORAL
  Filled 2021-06-13 (×7): qty 1

## 2021-06-13 NOTE — Plan of Care (Signed)

## 2021-06-13 NOTE — Progress Notes (Signed)
?PROGRESS NOTE ? ? ? ?David [name]  QMV:[id number] DOB: 04/03/1940 DOA: 06/10/2021 ?PCP: Leonel Ramsay, MD  ? ?Brief Narrative:  ?81 year old with history of bipolar disorder, GERD, previous history of ECT therapy, chronic constipation brought to the emergency room with anorexia, diminished appetite and failure to thrive.  Apparently lives at home, does not mobilize and does not eat.  Does not eat with fear of making constipation worse.  In the emergency room, hemodynamically stable.  CT abdomen with significant fecal impaction, rectal wall thickening, nonobstructive renal calculi.  CT head with chronic vascular changes.  No acute changes.  Due to significant symptoms, he was admitted to the hospital.  Patient has been started on aggressive bowel regimen.  PT/OT recommended SNF therefore awaiting placement.  During the hospitalization is also seen by psychiatry who has changed his trazodone to Remeron at bedtime ? ? ?Assessment & Plan: ? Principal Problem: ?  Constipation ?Active Problems: ?  Fecal impaction ([place]) ?  BPH (benign prostatic hyperplasia) ?  Bipolar affective disorder in remission [facility]) ?  Malnutrition of moderate degree ?  ?Severe constipation with stercoral colitis ?-Aggressive bowel regimen has been ordered. Tap water enema. We can also try SMOG. Patient refusing to take any bowel regimen at home prior to admission. ? ?Bipolar disorder/depression ?- Seen by psychiatry.  Trazodone switched to Remeron at bedtime ? ?Frail, debility and ambulatory dysfunction ?- PT/OT recommending  SNF, TOC team working on placement. ? ? ? ? ?DVT prophylaxis: Lovenox ?Code Status: Full code ?Family Communication:  Family at bedside ? ? ?Nutritional status ? ? ? ?Signs/Symptoms: energy intake < or equal to 75% for > or equal to 1 month, mild fat depletion, moderate fat depletion, mild muscle depletion, moderate muscle depletion ? ?Interventions: Ensure Enlive (each supplement provides 350kcal and 20 grams of  protein), MVI, Liberalize Diet ? ?Body mass index is 19.04 kg/m?. ? ?  ? ? ? ? ? ?Subjective: ?Patient had a good sized bowel movement yesterday evening and this morning.  He feels much better.  He understands that he needs to have at least 1-2 soft bowel movements daily ? ? ? ?Examination: ? ?General exam: Appears calm and comfortable  ?Respiratory system: Clear to auscultation. Respiratory effort normal. ?Cardiovascular system: S1 & S2 heard, RRR. No JVD, murmurs, rubs, gallops or clicks. No pedal edema. ?Gastrointestinal system: Abdomen is nondistended, soft and nontender. No organomegaly or masses felt. Normal bowel sounds heard. ?Central nervous system: Alert and oriented. No focal neurological deficits. ?Extremities: Symmetric 5 x 5 power. ?Skin: No rashes, lesions or ulcers ?Psychiatry: Judgement and insight appear normal. Mood & affect appropriate.  ? ? ? ?Objective: ?Vitals:  ? 06/12/21 0810 06/12/21 1621 06/12/21 2035 06/13/21 0554  ?BP: 105/66 111/67 99/74 131/67  ?Pulse: (!) 59 80 66 62  ?Resp: '18 18 18 20  '$ ?Temp: 98.7 ?F (37.1 ?C) 98.7 ?F (37.1 ?C) 98.4 ?F (36.9 ?C) 98 ?F (36.7 ?C)  ?TempSrc: Oral Oral  Oral  ?SpO2: 98% 99% 100% 98%  ?Weight:      ?Height:      ? ? ?Intake/Output Summary (Last 24 hours) at 06/13/2021 0818 ?Last data filed at 06/12/2021 1935 ?Gross per 24 hour  ?Intake 660 ml  ?Output 0 ml  ?Net 660 ml  ? ?Filed Weights  ? 06/10/21 1920  ?Weight: 58.5 kg  ? ? ? ?Data Reviewed:  ? ?CBC: ?Recent Labs  ?Lab 06/10/21 ?1925 06/11/21 ?0500  ?WBC 2.9* 3.2*  ?NEUTROABS 1.0*  --   ?  HGB 12.2* 10.9*  ?HCT 38.4* 33.7*  ?MCV 105.8* 104.7*  ?PLT 154 154  ? ?Basic Metabolic Panel: ?Recent Labs  ?Lab 06/10/21 ?1925 06/11/21 ?0500  ?NA 143 143  ?K 4.3 4.1  ?CL 109 108  ?CO2 26 29  ?GLUCOSE 88 100*  ?BUN 28* 26*  ?CREATININE 1.13 0.99  ?CALCIUM 9.9 9.3  ? ?GFR: ?Estimated Creatinine Clearance: 47.6 mL/min (by C-G formula based on SCr of 0.99 mg/dL). ?Liver Function Tests: ?Recent Labs  ?Lab 06/10/21 ?1925   ?AST 28  ?ALT 17  ?ALKPHOS 54  ?BILITOT 0.8  ?PROT 6.3*  ?ALBUMIN 3.9  ? ?No results for input(s): LIPASE, AMYLASE in the last 168 hours. ?No results for input(s): AMMONIA in the last 168 hours. ?Coagulation Profile: ?No results for input(s): INR, PROTIME in the last 168 hours. ?Cardiac Enzymes: ?No results for input(s): CKTOTAL, CKMB, CKMBINDEX, TROPONINI in the last 168 hours. ?BNP (last 3 results) ?No results for input(s): PROBNP in the last 8760 hours. ?HbA1C: ?No results for input(s): HGBA1C in the last 72 hours. ?CBG: ?No results for input(s): GLUCAP in the last 168 hours. ?Lipid Profile: ?No results for input(s): CHOL, HDL, LDLCALC, TRIG, CHOLHDL, LDLDIRECT in the last 72 hours. ?Thyroid Function Tests: ?No results for input(s): TSH, T4TOTAL, FREET4, T3FREE, THYROIDAB in the last 72 hours. ?Anemia Panel: ?No results for input(s): VITAMINB12, FOLATE, FERRITIN, TIBC, IRON, RETICCTPCT in the last 72 hours. ?Sepsis Labs: ?No results for input(s): PROCALCITON, LATICACIDVEN in the last 168 hours. ? ?No results found for this or any previous visit (from the past 240 hour(s)).  ? ? ? ? ? ?Radiology Studies: ?No results found. ? ? ? ? ? ?Scheduled Meds: ? cholecalciferol  2,000 Units Oral Daily  ? docusate sodium  100 mg Oral BID  ? enoxaparin (LOVENOX) injection  40 mg Subcutaneous Q24H  ? feeding supplement  237 mL Oral BID BM  ? mirtazapine  7.5 mg Oral QHS  ? multivitamin with minerals  1 tablet Oral Daily  ? polyethylene glycol  17 g Oral Daily  ? tamsulosin  0.4 mg Oral Daily  ? cyanocobalamin  1,000 mcg Oral Daily  ? ?Continuous Infusions: ? ? LOS: 0 days  ? ?Time spent= 35 mins ? ? ? ?Raeann Offner Arsenio Loader, MD ?[facility] ? ?If 7PM-7AM, please contact night-coverage ? ?06/13/2021, 8:18 AM  ? ?

## 2021-06-13 NOTE — Progress Notes (Signed)
Patient refused ordered tap water enema.  Says it is his worst nightmare. ?

## 2021-06-13 NOTE — Progress Notes (Signed)
? ?      CROSS COVER NOTE ? ?NAME: David [name] ?MRN: [id number] ?DOB : Feb 20, 1941 ? ? ? ?Date of Service ?  06/13/2021  ?HPI/Events of Note ?  Secure chat received from nurse reporting confusion. On her assessment David [name] was not aware he was in the hospital and called multiple family members asking them where he was. ? ?On arrival to bedside David [name] was sleep and aroused to voice;  oriented x4. Aware that he lost track of a 2 hr period earlier today. No focal neurological deficits on exam. David [name] endorses poor sleep previous night. ? ? ?0613: This AM David [name] is sleeping but arousable to voice and able to answer orientation questions appropriately, make eye contact, and engage in conversation.   ?Interventions ?  Plan: ?Delirium precautions ?Sleep hygiene ?Consider CT head if confusion persists ?   ?  ? ?Neomia Glass MHA, MSN, FNP-BC ?Nurse Practitioner ?[facility] ?[place] ?Pager [phone] ? ?

## 2021-06-14 DIAGNOSIS — E440 Moderate protein-calorie malnutrition: Secondary | ICD-10-CM | POA: Diagnosis not present

## 2021-06-14 DIAGNOSIS — F3170 Bipolar disorder, currently in remission, most recent episode unspecified: Secondary | ICD-10-CM | POA: Diagnosis not present

## 2021-06-14 DIAGNOSIS — K5641 Fecal impaction: Secondary | ICD-10-CM | POA: Diagnosis not present

## 2021-06-14 DIAGNOSIS — K5904 Chronic idiopathic constipation: Secondary | ICD-10-CM | POA: Diagnosis not present

## 2021-06-14 DIAGNOSIS — N400 Enlarged prostate without lower urinary tract symptoms: Secondary | ICD-10-CM | POA: Diagnosis not present

## 2021-06-14 LAB — BASIC METABOLIC PANEL
Anion gap: 5 (ref 5–15)
BUN: 34 mg/dL — ABNORMAL HIGH (ref 8–23)
CO2: 29 mmol/L (ref 22–32)
Calcium: 9.3 mg/dL (ref 8.9–10.3)
Chloride: 105 mmol/L (ref 98–111)
Creatinine, Ser: 1.02 mg/dL (ref 0.61–1.24)
GFR, Estimated: >60 mL/min (ref >60)
Glucose, Bld: 95 mg/dL (ref 70–99)
Potassium: 4.3 mmol/L (ref 3.5–5.1)
Sodium: 139 mmol/L (ref 135–145)

## 2021-06-14 LAB — CBC
HCT: 32.2 % — ABNORMAL LOW (ref 39.0–52.0)
Hemoglobin: 10.4 g/dL — ABNORMAL LOW (ref 13.0–17.0)
MCH: 33 pg (ref 26.0–34.0)
MCHC: 32.3 g/dL (ref 30.0–36.0)
MCV: 102.2 fL — ABNORMAL HIGH (ref 80.0–100.0)
Platelets: 161 10*3/uL (ref 150–400)
RBC: 3.15 MIL/uL — ABNORMAL LOW (ref 4.22–5.81)
RDW: 15.2 % (ref 11.5–15.5)
WBC: 2.9 10*3/uL — ABNORMAL LOW (ref 4.0–10.5)
nRBC: 0 % (ref 0.0–0.2)

## 2021-06-14 LAB — MAGNESIUM: Magnesium: 2.1 mg/dL (ref 1.7–2.4)

## 2021-06-14 LAB — GLUCOSE, CAPILLARY: Glucose-Capillary: 91 mg/dL (ref 70–99)

## 2021-06-14 NOTE — Progress Notes (Signed)
?PROGRESS NOTE ? ? ? ?David [name]  NFA:[id number] DOB: January 19, 1941 DOA: 06/10/2021 ?PCP: Leonel Ramsay, MD  ? ?Brief Narrative:  ?81 year old with history of bipolar disorder, GERD, previous history of ECT therapy, chronic constipation brought to the emergency room with anorexia, diminished appetite and failure to thrive.  Apparently lives at home, does not mobilize and does not eat.  Does not eat with fear of making constipation worse.  In the emergency room, hemodynamically stable.  CT abdomen with significant fecal impaction, rectal wall thickening, nonobstructive renal calculi.  CT head with chronic vascular changes.  No acute changes.  Due to significant symptoms, he was admitted to the hospital.  Patient has been started on aggressive bowel regimen.  PT/OT recommended SNF therefore awaiting placement.  During the hospitalization is also seen by psychiatry who has changed his trazodone to Remeron at bedtime but ptn and daughter refusing for him to be on any. Will stop antidepressants.  ? ? ?Assessment & Plan: ? Principal Problem: ?  Constipation ?Active Problems: ?  Fecal impaction ([place]) ?  BPH (benign prostatic hyperplasia) ?  Bipolar affective disorder in remission [facility]) ?  Malnutrition of moderate degree ?  ?Severe constipation with stercoral colitis ?- Continue aggressive bowel regimen. ? ?Bipolar disorder/depression ?- Seen by psychiatry recommended trazodone switched to Remeron at bedtime but patient and family does not want him to be on any type of antidepressant or other medications.  We will stop this. ? ?Frail, debility and ambulatory dysfunction ?- PT/OT recommending  SNF, TOC team working on placement. ? ? ? ? ?DVT prophylaxis: Lovenox ?Code Status: Full code ?Family Communication: Spoke with patient's daughter over the phone ? ?Medically stable for discharge ? ? ?Nutritional status ? ? ? ?Signs/Symptoms: energy intake < or equal to 75% for > or equal to 1 month, mild fat depletion, moderate  fat depletion, mild muscle depletion, moderate muscle depletion ? ?Interventions: Ensure Enlive (each supplement provides 350kcal and 20 grams of protein), MVI, Liberalize Diet ? ?Body mass index is 19.04 kg/m?. ? ?  ? ? ? ? ? ?Subjective: ?Resting comfortably.  Yesterday had an episode of confusion.  Tells me his abdomen feels much better. ? ?Examination: ? ?Constitutional: Not in acute distress.  Elderly frail ?Respiratory: Clear to auscultation bilaterally ?Cardiovascular: Normal sinus rhythm, no rubs ?Abdomen: Nontender nondistended good bowel sounds ?Musculoskeletal: No edema noted ?Skin: No rashes seen ?Neurologic: CN 2-12 grossly intact.  And nonfocal ?Psychiatric: Normal judgment and insight. Alert and oriented x 3. Normal mood. ? ?Objective: ?Vitals:  ? 06/13/21 1651 06/13/21 2058 06/14/21 0532 06/14/21 0745  ?BP: (!) 141/93 107/65 101/60 96/65  ?Pulse: (!) 102 67 67 68  ?Resp: '19 17 20 16  '$ ?Temp: 98 ?F (36.7 ?C) 99 ?F (37.2 ?C) 98.8 ?F (37.1 ?C) 99.8 ?F (37.7 ?C)  ?TempSrc: Oral Oral    ?SpO2: 93% 99% 95% 95%  ?Weight:      ?Height:      ? ? ?Intake/Output Summary (Last 24 hours) at 06/14/2021 1131 ?Last data filed at 06/14/2021 1022 ?Gross per 24 hour  ?Intake 360 ml  ?Output --  ?Net 360 ml  ? ?Filed Weights  ? 06/10/21 1920  ?Weight: 58.5 kg  ? ? ? ?Data Reviewed:  ? ?CBC: ?Recent Labs  ?Lab 06/10/21 ?1925 06/11/21 ?0500 06/14/21 ?0402  ?WBC 2.9* 3.2* 2.9*  ?NEUTROABS 1.0*  --   --   ?HGB 12.2* 10.9* 10.4*  ?HCT 38.4* 33.7* 32.2*  ?MCV 105.8* 104.7* 102.2*  ?  PLT 154 154 161  ? ?Basic Metabolic Panel: ?Recent Labs  ?Lab 06/10/21 ?1925 06/11/21 ?0500 06/14/21 ?0402  ?NA 143 143 139  ?K 4.3 4.1 4.3  ?CL 109 108 105  ?CO2 '26 29 29  '$ ?GLUCOSE 88 100* 95  ?BUN 28* 26* 34*  ?CREATININE 1.13 0.99 1.02  ?CALCIUM 9.9 9.3 9.3  ?MG  --   --  2.1  ? ?GFR: ?Estimated Creatinine Clearance: 46.2 mL/min (by C-G formula based on SCr of 1.02 mg/dL). ?Liver Function Tests: ?Recent Labs  ?Lab 06/10/21 ?1925  ?AST 28  ?ALT 17   ?ALKPHOS 54  ?BILITOT 0.8  ?PROT 6.3*  ?ALBUMIN 3.9  ? ?No results for input(s): LIPASE, AMYLASE in the last 168 hours. ?No results for input(s): AMMONIA in the last 168 hours. ?Coagulation Profile: ?No results for input(s): INR, PROTIME in the last 168 hours. ?Cardiac Enzymes: ?No results for input(s): CKTOTAL, CKMB, CKMBINDEX, TROPONINI in the last 168 hours. ?BNP (last 3 results) ?No results for input(s): PROBNP in the last 8760 hours. ?HbA1C: ?No results for input(s): HGBA1C in the last 72 hours. ?CBG: ?Recent Labs  ?Lab 06/14/21 ?4270  ?GLUCAP 91  ? ?Lipid Profile: ?No results for input(s): CHOL, HDL, LDLCALC, TRIG, CHOLHDL, LDLDIRECT in the last 72 hours. ?Thyroid Function Tests: ?No results for input(s): TSH, T4TOTAL, FREET4, T3FREE, THYROIDAB in the last 72 hours. ?Anemia Panel: ?No results for input(s): VITAMINB12, FOLATE, FERRITIN, TIBC, IRON, RETICCTPCT in the last 72 hours. ?Sepsis Labs: ?No results for input(s): PROCALCITON, LATICACIDVEN in the last 168 hours. ? ?No results found for this or any previous visit (from the past 240 hour(s)).  ? ? ? ? ? ?Radiology Studies: ?No results found. ? ? ? ? ? ?Scheduled Meds: ? cholecalciferol  2,000 Units Oral Daily  ? docusate sodium  200 mg Oral BID  ? enoxaparin (LOVENOX) injection  40 mg Subcutaneous Q24H  ? feeding supplement  237 mL Oral BID BM  ? multivitamin with minerals  1 tablet Oral Daily  ? polyethylene glycol  17 g Oral BID  ? tamsulosin  0.4 mg Oral Daily  ? cyanocobalamin  1,000 mcg Oral Daily  ? ?Continuous Infusions: ? ? LOS: 0 days  ? ?Time spent= 35 mins ? ? ? ?Jalecia Leon Arsenio Loader, MD ?[facility] ? ?If 7PM-7AM, please contact night-coverage ? ?06/14/2021, 11:31 AM  ? ?

## 2021-06-14 NOTE — TOC Progression Note (Signed)
Transition of Care (TOC) - Progression Note  ? ? ?Patient Details  ?Name: David [name] ?MRN: [id number] ?Date of Birth: 31-Jul-1940 ? ?Transition of Care (TOC) CM/SW Contact  ?[name], RN ?Phone Number: ?06/14/2021, 4:48 PM ? ?Clinical Narrative:    ?Family requested [facility]. Spoke with Otila Kluver in admissions. Clinical faxed to [phone] ? ? ?Expected Discharge Plan: [place] ?Barriers to Discharge: Continued Medical Work up ? ?Expected Discharge Plan and Services ?Expected Discharge Plan: [place] ?  ?Discharge Planning Services: CM Consult ?Post Acute Care Choice: [place] ?Living arrangements for the past 2 months: [place] ?                ?DME Arranged: N/A ?DME Agency: NA ?  ?  ?  ?HH Arranged: NA ?[place] Agency: NA ?  ?  ?  ? ? ?Social Determinants of Health (SDOH) Interventions ?  ? ?Readmission Risk Interventions ?   ? View : No data to display.  ?  ?  ?  ? ? ?

## 2021-06-14 NOTE — TOC Progression Note (Signed)
Transition of Care (TOC) - Progression Note  ? ? ?Patient Details  ?Name: David [name] ?MRN: [id number] ?Date of Birth: 1940/05/25 ? ?Transition of Care (TOC) CM/SW Contact  ?[name], RN ?Phone Number: ?06/14/2021, 3:24 PM ? ?Clinical Narrative:    ?Spoke with [name], Patient's daughter. [name] requesting IP rehab at  [facility]. Advised patient would have to be able to tolerate 3 hours of therapy to qualify for that level of care. PT/OT recommends SNF after working with patient this afternoon. Attempted to contact wife, no answer. Contacted patient's Daughter, [name]. Family requested [facility]. [name] contacted. Spoke with [name] initially offered bed but unable to accept patient due to COVID-19 break out. [name] requested a facility in [place]. Advised she would have to choose from remaining facilities that have offered a bed due to patient being medically stable for discharge. [name] will call back with a decision for [facility] or [facility].  ? ? ?Expected Discharge Plan: [place] ?Barriers to Discharge: Continued Medical Work up ? ?Expected Discharge Plan and Services ?Expected Discharge Plan: [place] ?  ?Discharge Planning Services: CM Consult ?Post Acute Care Choice: [place] ?Living arrangements for the past 2 months: [place] ?                ?DME Arranged: N/A ?DME Agency: NA ?  ?  ?  ?HH Arranged: NA ?[place] Agency: NA ?  ?  ?  ? ? ?Social Determinants of Health (SDOH) Interventions ?  ? ?Readmission Risk Interventions ?   ? View : No data to display.  ?  ?  ?  ? ? ?

## 2021-06-14 NOTE — Progress Notes (Signed)
Physical Therapy Treatment ?Patient Details ?Name: David [name] ?MRN: [id number] ?DOB: Apr 12, 1940 ?Today's Date: 06/14/2021 ? ? ?History of Present Illness Pt is an 81 y/o M admitted on 06/10/21 after presenting to the ED with c/o acute onset constipation with associated anorexia & diminished appetitie with subsequent 40 lbs weight loss & failure to thrive. Abdominal and pelvic CT scan revealed fecal impaction with rectal wall thickening compatible with stercoral colitis. PMH: bipolar disorder, GERD, OA, allergic rhinitis ? ?  ?PT Comments  ? ? Pt's wife declined PT session earlier today d/t pt lethargic (anticipate d/t medication related).  Pt resting in recliner upon PT arrival 2nd attempt (recently finished working with OT).  During session pt SBA with transfers and CGA ambulating 200 feet with RW use.  Pt appears to have done better during PT session than recent OT session (pt requiring increased assist for mobility and noted with a couple loss of balance during OT session).  Pt appears to fluctuate with assist levels--d/t this pt would still benefit from SNF to improve balance and safety with functional mobility. ?  ?Recommendations for follow up therapy are one component of a multi-disciplinary discharge planning process, led by the attending physician.  Recommendations may be updated based on patient status, additional functional criteria and insurance authorization. ? ?Follow Up Recommendations ? Skilled nursing-short term rehab (<3 hours/day) ?  ?  ?Assistance Recommended at Discharge Intermittent Supervision/Assistance  ?Patient can return home with the following A little help with walking and/or transfers;A little help with bathing/dressing/bathroom;Assistance with cooking/housework;Direct supervision/assist for financial management;Direct supervision/assist for medications management;Help with stairs or ramp for entrance;Assist for transportation ?  ?Equipment Recommendations ? Rolling walker (2 wheels)  ?   ?Recommendations for Other Services   ? ? ?  ?Precautions / Restrictions Precautions ?Precautions: Fall ?Restrictions ?Weight Bearing Restrictions: No  ?  ? ?Mobility ? Bed Mobility ?  ?  ?  ?  ?  ?  ?  ?General bed mobility comments: Deferred (pt in recliner beginning/end of session) ?  ? ?Transfers ?Overall transfer level: Needs assistance ?Equipment used: Rolling walker (2 wheels) ?Transfers: Sit to/from Stand ?Sit to Stand: Supervision ?  ?  ?  ?  ?  ?General transfer comment: SBA for transfers from recliner x2 trials (with RW use) ?  ? ?Ambulation/Gait ?Ambulation/Gait assistance: Min guard ?Gait Distance (Feet): 200 Feet ?Assistive device: Rolling walker (2 wheels) ?  ?Gait velocity: decreased ?  ?  ?General Gait Details: vc's to stay closer to RW ? ? ?Stairs ?  ?  ?  ?  ?  ? ? ?Wheelchair Mobility ?  ? ?Modified Rankin (Stroke Patients Only) ?  ? ? ?  ?Balance Overall balance assessment: Needs assistance, History of Falls ?Sitting-balance support: Single extremity supported ?Sitting balance-Leahy Scale: Fair ?Sitting balance - Comments: steady static standing with at least single UE support ?  ?Standing balance support: Bilateral upper extremity supported, During functional activity ?Standing balance-Leahy Scale: Good ?Standing balance comment: ambulating with RW ?  ?  ?  ?  ?  ?  ?  ?  ?  ?  ?  ?  ? ?  ?Cognition Arousal/Alertness: Awake/alert ?Behavior During Therapy: [facility] for tasks assessed/performed ?Overall Cognitive Status: Within Functional Limits for tasks assessed ?  ?  ?  ?  ?  ?  ?  ?  ?  ?  ?  ?  ?  ?  ?  ?  ?General Comments: A&O x4; good safety awareness; some  increased time for processing for multi-step cues ?  ?  ? ?  ?Exercises Total Joint Exercises ?Knee Flexion: AROM, Strengthening, Both, 10 reps, Standing (hamstring curls in standing; with B UE support) ?General Exercises - Lower Extremity ?Hip ABduction/ADduction: AROM, Strengthening, Both, 10 reps, Standing (with B UE support) ?Hip  Flexion/Marching: AROM, Strengthening, Both, 10 reps, Standing (with B UE support) ?Heel Raises: AROM, Strengthening, Both, 10 reps, Standing (with B UE support) ? ?  ?General Comments  Nursing cleared pt for participation in physical therapy.  Pt agreeable to PT session. ?  ?  ? ?Pertinent Vitals/Pain Pain Assessment ?Pain Assessment: No/denies pain ?Vitals (HR and O2 on room air) stable and WFL throughout treatment session.  ? ? ?Home Living   ?  ?  ?  ?  ?  ?  ?  ?  ?  ?   ?  ?Prior Function    ?  ?  ?   ? ?PT Goals (current goals can now be found in the care plan section) Acute Rehab PT Goals ?Patient Stated Goal: go to rehab ?PT Goal Formulation: With family ?Time For Goal Achievement: 06/25/21 ?Potential to Achieve Goals: Fair ?Progress towards PT goals: Progressing toward goals ? ?  ?Frequency ? ? ? Min 2X/week ? ? ? ?  ?PT Plan Current plan remains appropriate  ? ? ?Co-evaluation   ?  ?  ?  ?  ? ?  ?AM-PAC PT "6 Clicks" Mobility   ?Outcome Measure ? Help needed turning from your back to your side while in a flat bed without using bedrails?: None ?Help needed moving from lying on your back to sitting on the side of a flat bed without using bedrails?: None ?Help needed moving to and from a bed to a chair (including a wheelchair)?: A Little ?Help needed standing up from a chair using your arms (e.g., wheelchair or bedside chair)?: A Little ?Help needed to walk in hospital room?: A Little ?Help needed climbing 3-5 steps with a railing? : A Little ?6 Click Score: 20 ? ?  ?End of Session Equipment Utilized During Treatment: Gait belt ?Activity Tolerance: Patient tolerated treatment well ?Patient left: in chair;with call bell/phone within reach;with chair alarm set ?Nurse Communication: Mobility status;Precautions ?PT Visit Diagnosis: Unsteadiness on feet (R26.81);Muscle weakness (generalized) (M62.81) ?  ? ? ?Time: 1791-5056 ?PT Time Calculation (min) (ACUTE ONLY): 20 min ? ?Charges:  $Therapeutic Activity: 8-22  mins          ?          ?Leitha Bleak, PT ?06/14/21, 4:29 PM ? ? ?

## 2021-06-14 NOTE — Progress Notes (Signed)
Occupational Therapy Treatment ?Patient Details ?Name: David [name] ?MRN: [id number] ?DOB: 08-28-1940 ?Today's Date: 06/14/2021 ? ? ?History of present illness Pt is an 81 y/o M admitted on 06/10/21 after presenting to the ED with c/o acute onset constipation with associated anorexia & diminished appetitie with subsequent 40 lbs weight loss & failure to thrive. Abdominal and pelvic CT scan revealed fecal impaction with rectal wall thickening compatible with stercoral colitis. PMH: bipolar disorder, GERD, OA, allergic rhinitis ?  ?OT comments ? Chart reviewed, pt greeted in bed, wife present at start of evaluation. Pt wife had declined tx session earlier as pt with ?lethargy. Improved on second attempt this afternoon. Pt is alert and oriented x4, fair-good safety awareness. Tx session targeted progressing strength and endurance to facilitate safe completion of ADL/functional mobility. Progress noted as compared to evaluation, however pt requires CGA-MIN A for LB dressing, CGA for grooming tasks at sink level. Pt transfers to toilet with CGA to sit, MIN A for stand from toilet. Pt does require CGA-MIN A with two slight posterior LOB with RW during ADL amb to/from bathroom. Frequent vcs are required for appropriate RW use. Pt is left in bedside chair in care of PT, NAD, all needs met. Discharge recommendation remains appropriate.   ? ?Recommendations for follow up therapy are one component of a multi-disciplinary discharge planning process, led by the attending physician.  Recommendations may be updated based on patient status, additional functional criteria and insurance authorization. ?   ?Follow Up Recommendations ? Skilled nursing-short term rehab (<3 hours/day)  ?  ?Assistance Recommended at Discharge Intermittent Supervision/Assistance  ?Patient can return home with the following ? A little help with walking and/or transfers;A little help with bathing/dressing/bathroom;Assistance with cooking/housework;Assist for  transportation ?  ?Equipment Recommendations ?    ?  ?Recommendations for Other Services   ? ?  ?Precautions / Restrictions Precautions ?Precautions: Fall ?Restrictions ?Weight Bearing Restrictions: No  ? ? ?  ? ?Mobility Bed Mobility ?Overal bed mobility: Modified Independent ?  ?  ?  ?  ?  ?  ?  ?  ? ?Transfers ?Overall transfer level: Needs assistance ?Equipment used: Rolling walker (2 wheels) ?Transfers: Sit to/from Stand ?Sit to Stand: Supervision, Min assist ?  ?  ?  ?  ?  ?General transfer comment: supervision STS from bed, MIN A from toilet ?  ?  ?Balance Overall balance assessment: Needs assistance, History of Falls ?Sitting-balance support: Feet supported, Bilateral upper extremity supported ?Sitting balance-Leahy Scale: Good ?  ?  ?Standing balance support: Bilateral upper extremity supported, During functional activity ?Standing balance-Leahy Scale: Good ?  ?  ?  ?  ?  ?  ?  ?  ?  ?  ?  ?  ?   ? ?ADL either performed or assessed with clinical judgement  ? ?ADL Overall ADL's : Needs assistance/impaired ?  ?  ?Grooming: Wash/dry hands;Oral care;Wash/dry face;Standing;Min guard ?Grooming Details (indicate cue type and reason): sink level with vcs for RW use ?  ?  ?  ?  ?Upper Body Dressing : Set up;Sitting ?Upper Body Dressing Details (indicate cue type and reason): shirt ?Lower Body Dressing: Min guard;Sit to/from stand;Minimal assistance ?Lower Body Dressing Details (indicate cue type and reason): CGA for socks, shoes; MIN A for pants ?Toilet Transfer: Min guard- MIN A;Ambulation;Rolling walker (2 wheels) ?  ?Toileting- Clothing Manipulation and Hygiene: Supervision/safety;Sit to/from stand ?Toileting - Clothing Manipulation Details (indicate cue type and reason): with RW ?  ?  ?Functional mobility  during ADLs: Min guard;Cueing for safety;Minimal assistance (vcs for appropriate RW use, two posterior LOB requring correction with MIN A) ?  ?  ? ?Extremity/Trunk Assessment   ?  ?  ?  ?  ?  ? ?Vision   ?  ?   ?Perception   ?  ?Praxis   ?  ? ?Cognition Arousal/Alertness: Awake/alert ?Behavior During Therapy: [facility] for tasks assessed/performed ?Overall Cognitive Status: Within Functional Limits for tasks assessed ?  ?  ?  ?  ?  ?  ?  ?  ?  ?  ?  ?  ?  ?  ?  ?  ?General Comments: pt alert and oriented x4, fair-good safety awareness; increased processing time for multi step directives required. ?  ?  ?   ?Exercises Other Exercises ?Other Exercises: edu pt and wife re: role of rehab, discharge recommendations, type of rehab facilities ? ?  ?Shoulder Instructions   ? ? ?  ?General Comments    ? ? ?Pertinent Vitals/ Pain       Pain Assessment ?Pain Assessment: No/denies pain ? ?Home Living   ?  ?  ?  ?  ?  ?  ?  ?  ?  ?  ?  ?  ?  ?  ?  ?  ?  ?  ? ?  ?Prior Functioning/Environment    ?  ?  ?  ?   ? ?Frequency ? Min 2X/week  ? ? ? ? ?  ?Progress Toward Goals ? ?OT Goals(current goals can now be found in the care plan section) ? Progress towards OT goals: Progressing toward goals ? ?Acute Rehab OT Goals ?Patient Stated Goal: get stronger ?OT Goal Formulation: With patient/family ?Time For Goal Achievement: 06/28/21 ?Potential to Achieve Goals: Good  ?Plan Discharge plan remains appropriate   ? ?Co-evaluation ? ? ?   ?  ?  ?  ?  ? ?  ?AM-PAC OT "6 Clicks" Daily Activity     ?Outcome Measure ? ? Help from another person eating meals?: None ?Help from another person taking care of personal grooming?: None ?Help from another person toileting, which includes using toliet, bedpan, or urinal?: None ?Help from another person bathing (including washing, rinsing, drying)?: A Little ?Help from another person to put on and taking off regular upper body clothing?: None ?Help from another person to put on and taking off regular lower body clothing?: None ?6 Click Score: 23 ? ?  ?End of Session Equipment Utilized During Treatment: Gait belt;Rolling walker (2 wheels) ? ?OT Visit Diagnosis: History of falling (Z91.81);Unsteadiness on feet  (R26.81) ?  ?Activity Tolerance Patient tolerated treatment well ?  ?Patient Left with call bell/phone within reach;in chair;with chair alarm set ?  ?Nurse Communication Mobility status ?  ? ?   ? ?Time: 4401-0272 ?OT Time Calculation (min): 26 min ? ?Charges: OT General Charges ?$OT Visit: 1 Visit ?OT Treatments ?$Self Care/Home Management : 23-37 mins ? ?Shanon Payor, OTD OTR/L  ?06/14/21, 4:03 PM  ?

## 2021-06-14 NOTE — Progress Notes (Signed)
Patient with new onset confusion at night. Daughter contacted me to discuss this. I notified Neomia Glass NP that patient was having new onset of confusion tonight. ? ?This morning daughter is visiting and states that patient has all the side effects from remeron like hallucinations. She says lots of medications will have a bad effect on him that are similar to remeron. ?

## 2021-06-15 DIAGNOSIS — K5641 Fecal impaction: Secondary | ICD-10-CM | POA: Diagnosis not present

## 2021-06-15 DIAGNOSIS — N400 Enlarged prostate without lower urinary tract symptoms: Secondary | ICD-10-CM | POA: Diagnosis not present

## 2021-06-15 DIAGNOSIS — K5900 Constipation, unspecified: Secondary | ICD-10-CM | POA: Diagnosis not present

## 2021-06-15 DIAGNOSIS — F319 Bipolar disorder, unspecified: Secondary | ICD-10-CM

## 2021-06-15 DIAGNOSIS — F3170 Bipolar disorder, currently in remission, most recent episode unspecified: Secondary | ICD-10-CM | POA: Diagnosis not present

## 2021-06-15 DIAGNOSIS — K626 Ulcer of anus and rectum: Secondary | ICD-10-CM | POA: Diagnosis not present

## 2021-06-15 DIAGNOSIS — E440 Moderate protein-calorie malnutrition: Secondary | ICD-10-CM | POA: Diagnosis not present

## 2021-06-15 DIAGNOSIS — R54 Age-related physical debility: Secondary | ICD-10-CM | POA: Diagnosis not present

## 2021-06-15 DIAGNOSIS — K5904 Chronic idiopathic constipation: Secondary | ICD-10-CM | POA: Diagnosis not present

## 2021-06-15 LAB — CBC
HCT: 32.4 % — ABNORMAL LOW (ref 39.0–52.0)
Hemoglobin: 10.5 g/dL — ABNORMAL LOW (ref 13.0–17.0)
MCH: 33.3 pg (ref 26.0–34.0)
MCHC: 32.4 g/dL (ref 30.0–36.0)
MCV: 102.9 fL — ABNORMAL HIGH (ref 80.0–100.0)
Platelets: 155 10*3/uL (ref 150–400)
RBC: 3.15 MIL/uL — ABNORMAL LOW (ref 4.22–5.81)
RDW: 15.4 % (ref 11.5–15.5)
WBC: 2.7 10*3/uL — ABNORMAL LOW (ref 4.0–10.5)
nRBC: 0 % (ref 0.0–0.2)

## 2021-06-15 LAB — BASIC METABOLIC PANEL
Anion gap: 3 — ABNORMAL LOW (ref 5–15)
BUN: 30 mg/dL — ABNORMAL HIGH (ref 8–23)
CO2: 27 mmol/L (ref 22–32)
Calcium: 9.3 mg/dL (ref 8.9–10.3)
Chloride: 109 mmol/L (ref 98–111)
Creatinine, Ser: 1.04 mg/dL (ref 0.61–1.24)
GFR, Estimated: >60 mL/min (ref >60)
Glucose, Bld: 82 mg/dL (ref 70–99)
Potassium: 4.1 mmol/L (ref 3.5–5.1)
Sodium: 139 mmol/L (ref 135–145)

## 2021-06-15 LAB — MAGNESIUM: Magnesium: 2.2 mg/dL (ref 1.7–2.4)

## 2021-06-15 MED ORDER — POLYETHYLENE GLYCOL 3350 17 G PO PACK: 17.0000 g | PACK | Freq: Every day | ORAL | 0 refills | Status: AC | PRN

## 2021-06-15 MED ORDER — DOCUSATE SODIUM 100 MG PO CAPS
200.0000 mg | ORAL_CAPSULE | Freq: Two times a day (BID) | ORAL | 0 refills | Status: AC
Start: 2021-06-15 — End: ?

## 2021-06-15 MED ORDER — SENNOSIDES-DOCUSATE SODIUM 8.6-50 MG PO TABS: 2.0000 | ORAL_TABLET | Freq: Every evening | ORAL | Status: DC | PRN

## 2021-06-15 NOTE — Plan of Care (Signed)
Pt AAOx4, no pain. VS are stable. Plan to continue bowel regime. Bed is in lowest position, call light within reach. Will continue to monitor. ?

## 2021-06-15 NOTE — Discharge Summary (Signed)
Physician Discharge Summary  ?David [name] MCN:[id number] DOB: 04-May-1940 DOA: 06/10/2021 ? ?PCP: Leonel Ramsay, MD ? ?Admit date: 06/10/2021 ?Discharge date: 06/15/2021 ? ?Admitted From: Home ?Disposition:  SNF ? ?Recommendations for Outpatient Follow-up:  ?Follow up with PCP in 1-2 weeks ?Please obtain BMP/CBC in one week your next doctors visit.  ?Bowel regimen has been prescribed ?Patient and family states any type of antidepressant/antipsychotic medication makes him very confused and hallucinate. ? ? ?Discharge Condition: Stable ?CODE STATUS: Full code ?Diet recommendation: Regular ? ?Brief/Interim Summary: ?81 year old with history of bipolar disorder, GERD, previous history of ECT therapy, chronic constipation brought to the emergency room with anorexia, diminished appetite and failure to thrive.  Apparently lives at home, does not mobilize and does not eat.  Does not eat with fear of making constipation worse.  In the emergency room, hemodynamically stable.  CT abdomen with significant fecal impaction, rectal wall thickening, nonobstructive renal calculi.  CT head with chronic vascular changes.  No acute changes.  Due to significant symptoms, he was admitted to the hospital.  Patient has been started on aggressive bowel regimen.  PT/OT recommended SNF therefore awaiting placement.  During the hospitalization is also seen by psychiatry who has changed his trazodone to Remeron at bedtime but ptn and daughter refusing for him to be on any. Will stop antidepressants.  ? ?Patient remains medically stable for discharge. ? ?PT/OT evaluation performed.  SNF recommended.  SNF appropriate as the patient has received 3 days of hospital care (or the 3 days stay Has been made by the patient's insurance company) and is felt to need rehab services to restore this patient to their prior level of function to achieve safe transition back to home care.  This patient needs rehab services for at least 5 days/week and skilled  nursing services daily to facilitate this transition.  Rehab is been requested as the most appropriate discharge option for this patient and is not felt to be custodial care as evidenced by prior to hospitalization he was very ambulatory currently requiring assistance with therapy.  He would really benefit from therapy ?  ?  ?Assessment & Plan: ? Principal Problem: ?  Constipation ?Active Problems: ?  Fecal impaction ([place]) ?  BPH (benign prostatic hyperplasia) ?  Bipolar affective disorder in remission [facility]) ?  Malnutrition of moderate degree ?  ?Severe constipation with stercoral colitis ?- Continue aggressive bowel regimen.  Scheduled Empirin regimen has been ordered. ?  ?Bipolar disorder/depression ?- Seen by psychiatry recommended trazodone switched to Remeron at bedtime but patient and family does not want him to be on any type of antidepressant or other medications-causes hallucinations.  We will stop this. ?  ?Frail, debility and ambulatory dysfunction ?- PT/OT recommending  SNF, TOC team working on placement. ?  ?  ?  ? ? ? ?Consultations: ?Psychiatry ? ?Subjective: ?No new complaints.  Wife is present at bedside. ? ?Discharge Exam: ?Vitals:  ? 06/15/21 0428 06/15/21 0748  ?BP: (!) 98/56 120/63  ?Pulse: (!) 58 (!) 58  ?Resp: 18 16  ?Temp: 98.2 ?F (36.8 ?C) 98 ?F (36.7 ?C)  ?SpO2: 96% 100%  ? ?Vitals:  ? 06/14/21 1515 06/14/21 1955 06/15/21 0428 06/15/21 0748  ?BP: 93/65 (!) 100/52 (!) 98/56 120/63  ?Pulse: 68 67 (!) 58 (!) 58  ?Resp: '16 18 18 16  '$ ?Temp: 98.7 ?F (37.1 ?C) 98.5 ?F (36.9 ?C) 98.2 ?F (36.8 ?C) 98 ?F (36.7 ?C)  ?TempSrc:  Oral  Oral  ?SpO2: 100% 98%  96% 100%  ?Weight:      ?Height:      ? ? ?General: Pt is alert, awake, not in acute distress ?Cardiovascular: RRR, S1/S2 +, no rubs, no gallops ?Respiratory: CTA bilaterally, no wheezing, no rhonchi ?Abdominal: Soft, NT, ND, bowel sounds + ?Extremities: no edema, no cyanosis ? ?Discharge Instructions ? ? ?Allergies as of 06/15/2021   ? ?   Reactions   ? Remeron [mirtazapine]   ? Hallucination very sleepy, dizzy per daughter  ? Quetiapine Anxiety  ? Restlessness, worsening anxiety  ?Restlessness, worsening anxiety   ? ?  ? ?  ?Medication List  ?  ? ?TAKE these medications   ? ?acetaminophen 500 MG tablet ?Commonly known as: TYLENOL ?Take 500 mg by mouth every 8 (eight) hours as needed for moderate pain. ?  ?Cholecalciferol 25 MCG (1000 UT) capsule ?Take 2,000 Units by mouth daily. ?  ?cyanocobalamin 1000 MCG tablet ?Take 1,000 mcg by mouth daily. ?  ?docusate sodium 100 MG capsule ?Commonly known as: COLACE ?Take 2 capsules (200 mg total) by mouth 2 (two) times daily. ?  ?Lutein 6 MG Caps ?Take 1 capsule by mouth daily. ?  ?multivitamin tablet ?Take 1 tablet by mouth daily. ?  ?multivitamin-lutein Caps capsule ?Take 1 capsule by mouth daily. ?  ?polyethylene glycol 17 g packet ?Commonly known as: MIRALAX / GLYCOLAX ?Take 17 g by mouth daily as needed for moderate constipation or severe constipation. ?  ?senna-docusate 8.6-50 MG tablet ?Commonly known as: Senokot-S ?Take 2 tablets by mouth at bedtime as needed for moderate constipation or mild constipation. ?  ?tamsulosin 0.4 MG Caps capsule ?Commonly known as: FLOMAX ?Take 0.4 mg by mouth daily. ?  ? ?  ? ? Follow-up Information   ? ? Leonel Ramsay, MD Follow up in 1 week(s).   ?Specialty: Infectious Diseases ?Contact information: ?[address] ?[place] [address] ?[phone] ? ? ?  ?  ? ?  ?  ? ?  ? ?Allergies  ?Allergen Reactions  ? Remeron [Mirtazapine]   ?  Hallucination very sleepy, dizzy per daughter  ? Quetiapine Anxiety  ?  Restlessness, worsening anxiety  ?Restlessness, worsening anxiety   ? ? ?You were cared for by a hospitalist during your hospital stay. If you have any questions about your discharge medications or the care you received while you were in the hospital after you are discharged, you can call the unit and asked to speak with the hospitalist on call if the hospitalist  that took care of you is not available. Once you are discharged, your primary care physician will handle any further medical issues. Please note that no refills for any discharge medications will be authorized once you are discharged, as it is imperative that you return to your primary care physician (or establish a relationship with a primary care physician if you do not have one) for your aftercare needs so that they can reassess your need for medications and monitor your lab values. ? ? ?Procedures/Studies: ?CT Head Wo Contrast ? ?Result Date: 06/10/2021 ?CLINICAL DATA:  Head trauma. EXAM: CT HEAD WITHOUT CONTRAST TECHNIQUE: Contiguous axial images were obtained from the base of the skull through the vertex without intravenous contrast. RADIATION DOSE REDUCTION: This exam was performed according to the departmental dose-optimization program which includes automated exposure control, adjustment of the mA and/or kV according to patient size and/or use of iterative reconstruction technique. COMPARISON:  Head CT dated 01/31/2019. FINDINGS: Brain: Mild age-related atrophy and chronic microvascular  ischemic changes. There is no acute intracranial hemorrhage. No mass effect or midline shift. No extra-axial fluid collection. Vascular: No hyperdense vessel or unexpected calcification. Skull: Normal. Negative for fracture or focal lesion. Sinuses/Orbits: No acute finding. Other: None IMPRESSION: 1. No acute intracranial pathology. 2. Mild age-related atrophy and chronic microvascular ischemic changes. Electronically Signed   By: Anner Crete M.D.   On: 06/10/2021 21:18  ? ?CT ABDOMEN PELVIS W CONTRAST ? ?Result Date: 06/10/2021 ?CLINICAL DATA:  Weakness, decreased appetite, last bowel movement 2 days ago EXAM: CT ABDOMEN AND PELVIS WITH CONTRAST TECHNIQUE: Multidetector CT imaging of the abdomen and pelvis was performed using the standard protocol following bolus administration of intravenous contrast. RADIATION DOSE  REDUCTION: This exam was performed according to the departmental dose-optimization program which includes automated exposure control, adjustment of the mA and/or kV according to patient size and/or use of ite

## 2021-06-15 NOTE — TOC Progression Note (Signed)
Transition of Care (TOC) - Progression Note  ? ? ?Patient Details  ?Name: David [name] ?MRN: [id number] ?Date of Birth: 12/17/1940 ? ?Transition of Care (TOC) CM/SW Contact  ?[name], RN ?Phone Number: ?06/15/2021, 8:55 AM ? ?Clinical Narrative:    ?Spoke with Otila Kluver in admission. Bed offer  given. Otila Kluver will begin the [facility]. Call placed to [facility], Patient's daughter to confirm bed acceptance. Advised Su Ley would still be needed from insurance.  ? ? ?Expected Discharge Plan: [place] ?Barriers to Discharge: Continued Medical Work up ? ?Expected Discharge Plan and Services ?Expected Discharge Plan: [place] ?  ?Discharge Planning Services: CM Consult ?Post Acute Care Choice: [place] ?Living arrangements for the past 2 months: [place] ?                ?DME Arranged: N/A ?DME Agency: NA ?  ?  ?  ?HH Arranged: NA ?[place] Agency: NA ?  ?  ?  ? ? ?Social Determinants of Health (SDOH) Interventions ?  ? ?Readmission Risk Interventions ?   ? View : No data to display.  ?  ?  ?  ? ? ?

## 2021-06-16 DIAGNOSIS — R54 Age-related physical debility: Secondary | ICD-10-CM

## 2021-06-16 DIAGNOSIS — E440 Moderate protein-calorie malnutrition: Secondary | ICD-10-CM

## 2021-06-16 DIAGNOSIS — N400 Enlarged prostate without lower urinary tract symptoms: Secondary | ICD-10-CM | POA: Diagnosis not present

## 2021-06-16 DIAGNOSIS — K5904 Chronic idiopathic constipation: Secondary | ICD-10-CM

## 2021-06-16 DIAGNOSIS — F3170 Bipolar disorder, currently in remission, most recent episode unspecified: Secondary | ICD-10-CM | POA: Diagnosis not present

## 2021-06-16 LAB — CBC
HCT: 32.4 % — ABNORMAL LOW (ref 39.0–52.0)
Hemoglobin: 10.6 g/dL — ABNORMAL LOW (ref 13.0–17.0)
MCH: 34.1 pg — ABNORMAL HIGH (ref 26.0–34.0)
MCHC: 32.7 g/dL (ref 30.0–36.0)
MCV: 104.2 fL — ABNORMAL HIGH (ref 80.0–100.0)
Platelets: 166 10*3/uL (ref 150–400)
RBC: 3.11 MIL/uL — ABNORMAL LOW (ref 4.22–5.81)
RDW: 15.4 % (ref 11.5–15.5)
WBC: 3.2 10*3/uL — ABNORMAL LOW (ref 4.0–10.5)
nRBC: 0 % (ref 0.0–0.2)

## 2021-06-16 LAB — BASIC METABOLIC PANEL
Anion gap: 7 (ref 5–15)
BUN: 33 mg/dL — ABNORMAL HIGH (ref 8–23)
CO2: 29 mmol/L (ref 22–32)
Calcium: 9.6 mg/dL (ref 8.9–10.3)
Chloride: 105 mmol/L (ref 98–111)
Creatinine, Ser: 1.03 mg/dL (ref 0.61–1.24)
GFR, Estimated: >60 mL/min (ref >60)
Glucose, Bld: 81 mg/dL (ref 70–99)
Potassium: 4 mmol/L (ref 3.5–5.1)
Sodium: 141 mmol/L (ref 135–145)

## 2021-06-16 LAB — VITAMIN B12: Vitamin B-12: 589 pg/mL (ref 180–914)

## 2021-06-16 LAB — MAGNESIUM: Magnesium: 2.2 mg/dL (ref 1.7–2.4)

## 2021-06-16 MED ORDER — FLUTICASONE PROPIONATE 50 MCG/ACT NA SUSP
2.0000 | Freq: Every day | NASAL | Status: DC
  Administered 2021-06-16 – 2021-06-17 (×2): 2 via NASAL
  Filled 2021-06-16: qty 16

## 2021-06-16 MED ORDER — FLUTICASONE PROPIONATE 50 MCG/ACT NA SUSP
2.0000 | Freq: Every day | NASAL | 2 refills | Status: AC
Start: 2021-06-16 — End: ?

## 2021-06-16 NOTE — Progress Notes (Signed)
PT Cancellation Note ? ?Patient Details ?Name: David [name] ?MRN: [id number] ?DOB: 18-Sep-1940 ? ? ?Cancelled Treatment:     PT attempt. Pt was sitting EOB with supportive spouse at bedside. Pt wanting to get "washed up" (take a bath) prior to working with Chief Strategy Officer. Will return shortly and continue to follow per current POC.  ? ? ?Willette Pa ?06/16/2021, 2:16 PM ?

## 2021-06-16 NOTE — Assessment & Plan Note (Signed)
Currently awaiting insurance authorization for rehab.  Did better today with physical therapy. ?

## 2021-06-16 NOTE — Assessment & Plan Note (Signed)
Moderate malnutrition related to chronic illness ?

## 2021-06-16 NOTE — TOC Progression Note (Addendum)
Transition of Care (TOC) - Progression Note  ? ? ?Patient Details  ?Name: SEBASTHIAN STAILEY ?MRN: [id number] ?Date of Birth: 10/21/1940 ? ?Transition of Care (TOC) CM/SW Contact  ?[name], RN ?Phone Number: ?06/16/2021, 11:46 AM ? ?Clinical Narrative:    ?Unsuccessful attempt to reach [facility] in admissions at [facility]. Left a message requesting update on [employer] auth. Spoke with [name] patient's duaghter to advise auth still pending. Family will transport to SNF.  ? ? ?Expected Discharge Plan: [place] ?Barriers to Discharge: Continued Medical Work up ? ?Expected Discharge Plan and Services ?Expected Discharge Plan: [place] ?  ?Discharge Planning Services: CM Consult ?Post Acute Care Choice: [place] ?Living arrangements for the past 2 months: [place] ?Expected Discharge Date: 06/15/21               ?DME Arranged: N/A ?DME Agency: NA ?  ?  ?  ?HH Arranged: NA ?[place] Agency: NA ?  ?  ?  ? ? ?Social Determinants of Health (SDOH) Interventions ?  ? ?Readmission Risk Interventions ?   ? View : No data to display.  ?  ?  ?  ? ? ?

## 2021-06-16 NOTE — Progress Notes (Signed)
Physical Therapy Treatment ?Patient Details ?Name: David [name] ?MRN: [id number] ?DOB: 09/26/1940 ?Today's Date: 06/16/2021 ? ? ?History of Present Illness Pt is an 81 y/o M admitted on 06/10/21 after presenting to the ED with c/o acute onset constipation with associated anorexia & diminished appetitie with subsequent 40 lbs weight loss & failure to thrive. Abdominal and pelvic CT scan revealed fecal impaction with rectal wall thickening compatible with stercoral colitis. PMH: bipolar disorder, GERD, OA, allergic rhinitis ? ?  ?PT Comments  ? ? Pt was sitting in recliner upon arriving. He is A and O x 4 and agreeable to session. Pt was easily and safely able to stand and ambulate with use of RW. No LOB or safety concerns throughout session. Pt/family are planning to DC to rehab tomorrow. He did not need any physical assistance throughout session. Follow Mds recommendations for DC recommendations. ?  ?Recommendations for follow up therapy are one component of a multi-disciplinary discharge planning process, led by the attending physician.  Recommendations may be updated based on patient status, additional functional criteria and insurance authorization. ? ?Follow Up Recommendations ? Follow physician's recommendations for discharge plan and follow up therapies ?  ?  ?Assistance Recommended at Discharge Set up Supervision/Assistance  ?Patient can return home with the following A little help with walking and/or transfers;A little help with bathing/dressing/bathroom;Assistance with cooking/housework;Direct supervision/assist for financial management;Direct supervision/assist for medications management;Help with stairs or ramp for entrance;Assist for transportation ?  ?Equipment Recommendations ? Rolling walker (2 wheels)  ?  ?   ?Precautions / Restrictions Precautions ?Precautions: Fall ?Restrictions ?Weight Bearing Restrictions: No  ?  ? ?Mobility ? Bed Mobility ?   ?General bed mobility comments: in recliner pre/post  session ?  ? ?Transfers ?Overall transfer level: Modified independent ?Equipment used: Rolling walker (2 wheels) ?Transfers: Sit to/from Stand ?Sit to Stand: Modified independent (Device/Increase time) ?  ?  ?  ?  ?  ?General transfer comment: no vcs or physical assistance required to stand or sit ?  ? ?Ambulation/Gait ?Ambulation/Gait assistance: Supervision ?Gait Distance (Feet): 400 Feet ?Assistive device: Rolling walker (2 wheels) ?Gait Pattern/deviations: WFL(Within Functional Limits) ?Gait velocity: decreased ?  ?  ?General Gait Details: no LOB or safety concern with ambulation 400 ft with use of RW ? ? ?  ?Balance Overall balance assessment: Needs assistance, History of Falls ?Sitting-balance support: No upper extremity supported, Feet supported ?Sitting balance-Leahy Scale: Good ?Sitting balance - Comments: no balance deficits observed in sitting ?  ?Standing balance support: Bilateral upper extremity supported, During functional activity ?Standing balance-Leahy Scale: Good ?  ?  ?  ?  ?Cognition Arousal/Alertness: Awake/alert ?Behavior During Therapy: [facility] for tasks assessed/performed ?Overall Cognitive Status: Within Functional Limits for tasks assessed ?  ?  ? General Comments: A&O x4; good safety awareness; some increased time for processing for multi-step cues ?  ?  ? ?  ?   ?   ? ?Pertinent Vitals/Pain Pain Assessment ?Pain Assessment: No/denies pain  ? ? ? ?PT Goals (current goals can now be found in the care plan section) Acute Rehab PT Goals ?Patient Stated Goal: none stated ?Progress towards PT goals: Progressing toward goals ? ?  ?Frequency ? ? ? Min 2X/week ? ? ? ?  ?PT Plan Discharge plan needs to be updated  ? ? ?   ?AM-PAC PT "6 Clicks" Mobility   ?Outcome Measure ? Help needed turning from your back to your side while in a flat bed without using bedrails?: None ?Help  needed moving from lying on your back to sitting on the side of a flat bed without using bedrails?: None ?Help needed moving to  and from a bed to a chair (including a wheelchair)?: None ?Help needed standing up from a chair using your arms (e.g., wheelchair or bedside chair)?: None ?Help needed to walk in hospital room?: None ?Help needed climbing 3-5 steps with a railing? : A Little ?6 Click Score: 23 ? ?  ?End of Session   ?Activity Tolerance: Patient tolerated treatment well ?Patient left: in chair;with call bell/phone within reach;with chair alarm set ?Nurse Communication: Mobility status;Precautions ?PT Visit Diagnosis: Unsteadiness on feet (R26.81);Muscle weakness (generalized) (M62.81) ?  ? ? ?Time: 8828-0034 ?PT Time Calculation (min) (ACUTE ONLY): 8 min ? ?Charges:  $Gait Training: 8-22 mins          ?          ? ?Julaine Fusi PTA ?06/16/21, 3:15 PM  ? ?

## 2021-06-16 NOTE — TOC Progression Note (Signed)
Transition of Care (TOC) - Progression Note  ? ? ?Patient Details  ?Name: David [name] ?MRN: [id number] ?Date of Birth: Aug 15, 1940 ? ?Transition of Care (TOC) CM/SW Contact  ?[name], RN ?Phone Number: ?06/16/2021, 9:04 AM ? ?Clinical Narrative:    ?Contacted Tina in admissions from [facility]. [employer] authorization still pending. Otila Kluver will contact [employer] midmorning for an update. Family has been notified of pending auth.  ? ? ?Expected Discharge Plan: [place] ?Barriers to Discharge: Continued Medical Work up ? ?Expected Discharge Plan and Services ?Expected Discharge Plan: [place] ?  ?Discharge Planning Services: CM Consult ?Post Acute Care Choice: [place] ?Living arrangements for the past 2 months: [place] ?Expected Discharge Date: 06/15/21               ?DME Arranged: N/A ?DME Agency: NA ?  ?  ?  ?HH Arranged: NA ?[place] Agency: NA ?  ?  ?  ? ? ?Social Determinants of Health (SDOH) Interventions ?  ? ?Readmission Risk Interventions ?   ? View : No data to display.  ?  ?  ?  ? ? ?

## 2021-06-16 NOTE — Progress Notes (Signed)
Nutrition Follow-up ? ?DOCUMENTATION CODES:  ? ?Non-severe (moderate) malnutrition in context of chronic illness ? ?INTERVENTION:  ? ?Ensure Enlive po BID, each supplement provides 350 kcal and 20 grams of protein. ? ?Ice cream with meal trays  ? ?MVI po daily  ? ?NUTRITION DIAGNOSIS:  ? ?Moderate Malnutrition related to chronic illness (bipolar disorder) as evidenced by energy intake < or equal to 75% for > or equal to 1 month, mild fat depletion, moderate fat depletion, mild muscle depletion, moderate muscle depletion. ?-ongoing  ? ?GOAL:  ? ?Patient will meet greater than or equal to 90% of their needs ?-progressing  ? ?MONITOR:  ? ?PO intake, Supplement acceptance, Labs, Weight trends, Skin, I & O's ? ?ASSESSMENT:  ? ?81 y/o male with h/o bipolar disorder, depression s/p ECT therapy (2016), chronic constipation, BPH and GERD who is admitted with stercoral colitis. ? ?Met with pt and pt's wife in room today. Pt reports that his appetite and oral intake is improving. Pt reports that he doesn't always feel like eating, but once his food comes, he will try and eat most of it. Pt is documented to be eating 90-100% of meals; pt reports eating a [place] sandwich and an ice cream for lunch today. Pt reports that he is drinking the Ensure supplements. Pt has not been weighed since admission; RD will order daily weights. Plan is for SNF at discharge.    ? ?Medications reviewed and include: vitamin D, colace, MVI, miralax, B12 ? ?Labs reviewed: K 4.0 wnl, Mg 2.2 wnl ?Wbc- 3.2(L), Hgb 10.6(L) ? ?Diet Order:   ?Diet Order   ? ?       ?  Diet regular Room service appropriate? Yes; Fluid consistency: Thin  Diet effective now       ?  ? ?  ?  ? ?  ? ?EDUCATION NEEDS:  ? ?Education needs have been addressed ? ?Skin:  Skin Assessment: Reviewed RN Assessment ? ?Last BM:  4/25 ? ?Height:  ? ?Ht Readings from Last 1 Encounters:  ?06/10/21 $RemoveBe'5\' 9"'vFfPYyXTv$  (1.753 m)  ? ? ?Weight:  ? ?Wt Readings from Last 1 Encounters:  ?06/10/21 58.5 kg   ? ? ?Ideal Body Weight:  72.7 kg ? ?BMI:  Body mass index is 19.04 kg/m?. ? ?Estimated Nutritional Needs:  ? ?Kcal:  1700-1900kcal/day ? ?Protein:  85-95g/day ? ?Fluid:  1.5-1.8L/day ? ?Koleen Distance MS, RD, LDN ?Please refer to AMION for RD and/or RD on-call/weekend/after hours pager ? ?

## 2021-06-16 NOTE — Progress Notes (Signed)
?  Progress Note ? ? ?Patient: David [name] AXK:[id number] DOB: 25-Apr-1940 DOA: 06/10/2021     0 ?DOS: the patient was seen and examined on 06/16/2021 ?  ? ? ?Assessment and Plan: ?* Constipation ?Severe constipation with stercoral colitis.  Continue bowel regimen. ? ?Bipolar affective disorder in remission [facility]) ?Patient seen by psychiatry and they initially recommended trazodone and then switched over to Remeron.  Family concerned that he still has effects of Remeron in his system.  This medication was discontinued. ? ?BPH (benign prostatic hyperplasia) ?Continue Flomax. ? ?Malnutrition of moderate degree ?Moderate malnutrition related to chronic illness ? ?Frail elderly ?Currently awaiting insurance authorization for rehab.  Did better today with physical therapy. ? ? ? ? ?  ? ?Subjective: Patient answered all questions appropriately when I was in the room.  Patient's daughter stating that he still has the effects of the Remeron in his system.  Patient's wife states that his blood pressure is always on the lower side.  Patient has a little nasal congestion. ? ?Physical Exam: ?Vitals:  ? 06/15/21 1537 06/15/21 2037 06/16/21 0345 06/16/21 0811  ?BP: (!) 98/55 (!) 92/56 103/63 (!) 84/50  ?Pulse: 71 72 62 (!) 58  ?Resp: '16 16 16 18  '$ ?Temp: 99.2 ?F (37.3 ?C) 98.3 ?F (36.8 ?C) 98.1 ?F (36.7 ?C) 98.6 ?F (37 ?C)  ?TempSrc: Oral   Oral  ?SpO2: 94% 95% 94% 95%  ?Weight:      ?Height:      ? ?Physical Exam ?HENT:  ?   Head: Normocephalic.  ?   Mouth/Throat:  ?   Pharynx: No oropharyngeal exudate.  ?Eyes:  ?   General: Lids are normal.  ?   Conjunctiva/sclera: Conjunctivae normal.  ?Cardiovascular:  ?   Rate and Rhythm: Normal rate and regular rhythm.  ?   Heart sounds: Normal heart sounds, S1 normal and S2 normal.  ?Pulmonary:  ?   Breath sounds: No decreased breath sounds, wheezing, rhonchi or rales.  ?Abdominal:  ?   Palpations: Abdomen is soft.  ?   Tenderness: There is no abdominal tenderness.  ?Musculoskeletal:  ?   Right  lower leg: No swelling.  ?   Left lower leg: No swelling.  ?Skin: ?   General: Skin is warm.  ?   Findings: No rash.  ?Neurological:  ?   Mental Status: He is alert.  ?   Comments: Answers questions appropriately.  ?  ?Data Reviewed: ?Reviewed laboratory and radiological data from the hospital course ? ?Family Communication: Spoke with wife at the bedside this morning and daughter on the phone today this afternoon ? ?Disposition: ?Status is: Observation ? ?Planned Discharge Destination: Rehab awaiting insurance authorization for rehab. ? ? ?Author: ?Loletha Grayer, MD ?06/16/2021 4:04 PM ? ?For on call review [email or web address].  ?

## 2021-06-17 DIAGNOSIS — N400 Enlarged prostate without lower urinary tract symptoms: Secondary | ICD-10-CM | POA: Diagnosis not present

## 2021-06-17 DIAGNOSIS — K5901 Slow transit constipation: Secondary | ICD-10-CM | POA: Diagnosis not present

## 2021-06-17 DIAGNOSIS — Z96652 Presence of left artificial knee joint: Secondary | ICD-10-CM | POA: Diagnosis not present

## 2021-06-17 DIAGNOSIS — R5381 Other malaise: Secondary | ICD-10-CM | POA: Diagnosis not present

## 2021-06-17 DIAGNOSIS — K5900 Constipation, unspecified: Secondary | ICD-10-CM | POA: Diagnosis not present

## 2021-06-17 DIAGNOSIS — N200 Calculus of kidney: Secondary | ICD-10-CM | POA: Diagnosis not present

## 2021-06-17 DIAGNOSIS — Z87891 Personal history of nicotine dependence: Secondary | ICD-10-CM | POA: Diagnosis not present

## 2021-06-17 DIAGNOSIS — K5904 Chronic idiopathic constipation: Secondary | ICD-10-CM | POA: Diagnosis not present

## 2021-06-17 DIAGNOSIS — F319 Bipolar disorder, unspecified: Secondary | ICD-10-CM | POA: Diagnosis not present

## 2021-06-17 DIAGNOSIS — Z96611 Presence of right artificial shoulder joint: Secondary | ICD-10-CM | POA: Diagnosis not present

## 2021-06-17 DIAGNOSIS — R7611 Nonspecific reaction to tuberculin skin test without active tuberculosis: Secondary | ICD-10-CM | POA: Diagnosis not present

## 2021-06-17 DIAGNOSIS — E440 Moderate protein-calorie malnutrition: Secondary | ICD-10-CM | POA: Diagnosis not present

## 2021-06-17 DIAGNOSIS — R42 Dizziness and giddiness: Secondary | ICD-10-CM | POA: Diagnosis not present

## 2021-06-17 DIAGNOSIS — R54 Age-related physical debility: Secondary | ICD-10-CM | POA: Diagnosis not present

## 2021-06-17 DIAGNOSIS — N401 Enlarged prostate with lower urinary tract symptoms: Secondary | ICD-10-CM | POA: Diagnosis not present

## 2021-06-17 DIAGNOSIS — N138 Other obstructive and reflux uropathy: Secondary | ICD-10-CM | POA: Diagnosis not present

## 2021-06-17 DIAGNOSIS — Z79899 Other long term (current) drug therapy: Secondary | ICD-10-CM | POA: Diagnosis not present

## 2021-06-17 DIAGNOSIS — F3170 Bipolar disorder, currently in remission, most recent episode unspecified: Secondary | ICD-10-CM | POA: Diagnosis not present

## 2021-06-17 MED ORDER — SENNA 8.6 MG PO TABS: 2.0000 | ORAL_TABLET | Freq: Every day | ORAL | 0 refills | Status: AC

## 2021-06-17 MED ORDER — ENSURE ENLIVE PO LIQD: 237.0000 mL | Freq: Two times a day (BID) | ORAL | 12 refills | Status: AC

## 2021-06-17 NOTE — TOC Transition Note (Signed)
Transition of Care (TOC) - CM/SW Discharge Note ? ? ?Patient Details  ?Name: TELLER WAKEFIELD ?MRN: [id number] ?Date of Birth: Apr 13, 1940 ? ?Transition of Care (TOC) CM/SW Contact:  ?[name], RN ?Phone Number: ?06/17/2021, 12:56 PM ? ? ?Clinical Narrative:    ?Discharge order received, Summary sent by email to [email or web address]. Confirmed d/c summary received. Family notified. Nurse provided room number and where to call report. TOC signing off.  ? ? ?  ?Barriers to Discharge: Continued Medical Work up ? ? ?Patient Goals and CMS Choice ?Patient states their goals for this hospitalization and ongoing recovery are:: short term rehab ?CMS Medicare.gov Compare Post Acute Care list provided to:: Patient ?Choice offered to / list presented to : Patient ? ?Discharge Placement ?  ?           ?  ?  ?  ?  ? ?Discharge Plan and Services ?  ?Discharge Planning Services: CM Consult ?Post Acute Care Choice: [place]          ?DME Arranged: N/A ?DME Agency: NA ?  ?  ?  ?HH Arranged: NA ?[place] Agency: NA ?  ?  ?  ? ?Social Determinants of Health (SDOH) Interventions ?  ? ? ?Readmission Risk Interventions ?   ? View : No data to display.  ?  ?  ?  ? ? ? ? ? ?

## 2021-06-17 NOTE — TOC Progression Note (Addendum)
Transition of Care (TOC) - Progression Note  ? ? ?Patient Details  ?Name: HOYLE BARKDULL ?MRN: [id number] ?Date of Birth: [birth year] ? ?Transition of Care (TOC) CM/SW Contact  ?[name], RN ?Phone Number: ?06/17/2021, 9:43 AM ? ?Clinical Narrative:    ?Spoke with Otila Kluver in admissions. Patient auth for [facility] was approved. Per Otila Kluver patient can discharge to facility today.  ? ? ?Expected Discharge Plan: [place] ?Barriers to Discharge: Continued Medical Work up ? ?Expected Discharge Plan and Services ?Expected Discharge Plan: [place] ?  ?Discharge Planning Services: CM Consult ?Post Acute Care Choice: [place] ?Living arrangements for the past 2 months: [place] ?Expected Discharge Date: 06/15/21               ?DME Arranged: N/A ?DME Agency: NA ?  ?  ?  ?HH Arranged: NA ?[place] Agency: NA ?  ?  ?  ? ? ?Social Determinants of Health (SDOH) Interventions ?  ? ?Readmission Risk Interventions ?   ? View : No data to display.  ?  ?  ?  ? ? ?

## 2021-06-17 NOTE — Progress Notes (Signed)
Patient ID: David [name], male   DOB: 1940/03/29, 81 y.o.   MRN: [id number] ?Report called to [facility] and given to [place]. ? ?Haydee Salter, RN ? ?

## 2021-06-17 NOTE — Discharge Summary (Signed)
?Physician Discharge Summary ?  ?Patient: David [name] MRN: [id number] DOB: 22-Jan-1941  ?Admit date:     06/10/2021  ?Discharge date: 06/17/21  ?Discharge Physician: Loletha Grayer  ? ?PCP: Leonel Ramsay, MD  ? ?Recommendations at discharge:  ? ? Follow up with team at rehab one day ? ?Discharge Diagnoses: ?Principal Problem: ?  Constipation ?Active Problems: ?  Bipolar affective disorder in remission [facility]) ?  BPH (benign prostatic hyperplasia) ?  Malnutrition of moderate degree ?  Frail elderly ? ? ? ?Hospital Course: ?The patient was admitted to the hospital on 06/11/2021 for constipation, not eating well and not having weight loss.  The patient was started on a bowel regimen with senna Colace and MiraLAX.  Psychiatry was consulted for bipolar disorder.  Initially they started trazodone and switch over to Remeron.  This medications were discontinued.  The patient is notably medications at this point for bipolar disorder.  Physical therapy recommended rehab.  The patient's blood pressure is normally on the lower side.  I recommend sitting at the edge of the bed for a few minutes before standing up and walking. ? ?Assessment and Plan: ?* Constipation ?Severe constipation with stercoral colitis.  Continue bowel regimen with Colace and senna and as needed MiraLAX ? ?Bipolar affective disorder in remission [facility]) ?Patient seen by psychiatry and they initially recommended trazodone and then switched over to Remeron.  Family concerned that he still has effects of Remeron in his system.  This medication was discontinued. ? ?BPH (benign prostatic hyperplasia) ?Continue Flomax. ? ?Malnutrition of moderate degree ?Moderate malnutrition related to chronic illness.  Continue supplements. ? ?Frail elderly ?Received insurance authorization for rehab. ? ? ? ? ?  ? ? ?Consultants: Psychiatry ?Procedures performed: None ?Disposition: Rehabilitation facility ?Diet recommendation:  ?Regular diet ?DISCHARGE MEDICATION: ?Allergies  as of 06/17/2021   ? ?   Reactions  ? Remeron [mirtazapine]   ? Hallucination very sleepy, dizzy per daughter  ? Quetiapine Anxiety  ? Restlessness, worsening anxiety  ?Restlessness, worsening anxiety   ? ?  ? ?  ?Medication List  ?  ? ?TAKE these medications   ? ?acetaminophen 500 MG tablet ?Commonly known as: TYLENOL ?Take 500 mg by mouth every 8 (eight) hours as needed for moderate pain. ?  ?Cholecalciferol 25 MCG (1000 UT) capsule ?Take 2,000 Units by mouth daily. ?  ?cyanocobalamin 1000 MCG tablet ?Take 1,000 mcg by mouth daily. ?  ?docusate sodium 100 MG capsule ?Commonly known as: COLACE ?Take 2 capsules (200 mg total) by mouth 2 (two) times daily. ?  ?feeding supplement Liqd ?Take 237 mLs by mouth 2 (two) times daily between meals. ?  ?fluticasone 50 MCG/ACT nasal spray ?Commonly known as: FLONASE ?Place 2 sprays into both nostrils daily. ?  ?Lutein 6 MG Caps ?Take 1 capsule by mouth daily. ?  ?multivitamin tablet ?Take 1 tablet by mouth daily. ?  ?multivitamin-lutein Caps capsule ?Take 1 capsule by mouth daily. ?  ?polyethylene glycol 17 g packet ?Commonly known as: MIRALAX / GLYCOLAX ?Take 17 g by mouth daily as needed for moderate constipation or severe constipation. ?  ?senna 8.6 MG Tabs tablet ?Commonly known as: SENOKOT ?Take 2 tablets (17.2 mg total) by mouth at bedtime. ?  ?tamsulosin 0.4 MG Caps capsule ?Commonly known as: FLOMAX ?Take 0.4 mg by mouth daily. ?  ? ?  ? ? Follow-up Information   ? ? Leonel Ramsay, MD Follow up in 1 week(s).   ?Specialty: Infectious Diseases ?Contact information: ?[address] ?[place] [address] ?[phone] ? ? ?  ?  ? ?  ?  ? ?  ? ?Discharge Exam: ?Filed Weights  ? 06/10/21 1920 06/17/21 0600  ?Weight: 58.5 kg 62.7 kg  ? ?Physical Exam ?HENT:  ?   Head: Normocephalic.  ?   Mouth/Throat:  ?   Pharynx: No oropharyngeal exudate.  ?Eyes:  ?   General: Lids are normal.  ?   Conjunctiva/sclera: Conjunctivae normal.  ?Cardiovascular:  ?   Rate and Rhythm:  Normal rate and regular rhythm.  ?   Heart sounds: Normal heart sounds, S1 normal and S2 normal.  ?Pulmonary:  ?   Breath sounds: No decreased breath sounds, wheezing, rhonchi or rales.  ?Abdominal:  ?   Palpations: Abdomen is soft.  ?   Tenderness: There is no abdominal tenderness.  ?Musculoskeletal:  ?   Right lower leg: No swelling.  ?   Left lower leg: No swelling.  ?Skin: ?   General: Skin is warm.  ?   Findings: No rash.  ?Neurological:  ?   Mental Status: He is alert.  ?   Comments: Answers questions appropriately.  Patient was able to stand up from the chair for me twice without any help while I was in the room with him.  ?  ? ?Condition at discharge: stable ? ?The results of significant diagnostics from this hospitalization (including imaging, microbiology, ancillary and laboratory) are listed below for reference.  ? ?Imaging Studies: ?CT Head Wo Contrast ? ?Result Date: 06/10/2021 ?CLINICAL DATA:  Head trauma. EXAM: CT HEAD WITHOUT CONTRAST TECHNIQUE: Contiguous axial images were obtained from the base of the skull through the vertex without intravenous contrast. RADIATION DOSE REDUCTION: This exam was performed according to the departmental dose-optimization program which includes automated exposure control, adjustment of the mA and/or kV according to patient size and/or use of iterative reconstruction technique. COMPARISON:  Head CT dated 01/31/2019. FINDINGS: Brain: Mild age-related atrophy and chronic microvascular ischemic changes. There is no acute intracranial hemorrhage. No mass effect or midline shift. No extra-axial fluid collection. Vascular: No hyperdense vessel or unexpected calcification. Skull: Normal. Negative for fracture or focal lesion. Sinuses/Orbits: No acute finding. Other: None IMPRESSION: 1. No acute intracranial pathology. 2. Mild age-related atrophy and chronic microvascular ischemic changes. Electronically Signed   By: Anner Crete M.D.   On: 06/10/2021 21:18  ? ?CT ABDOMEN  PELVIS W CONTRAST ? ?Result Date: 06/10/2021 ?CLINICAL DATA:  Weakness, decreased appetite, last bowel movement 2 days ago EXAM: CT ABDOMEN AND PELVIS WITH CONTRAST TECHNIQUE: Multidetector CT imaging of the abdomen and pelvis was performed using the standard protocol following bolus administration of intravenous contrast. RADIATION DOSE REDUCTION: This exam was performed according to the departmental dose-optimization program which includes automated exposure control, adjustment of the mA and/or kV according to patient size and/or use of iterative reconstruction technique. CONTRAST:  118m OMNIPAQUE IOHEXOL 300 MG/ML  SOLN COMPARISON:  09/04/2018 FINDINGS: Lower chest: No acute pleural or parenchymal lung disease. Hepatobiliary: Multiple small benign hepatic cysts, stable. No further imaging workup is required. Otherwise the liver is unremarkable. No biliary duct dilation. The gallbladder is unremarkable without cholelithiasis or cholecystitis. Pancreas: Unremarkable. No pancreatic ductal dilatation or surrounding inflammatory changes. Spleen: Normal in size without focal abnormality. Adrenals/Urinary Tract: There are multiple nonobstructing left renal calculi largest measuring 5 mm in the lower pole. No right-sided calculi. No obstructive uropathy within either kidney. Bilateral renal cortical thinning. The adrenals and bladder are unremarkable. Stomach/Bowel: No  bowel obstruction or ileus. There is a large amount of retained stool within the rectal vault, consistent with fecal impaction. Mild rectal wall thickening consistent with stercoral colitis. Significant fecal retention within the remainder of the colon consistent with constipation. No evidence of small-bowel obstruction. Vascular/Lymphatic: Aortic atherosclerosis. No enlarged abdominal or pelvic lymph nodes. Reproductive: Enlarged prostate, stable. Other: No free fluid or free intraperitoneal gas. No abdominal wall hernia. Musculoskeletal: No acute or  destructive bony lesions. Reconstructed images demonstrate no additional findings. IMPRESSION: 1. Fecal impaction, with rectal wall thickening compatible with stercoral colitis. 2. Significant fecal retenti

## 2021-06-17 NOTE — Plan of Care (Signed)
Patient ID: David [name], male   DOB: 1940-04-23, 81 y.o.   MRN: [id number] ? ?Problem: Education: ?Goal: Knowledge of General Education information will improve ?Description: Including pain rating scale, medication(s)/side effects and non-pharmacologic comfort measures ?Outcome: Adequate for Discharge ?  ?Problem: Health Behavior/Discharge Planning: ?Goal: Ability to manage health-related needs will improve ?Outcome: Adequate for Discharge ?  ?Problem: Clinical Measurements: ?Goal: Ability to maintain clinical measurements within normal limits will improve ?Outcome: Adequate for Discharge ?Goal: Will remain free from infection ?Outcome: Adequate for Discharge ?Goal: Diagnostic test results will improve ?Outcome: Adequate for Discharge ?Goal: Respiratory complications will improve ?Outcome: Adequate for Discharge ?Goal: Cardiovascular complication will be avoided ?Outcome: Adequate for Discharge ?  ?Problem: Activity: ?Goal: Risk for activity intolerance will decrease ?Outcome: Adequate for Discharge ?  ?Problem: Nutrition: ?Goal: Adequate nutrition will be maintained ?Outcome: Adequate for Discharge ?  ?Problem: Coping: ?Goal: Level of anxiety will decrease ?Outcome: Adequate for Discharge ?  ?Problem: Elimination: ?Goal: Will not experience complications related to bowel motility ?Outcome: Adequate for Discharge ?Goal: Will not experience complications related to urinary retention ?Outcome: Adequate for Discharge ?  ?Problem: Pain Managment: ?Goal: General experience of comfort will improve ?Outcome: Adequate for Discharge ?  ?Problem: Safety: ?Goal: Ability to remain free from injury will improve ?Outcome: Adequate for Discharge ?  ?Problem: Skin Integrity: ?Goal: Risk for impaired skin integrity will decrease ?Outcome: Adequate for Discharge ?  ? ?Haydee Salter, RN ? ?

## 2021-06-24 DIAGNOSIS — F319 Bipolar disorder, unspecified: Secondary | ICD-10-CM | POA: Diagnosis not present

## 2021-06-24 DIAGNOSIS — K5901 Slow transit constipation: Secondary | ICD-10-CM | POA: Diagnosis not present

## 2021-06-28 ENCOUNTER — Other Ambulatory Visit: Payer: Self-pay

## 2021-06-28 NOTE — Patient Outreach (Signed)
[place] [facility]) Care Management ? ?06/28/2021 ? ?Odella Aquas ?08/22/1940 ?[id number] ? ? ? ? ?Transition of Care Referral ? ?Referral Date: 06/28/2021 ?Referral Source:Discharge Report ?Date of Discharge: 06/25/2021 ?Facility: [facility] ?I ? ? ? ?Outreach attempt # 1 to patient. No answer at present and unable to leave message. ? ? ? ?Plan: ?RN CM will make outreach attempt within 4 business days.  ? ? ?Enzo Montgomery, RN,BSN,CCM ?[facility] Care Management ?Telephonic Care Management Coordinator ?Direct Phone: [phone] ?Toll Free: [phone] ?Fax: [phone] ? ?

## 2021-06-28 NOTE — Patient Outreach (Signed)
[place] [facility]) Care Management ? ?06/28/2021 ? ?Odella Aquas ?Sep 12, 1940 ?[id number] ? ? ?[employer] member referral from SNF. Request sent to Anette Guarneri, RN for follow up. ? ?Ina Homes ?[facility]-Care Management Assistant ?[phone]  ?

## 2021-06-29 ENCOUNTER — Other Ambulatory Visit: Payer: Self-pay

## 2021-06-29 NOTE — Patient Outreach (Signed)
[facility]) Care Management ? ?06/29/2021 ? ?Odella Aquas ?05-28-1940 ?[id number] ? ? ?Transition of Care Referral ?  ?Referral Date: 06/28/2021 ?Referral Source:Discharge Report ?Date of Discharge: 06/25/2021 ?Facility: [facility] ? ? ?Outreach attempt #2 to patient. Call went straight to voicemail. ? ? ? ?Plan: ?RN CM will make outreach attempt within 4 business days. ? ? ?Enzo Montgomery, RN,BSN,CCM ?[facility] Care Management ?Telephonic Care Management Coordinator ?Direct Phone: [phone] ?Toll Free: [phone] ?Fax: [phone] ? ? ? ?

## 2021-06-30 ENCOUNTER — Other Ambulatory Visit: Payer: Self-pay

## 2021-06-30 NOTE — Patient Outreach (Signed)
[place] [facility]) Care Management ? ?06/30/2021 ? ?Odella Aquas ?03/01/1940 ?[id number] ? ? ?Transition of Care Referral ?  ?Referral Date: 06/28/2021 ?Referral Source:Discharge Report ?Date of Discharge: 06/25/2021 ?Facility: [facility] ? ?Unsuccessful outreach attempt #3. ? ? ? ?Plan: ?RN CM will make outreach attempt to patient within 3-4 wks if no response from letter mailed to patient. ? ?Enzo Montgomery, RN,BSN,CCM ?[facility] Care Management ?Telephonic Care Management Coordinator ?Direct Phone: [phone] ?Toll Free: [phone] ?Fax: [phone] ? ?

## 2021-07-06 ENCOUNTER — Ambulatory Visit: Payer: Medicare HMO | Attending: Infectious Diseases | Admitting: Physical Therapy

## 2021-07-06 DIAGNOSIS — M6281 Muscle weakness (generalized): Secondary | ICD-10-CM | POA: Diagnosis not present

## 2021-07-06 DIAGNOSIS — Z9181 History of falling: Secondary | ICD-10-CM

## 2021-07-06 DIAGNOSIS — R2689 Other abnormalities of gait and mobility: Secondary | ICD-10-CM

## 2021-07-06 DIAGNOSIS — R269 Unspecified abnormalities of gait and mobility: Secondary | ICD-10-CM | POA: Diagnosis not present

## 2021-07-06 NOTE — Patient Instructions (Signed)
Access Code: [email or web address] ?URL: [email or web address] ?Date: 07/06/2021 ?Prepared by: Dorcas Carrow ? ?Exercises ?- Seated March  - 2 x daily - 7 x weekly - 1 sets - 10 reps ?- Seated Long Arc Quad  - 2 x daily - 7 x weekly - 1 sets - 10 reps ?- Sit to Stand with Counter Support  - 2 x daily - 7 x weekly - 1 sets - 10 reps ?- Seated Shoulder Flexion  - 2 x daily - 7 x weekly - 1 sets - 10 reps ?

## 2021-07-08 ENCOUNTER — Encounter: Payer: Medicare HMO | Admitting: Physical Therapy

## 2021-07-08 ENCOUNTER — Ambulatory Visit: Payer: Medicare HMO | Admitting: Physical Therapy

## 2021-07-08 DIAGNOSIS — D649 Anemia, unspecified: Secondary | ICD-10-CM | POA: Diagnosis not present

## 2021-07-08 DIAGNOSIS — K5649 Other impaction of intestine: Secondary | ICD-10-CM | POA: Diagnosis not present

## 2021-07-08 DIAGNOSIS — F319 Bipolar disorder, unspecified: Secondary | ICD-10-CM | POA: Diagnosis not present

## 2021-07-08 DIAGNOSIS — Z9181 History of falling: Secondary | ICD-10-CM | POA: Diagnosis not present

## 2021-07-08 DIAGNOSIS — R2689 Other abnormalities of gait and mobility: Secondary | ICD-10-CM | POA: Diagnosis not present

## 2021-07-08 DIAGNOSIS — M6281 Muscle weakness (generalized): Secondary | ICD-10-CM | POA: Diagnosis not present

## 2021-07-08 DIAGNOSIS — R269 Unspecified abnormalities of gait and mobility: Secondary | ICD-10-CM | POA: Diagnosis not present

## 2021-07-08 DIAGNOSIS — R634 Abnormal weight loss: Secondary | ICD-10-CM | POA: Diagnosis not present

## 2021-07-08 DIAGNOSIS — K5900 Constipation, unspecified: Secondary | ICD-10-CM | POA: Diagnosis not present

## 2021-07-09 ENCOUNTER — Encounter: Payer: Self-pay | Admitting: Physical Therapy

## 2021-07-09 NOTE — Therapy (Signed)
OUTPATIENT PHYSICAL THERAPY LOWER EXTREMITY TREATMENT   Patient Name: David [name] MRN: [id number] DOB:1940/07/31, 81 y.o., male Today's Date: 07/09/2021   PT End of Session - 07/09/21 1427     Visit Number 2    Number of Visits 12    Date for PT Re-Evaluation 08/17/21    Authorization Type 2 of 11 visits (5/16 to 08/12/21)    PT Start Time 1339    PT Stop Time 1433    PT Time Calculation (min) 54 min    Equipment Utilized During Treatment Gait belt    Activity Tolerance Patient tolerated treatment well;Patient limited by fatigue    Behavior During Therapy Flat affect             Past Medical History:  Diagnosis Date   Allergic rhinitis    Anemia    Bipolar disorder ([place])    Elevated PSA    had biopsy at [facility] around 2009-2010 negative for malignancy   Erectile dysfunction    GERD (gastroesophageal reflux disease)    Leucopenia    Osteoarthritis    in hands   Prostatitis    Sigmoid polyp    Past Surgical History:  Procedure Laterality Date   COLONOSCOPY  10/20/12   HERNIA REPAIR     Left knee replacement     Right shoulder replacement     Patient Active Problem List   Diagnosis Date Noted   Frail elderly 06/16/2021   Malnutrition of moderate degree 06/12/2021   Bipolar affective disorder in remission ([place]) 06/11/2021   BPH (benign prostatic hyperplasia) 06/11/2021   Constipation 06/10/2021   Abnormal SPEP 10/19/2018   Lymphopenia 06/07/2016   Abnormal weight loss 06/07/2016   Community acquired pneumonia of left lower lobe of lung    Pneumonia of left lower lobe due to Streptococcus pneumoniae ([place])    Acute respiratory failure ([place])    Hypotension 02/29/2016   IDA (iron deficiency anemia) 06/19/2014   Leucopenia 06/19/2014    PCP: Cheral Marker.Ola Spurr, MD  REFERRING PROVIDER: Cheral Marker.Ola Spurr, MD  REFERRING DIAG: Hamstring tightness  THERAPY DIAG:  Balance disorder  Muscle weakness (generalized)  History of falling  Gait  difficulty  Rationale for Evaluation and Treatment Rehabilitation  ONSET DATE: 02/21/21  SUBJECTIVE:   SUBJECTIVE STATEMENT: Pts. Wife Kennyth Lose) brought pt. To PT tx. Today.  Pt. Arrived more upbeat/alert than initial evaluation.  Pt. Entered PT with use of SPC today, not rollator.  No falls or pain reported.   PERTINENT HISTORY: David [name] is a 81 y.o. male here for new patient evaluation. Chief Complaint Chief Complaint  Patient presents with   Establish Care  Former [facility] Patient   HPI He is here to establish care. Forms Dr Raechel Ache patient. Ilda Basset is married - his wife Malachi Bonds is my patient. Retired from Press photographer at [employer]. Does not drink alcohol. Non-smoker. Married 41 years, 3 daughters, 65 grandchildren 29 great grand children.  He has a history of multiple medical problems including hyperlipidemia, arthritis, BPH, 12 deficiency, polyneuropathy, history of nephrolithiasis, mild cognitive impairment, vitamin B-12 deficiency. He is followed in the past with neurology for peripheral neuropathy and is treated with physical therapy. He is also followed with them for mild cognitive impairment. For B12 deficiency he was taking B12 oral supplements.  BPH he is on tamsulosin and follows with urology. For his history of nephrolithiasis he underwent cystoscopy and stent placement in 2021 and then stent removal. He seems to be doing well with this. He  has chronic low back pain and is followed with physiatry and had L5-S1 injections.  In August he was seen for worsening depression. He had no motivation. He had recently had a hamstring injury and was dealing with a lot of pain. He also was not eating and was listless. Patient at that time started on Remeron and home health was referred. His hamstring injury is doing better and healed.  Today his wife is with him. She reports he stopped all his medications including the Remeron. The only thing he is currently taking is the Flomax and  vitamin supplements.  He is having loss of appetite. Food does not taste the same. He is eating one meal a day, eating only a little at other times. Food does not taste the same. No dysphagia, no odynophagia. NO abd pain, no early satiety.  He is having insomnia and then not able to get out of bed in morning. He has depression and anhedonia. He previously was on remeron but had hallucinations so he is off it now.   He has a long hx of depression off and on and was followed at [facility]. Has been told he had bipolar as well. He has had therapy as well. He has been on over the years remeron, trazodone, wellbutrin, depakote, lamictal, lithium, zoloft, prozac, olanazpine, abilify, latuda, ritalin, nortriptyline.   Problem List Patient Active Problem List  Diagnosis   Hypercholesterolemia   Vasomotor rhinitis   Macular degeneration   Primary osteoarthritis of left knee   Benign prostatic hyperplasia without lower urinary tract symptoms   Vitamin D deficiency   Mild chronic anemia, unspecified   Leucopenia   Atherosclerosis of abdominal aorta (CMS-HCC)   Elevated PSA, less than 10 ng/ml   Proximal muscle weakness   Edema, lower extremity   Neuropathic pain of both feet   Leg cramps   B12 deficiency   Mixed sensory-motor polyneuropathy   Nephrolithiasis   Primary osteoarthritis of first carpometacarpal joint of right hand   Mild cognitive impairment   Erectile dysfunction due to arterial insufficiency   Adjustment disorder with mixed anxiety and depressed mood   Past Medical History:  Diagnosis Date   Abnormal weight loss 06/07/2016   Anemia   Bipolar affective disorder (CMS-HCC) 09/11/2012   Chronic tension-type headache, not intractable 04/13/2017   COVID-19 virus infection 02/24/2019   Depression   ED (erectile dysfunction)   Foot drop, left 12/17/2013   History of elevated PSA  S/P biopsy 04/2004   Hx of adenomatous polyp of colon 05/19/2008   Hypercholesterolemia    Kidney stone  history of obstruction with urosepsis; required stent 02/26/2019   Macular degeneration   Mixed sensory-motor polyneuropathy 01/06/2014  on 01/06/2014 lower extremity EMG/NCVs   Mixed sensory-motor polyneuropathy   Non-traumatic rhabdomyolysis 08/13/2018   OA (osteoarthritis) of knee  left   Vasomotor rhinitis   Vitamin D deficiency   Past Surgical History:  Procedure Laterality Date   BIOPSY PROSTATE NEEDLE/PUNCH 7/06   CARPAL TUNNEL RELEASE   CYSTOSCOPY W/INSERTION/EXCHANGE URETERAL STENT Left 02/26/2019  for obstructing stone, with urosepsis   ENDOSCOPIC CARPAL TUNNEL RELEASE Bilateral 1993   INGUINAL HERNIA REPAIR Left 2004   JOINT REPLACEMENT shoulder and knee   kidney stone removal   kidney surgery 2021   REPAIR ROTATOR CUFF TEAR CHRONIC OPEN Right 2008  unsuccessful   REPLACEMENT TOTAL KNEE Left 2006   TONSILLECTOMY 1942   TOTAL SHOULDER REPLACEMENT Right 2009   Social History Social History  Socioeconomic History   Marital status: Married  Occupational History   Occupation: Retired  Tobacco Use   Smoking status: Former  Packs/day: 1.00  Years: 19.00  Pack years: 19.00  Types: Cigarettes  Quit date: 02/21/1974  Years since quitting: 46.9   Smokeless tobacco: Never   Tobacco comments:  quit 1976  Vaping Use   Vaping Use: Never used  Substance and Sexual Activity   Alcohol use: Not Currently  Alcohol/week: 0.0 standard drinks  Comment: rare wine   Drug use: No   Sexual activity: Not Currently  Partners: Female  Social History Narrative  Lives with wife   Education: Some Secretary/administrator  Occupation: Retired  Hobbies: na  Marital Status: married   Family History Family History  Problem Relation Age of Onset   Heart failure Father   Coronary Artery Disease (Blocked arteries around heart) Father   High blood pressure (Hypertension) Father   Myocardial Infarction (Heart attack) Father   Breast cancer Mother   Stroke Mother   High blood  pressure (Hypertension) Mother   Myasthenia gravis Mother   Arthritis Mother   Breast cancer Maternal Aunt   Breast cancer Maternal Aunt   Migraines Daughter   Colon cancer Neg Hx   Prostate cancer Neg Hx    PAIN:  Are you having pain? No  PRECAUTIONS: Fall  WEIGHT BEARING RESTRICTIONS No  FALLS:  Has patient fallen in last 6 months? Yes. Number of falls 2-3?  LIVING ENVIRONMENT: Lives with: lives with their family Lives in: House/apartment Stairs: Yes: External: 4 steps; on left going up Has following equipment at home: Gilford Rile - 4 wheeled  OCCUPATION: retired  PLOF: Needs assistance with gait  PATIENT GOALS Improve walking/ LE strengthening/ safety with walking   OBJECTIVE:   Evaluation 07/06/21  PATIENT SURVEYS:  Pt. Unable to complete FOTO.    COGNITION:  Overall cognitive status: Within functional limits for tasks assessed     SENSATION: WFL  MUSCLE LENGTH: Not tested during evaluation  POSTURE: rounded shoulders, forward head, and flexed trunk   LOWER EXTREMITY ROM:  B LE WFL, slight distal hamstring tightness bilaterally \ LOWER EXTREMITY MMT:  B LE strength grossly 4/5 MMT except hip flexion 4-/5 MMT, quad/hamstring 4+/5 MMT.    FUNCTIONAL TESTS:  5 times sit to stand: 18.38 seconds Berg Balance Scale: 46/56  GAIT: Distance walked: around clinic/ around building Assistive device utilized: Environmental consultant - 4 wheeled Level of assistance: SBA Comments: Min. To mod. Fatigue reported.  No recent falls reported.     TODAY'S TREATMENT:  07/08/21:  Therex.:   Reviewed HEP Seated marching/ LAQ/ heel raises 20x.  Standing marching/ hip extension 20x.   6" step ups in //-bars 10x on L/R.   Neuro.mm.:  Walking in //-bars (cone taps) with light to no UE assist in //-bars 3 laps. Cone pick ups in //-bars (cuing to correct posture/ technique)- pt. Reports tough getting down requiring SBA/ CGA Walking in hallway/ outside with use of SPC (grassy terrain/  curbs/ ramp).   Ascend/ descend stairs with use of L handrail to simulate home entry.       PATIENT EDUCATION:  Education details: Access Code: [email or web address] Person educated: Patient Education method: Explanation, Demonstration, and Handouts Education comprehension: verbalized understanding and returned demonstration   HOME EXERCISE PROGRAM: Access Code: [email or web address] URL: [email or web address] Date: 07/06/2021 Prepared by: Dorcas Carrow   Exercises - Seated March  - 2 x daily - 7 x weekly - 1 sets - 10 reps - Seated  Long Arc Quad  - 2 x daily - 7 x weekly - 1 sets - 10 reps - Sit to Stand with Counter Support  - 2 x daily - 7 x weekly - 1 sets - 10 reps - Seated Shoulder Flexion  - 2 x daily - 7 x weekly - 1 sets - 10 reps  ASSESSMENT:  CLINICAL IMPRESSION: Pt. Much more alert and active during tx. Session today.  Pt. Requires SBA/CGA for safety with dynamic tasks, esp. Outside walking with SPC.  No LOB during tx. Session but extra time and occasional seated rest  breaks.  Good technique/ understanding of HEP.   Pt. Benefits from use of rollator for safety with household/ outside ambulation to decrease fall risk.  Pt. Will benefit from skilled PT services to increase LE muscle strength/ dynamic balance to improve functional mobility/ safety.     OBJECTIVE IMPAIRMENTS Abnormal gait, decreased activity tolerance, decreased balance, decreased coordination, decreased endurance, decreased mobility, difficulty walking, decreased strength, decreased safety awareness, impaired perceived functional ability, impaired flexibility, impaired UE functional use, and improper body mechanics.   ACTIVITY LIMITATIONS carrying, lifting, bending, sitting, standing, squatting, stairs, transfers, and bed mobility  PARTICIPATION LIMITATIONS: cleaning, community activity, and yard work  PERSONAL FACTORS Age, Past/current experiences, Social background, and 3+ comorbidities:      REHAB POTENTIAL:  Fair (depression)  CLINICAL DECISION MAKING: Unstable/unpredictable  EVALUATION COMPLEXITY: High   GOALS: Goals reviewed with patient? Yes  SHORT TERM GOALS: Target date: 07/27/21  Pt. Will increase B LE muscle strength 1/2 muscle grade to improve functional mobility.   Baseline:  B LE strength grossly 4/5 MMT except hip flexion 4-/5 MMT, quad/hamstring 4+/5 MMT.   Goal status: INITIAL   LONG TERM GOALS: Target date: 08/17/21  Pt. Will increase Berg balance test to >50/56 to improve functional mobility.   Baseline:  Berg: 46/56 Goal status: INITIAL  2.  Pt. Will increase 5xSTS to <13 seconds to improve mobility Baseline: 18.38 seconds Goal status: INITIAL  3.  Pt. Will complete 30 minutes of there.ex./ walking with no increase c/o fatigue/ pain to improve community mobility.   Baseline:  pt. Currently not exercising Goal status: INITIAL   PLAN: PT FREQUENCY: 2x/week  PT DURATION: 6 weeks  PLANNED INTERVENTIONS: Therapeutic exercises, Therapeutic activity, Neuromuscular re-education, Balance training, Gait training, Patient/Family education, Joint mobilization, Stair training, and DME instructions  PLAN FOR NEXT SESSION: Progress LE strength/ balance activities.   Pura Spice, PT, DPT # [phone]  07/09/2021, 2:29 PM

## 2021-07-09 NOTE — Therapy (Signed)
OUTPATIENT PHYSICAL THERAPY LOWER EXTREMITY EVALUATION   Patient Name: [name] MRN: [id number] DOB:1941-02-04, 81 y.o., male Today's Date: 07/09/2021   PT End of Session - 07/09/21 1328     Visit Number 1    Number of Visits 12    Date for PT Re-Evaluation 08/17/21    Authorization Type 1 of 11 visits (5/16 to 08/12/21)    PT Start Time 1059    PT Stop Time 1159    PT Time Calculation (min) 60 min    Equipment Utilized During Treatment Gait belt    Activity Tolerance Patient tolerated treatment well;Patient limited by fatigue    Behavior During Therapy Flat affect             Past Medical History:  Diagnosis Date   Allergic rhinitis    Anemia    Bipolar disorder ([place])    Elevated PSA    had biopsy at [facility] around 2009-2010 negative for malignancy   Erectile dysfunction    GERD (gastroesophageal reflux disease)    Leucopenia    Osteoarthritis    in hands   Prostatitis    Sigmoid polyp    Past Surgical History:  Procedure Laterality Date   COLONOSCOPY  10/20/12   HERNIA REPAIR     Left knee replacement     Right shoulder replacement     Patient Active Problem List   Diagnosis Date Noted   Frail elderly 06/16/2021   Malnutrition of moderate degree 06/12/2021   Bipolar affective disorder in remission ([place]) 06/11/2021   BPH (benign prostatic hyperplasia) 06/11/2021   Constipation 06/10/2021   Abnormal SPEP 10/19/2018   Lymphopenia 06/07/2016   Abnormal weight loss 06/07/2016   Community acquired pneumonia of left lower lobe of lung    Pneumonia of left lower lobe due to Streptococcus pneumoniae ([place])    Acute respiratory failure ([place])    Hypotension 02/29/2016   IDA (iron deficiency anemia) 06/19/2014   Leucopenia 06/19/2014    PCP: Cheral Marker.Ola Spurr, MD  REFERRING PROVIDER: Cheral Marker.Ola Spurr, MD  REFERRING DIAG: Hamstring tightness  THERAPY DIAG:  Balance disorder  Muscle weakness (generalized)  History of falling  Gait  difficulty  Rationale for Evaluation and Treatment Rehabilitation  ONSET DATE: 02/21/21  SUBJECTIVE:   SUBJECTIVE STATEMENT: Pt. Arrived to PT with 2 daughters.  Pts. Dtrs. Report that pt. Was difficult getting out of bed to come to PT tx. Evaluation.  Pt. Has been depressed for a long time and pt. Tends to stay in bed/ sleep for excessive periods of time.   PERTINENT HISTORY: David [name] is a 81 y.o. male here for new patient evaluation. Chief Complaint Chief Complaint  Patient presents with   Establish Care  Former [facility] Patient   HPI He is here to establish care. Forms Dr Raechel Ache patient. David [name] is married - his wife Malachi Bonds is my patient. Retired from Press photographer at [employer]. Does not drink alcohol. Non-smoker. Married 44 years, 3 daughters, 61 grandchildren 61 great grand children.  He has a history of multiple medical problems including hyperlipidemia, arthritis, BPH, 12 deficiency, polyneuropathy, history of nephrolithiasis, mild cognitive impairment, vitamin B-12 deficiency. He is followed in the past with neurology for peripheral neuropathy and is treated with physical therapy. He is also followed with them for mild cognitive impairment. For B12 deficiency he was taking B12 oral supplements.  BPH he is on tamsulosin and follows with urology. For his history of nephrolithiasis he underwent cystoscopy and stent placement in 2021  and then stent removal. He seems to be doing well with this. He has chronic low back pain and is followed with physiatry and had L5-S1 injections.  In August he was seen for worsening depression. He had no motivation. He had recently had a hamstring injury and was dealing with a lot of pain. He also was not eating and was listless. Patient at that time started on Remeron and home health was referred. His hamstring injury is doing better and healed.  Today his wife is with him. She reports he stopped all his medications including the Remeron. The only  thing he is currently taking is the Flomax and vitamin supplements.  He is having loss of appetite. Food does not taste the same. He is eating one meal a day, eating only a little at other times. Food does not taste the same. No dysphagia, no odynophagia. NO abd pain, no early satiety.  He is having insomnia and then not able to get out of bed in morning. He has depression and anhedonia. He previously was on remeron but had hallucinations so he is off it now.   He has a long hx of depression off and on and was followed at [facility] and [facility]. Has been told he had bipolar as well. He has had therapy as well. He has been on over the years remeron, trazodone, wellbutrin, depakote, lamictal, lithium, zoloft, prozac, olanazpine, abilify, latuda, ritalin, nortriptyline.   Problem List Patient Active Problem List  Diagnosis   Hypercholesterolemia   Vasomotor rhinitis   Macular degeneration   Primary osteoarthritis of left knee   Benign prostatic hyperplasia without lower urinary tract symptoms   Vitamin D deficiency   Mild chronic anemia, unspecified   Leucopenia   Atherosclerosis of abdominal aorta (CMS-HCC)   Elevated PSA, less than 10 ng/ml   Proximal muscle weakness   Edema, lower extremity   Neuropathic pain of both feet   Leg cramps   B12 deficiency   Mixed sensory-motor polyneuropathy   Nephrolithiasis   Primary osteoarthritis of first carpometacarpal joint of right hand   Mild cognitive impairment   Erectile dysfunction due to arterial insufficiency   Adjustment disorder with mixed anxiety and depressed mood   Past Medical History:  Diagnosis Date   Abnormal weight loss 06/07/2016   Anemia   Bipolar affective disorder (CMS-HCC) 09/11/2012   Chronic tension-type headache, not intractable 04/13/2017   COVID-19 virus infection 02/24/2019   Depression   ED (erectile dysfunction)   Foot drop, left 12/17/2013   History of elevated PSA  S/P biopsy 04/2004   Hx of adenomatous  polyp of colon 05/19/2008   Hypercholesterolemia   Kidney stone  history of obstruction with urosepsis; required stent 02/26/2019   Macular degeneration   Mixed sensory-motor polyneuropathy 01/06/2014  on 01/06/2014 lower extremity EMG/NCVs   Mixed sensory-motor polyneuropathy   Non-traumatic rhabdomyolysis 08/13/2018   OA (osteoarthritis) of knee  left   Vasomotor rhinitis   Vitamin D deficiency   Past Surgical History:  Procedure Laterality Date   BIOPSY PROSTATE NEEDLE/PUNCH 7/06   CARPAL TUNNEL RELEASE   CYSTOSCOPY W/INSERTION/EXCHANGE URETERAL STENT Left 02/26/2019  for obstructing stone, with urosepsis   ENDOSCOPIC CARPAL TUNNEL RELEASE Bilateral 1993   INGUINAL HERNIA REPAIR Left 2004   JOINT REPLACEMENT shoulder and knee   kidney stone removal   kidney surgery 2021   REPAIR ROTATOR CUFF TEAR CHRONIC OPEN Right 2008  unsuccessful   REPLACEMENT TOTAL KNEE Left 2006   TONSILLECTOMY 1942  TOTAL SHOULDER REPLACEMENT Right 2009   Social History Social History   Socioeconomic History   Marital status: Married  Occupational History   Occupation: Retired  Tobacco Use   Smoking status: Former  Packs/day: 1.00  Years: 19.00  Pack years: 19.00  Types: Cigarettes  Quit date: 02/21/1974  Years since quitting: 46.9   Smokeless tobacco: Never   Tobacco comments:  quit 1976  Vaping Use   Vaping Use: Never used  Substance and Sexual Activity   Alcohol use: Not Currently  Alcohol/week: 0.0 standard drinks  Comment: rare wine   Drug use: No   Sexual activity: Not Currently  Partners: Female  Social History Narrative  Lives with wife   Education: Some Secretary/administrator  Occupation: Retired  Hobbies: na  Marital Status: married   Family History Family History  Problem Relation Age of Onset   Heart failure Father   Coronary Artery Disease (Blocked arteries around heart) Father   High blood pressure (Hypertension) Father   Myocardial Infarction (Heart attack) Father   Breast  cancer Mother   Stroke Mother   High blood pressure (Hypertension) Mother   Myasthenia gravis Mother   Arthritis Mother   Breast cancer Maternal Aunt   Breast cancer Maternal Aunt   Migraines Daughter   Colon cancer Neg Hx   Prostate cancer Neg Hx    PAIN:  Are you having pain? No  PRECAUTIONS: Fall  WEIGHT BEARING RESTRICTIONS No  FALLS:  Has patient fallen in last 6 months? Yes. Number of falls 2-3?  LIVING ENVIRONMENT: Lives with: lives with their family Lives in: House/apartment Stairs: Yes: External: 4 steps; on left going up Has following equipment at home: Gilford Rile - 4 wheeled  OCCUPATION: retired  PLOF: Needs assistance with gait  PATIENT GOALS Improve walking/ LE strengthening/ safety with walking   OBJECTIVE:   PATIENT SURVEYS:  Pt. Unable to complete FOTO.    COGNITION:  Overall cognitive status: Within functional limits for tasks assessed     SENSATION: WFL  MUSCLE LENGTH: Not tested during evaluation  POSTURE: rounded shoulders, forward head, and flexed trunk   LOWER EXTREMITY ROM:  B LE WFL, slight distal hamstring tightness bilaterally \ LOWER EXTREMITY MMT:  B LE strength grossly 4/5 MMT except hip flexion 4-/5 MMT, quad/hamstring 4+/5 MMT.    FUNCTIONAL TESTS:  5 times sit to stand: 18.38 seconds Berg Balance Scale: 46/56  GAIT: Distance walked: around clinic/ around building Assistive device utilized: Environmental consultant - 4 wheeled Level of assistance: SBA Comments: Min. To mod. Fatigue reported.  No recent falls reported.     TODAY'S TREATMENT: Evaluation/ issued HEP/ discussed walking with rollator and [facility]   PATIENT EDUCATION:  Education details: Access Code: [email or web address] Person educated: Patient Education method: Explanation, Demonstration, and Handouts Education comprehension: verbalized understanding and returned demonstration   HOME EXERCISE PROGRAM: Access Code: [email or web address] URL: [email or web address] Date:  07/06/2021 Prepared by: Dorcas Carrow   Exercises - Seated March  - 2 x daily - 7 x weekly - 1 sets - 10 reps - Seated Long Arc Quad  - 2 x daily - 7 x weekly - 1 sets - 10 reps - Sit to Stand with Counter Support  - 2 x daily - 7 x weekly - 1 sets - 10 reps - Seated Shoulder Flexion  - 2 x daily - 7 x weekly - 1 sets - 10 reps  ASSESSMENT:  CLINICAL IMPRESSION: Patient is a pleasant 81 y.o. male who was seen today for physical therapy  evaluation and treatment for balance/ gait issues.   Pt. Had a fall last year resulting in decrease mobility/ LE muscle weakness.  Pt. Presents with generalized LE muscle weakness/ moderate fatigue/ balance deficits.  Pt. Benefits from use of rollator for safety with household/ outside ambulation to decrease fall risk.  Pt. Will benefit from skilled PT services to increase LE muscle strength/ dynamic balance to improve functional mobility/ safety.     OBJECTIVE IMPAIRMENTS Abnormal gait, decreased activity tolerance, decreased balance, decreased coordination, decreased endurance, decreased mobility, difficulty walking, decreased strength, decreased safety awareness, impaired perceived functional ability, impaired flexibility, impaired UE functional use, and improper body mechanics.   ACTIVITY LIMITATIONS carrying, lifting, bending, sitting, standing, squatting, stairs, transfers, and bed mobility  PARTICIPATION LIMITATIONS: cleaning, community activity, and yard work  PERSONAL FACTORS Age, Past/current experiences, Social background, and 3+ comorbidities:      REHAB POTENTIAL: Fair (depression)  CLINICAL DECISION MAKING: Unstable/unpredictable  EVALUATION COMPLEXITY: High   GOALS: Goals reviewed with patient? Yes  SHORT TERM GOALS: Target date: 07/27/21  Pt. Will increase B LE muscle strength 1/2 muscle grade to improve functional mobility.   Baseline:  B LE strength grossly 4/5 MMT except hip flexion 4-/5 MMT, quad/hamstring 4+/5 MMT.   Goal status:  INITIAL   LONG TERM GOALS: Target date: 08/17/21  Pt. Will increase Berg balance test to >50/56 to improve functional mobility.   Baseline:  Berg: 46/56 Goal status: INITIAL  2.  Pt. Will increase 5xSTS to <13 seconds to improve mobility Baseline: 18.38 seconds Goal status: INITIAL  3.  Pt. Will complete 30 minutes of there.ex./ walking with no increase c/o fatigue/ pain to improve community mobility.   Baseline:  pt. Currently not exercising Goal status: INITIAL   PLAN: PT FREQUENCY: 2x/week  PT DURATION: 6 weeks  PLANNED INTERVENTIONS: Therapeutic exercises, Therapeutic activity, Neuromuscular re-education, Balance training, Gait training, Patient/Family education, Joint mobilization, Stair training, and DME instructions  PLAN FOR NEXT SESSION: Progress LE strength/ balance activities.   Pura Spice, PT, DPT # [phone]  07/09/2021, 1:33 PM

## 2021-07-12 ENCOUNTER — Ambulatory Visit: Payer: Medicare HMO | Admitting: Physical Therapy

## 2021-07-13 ENCOUNTER — Encounter: Payer: Self-pay | Admitting: Physical Therapy

## 2021-07-13 ENCOUNTER — Ambulatory Visit: Payer: Medicare HMO | Admitting: Physical Therapy

## 2021-07-13 DIAGNOSIS — R269 Unspecified abnormalities of gait and mobility: Secondary | ICD-10-CM

## 2021-07-13 DIAGNOSIS — M6281 Muscle weakness (generalized): Secondary | ICD-10-CM

## 2021-07-13 DIAGNOSIS — Z9181 History of falling: Secondary | ICD-10-CM

## 2021-07-13 DIAGNOSIS — R2689 Other abnormalities of gait and mobility: Secondary | ICD-10-CM | POA: Diagnosis not present

## 2021-07-13 NOTE — Therapy (Signed)
OUTPATIENT PHYSICAL THERAPY LOWER EXTREMITY TREATMENT   Patient Name: David [name] MRN: [id number] DOB:[birth year], 81 y.o., male Today's Date: 07/13/2021    PT End of Session - 07/13/21 1106     Visit Number 3    Number of Visits 12    Date for PT Re-Evaluation 08/17/21    Authorization Type 3 of 11 visits (5/16 to 08/12/21)    PT Start Time 1106 to 1200  (54 minutes)   Equipment Utilized During Treatment Gait belt    Activity Tolerance Patient tolerated treatment well;Patient limited by fatigue    Behavior During Therapy [facility] for tasks assessed/performed              Past Medical History:  Diagnosis Date   Allergic rhinitis    Anemia    Bipolar disorder ([place])    Elevated PSA    had biopsy at [facility] around 2009-2010 negative for malignancy   Erectile dysfunction    GERD (gastroesophageal reflux disease)    Leucopenia    Osteoarthritis    in hands   Prostatitis    Sigmoid polyp    Past Surgical History:  Procedure Laterality Date   COLONOSCOPY  10/20/12   HERNIA REPAIR     Left knee replacement     Right shoulder replacement     Patient Active Problem List   Diagnosis Date Noted   Frail elderly 06/16/2021   Malnutrition of moderate degree 06/12/2021   Bipolar affective disorder in remission ([place]) 06/11/2021   BPH (benign prostatic hyperplasia) 06/11/2021   Constipation 06/10/2021   Abnormal SPEP 10/19/2018   Lymphopenia 06/07/2016   Abnormal weight loss 06/07/2016   Community acquired pneumonia of left lower lobe of lung    Pneumonia of left lower lobe due to Streptococcus pneumoniae ([place])    Acute respiratory failure ([place])    Hypotension 02/29/2016   IDA (iron deficiency anemia) 06/19/2014   Leucopenia 06/19/2014    PCP: Cheral Marker.Ola Spurr, MD  REFERRING PROVIDER: Cheral Marker.Ola Spurr, MD  REFERRING DIAG: Hamstring tightness  THERAPY DIAG:  Balance disorder  Muscle weakness (generalized)  History of falling  Gait difficulty  Rationale for  Evaluation and Treatment Rehabilitation  ONSET DATE: 02/21/21  SUBJECTIVE:   SUBJECTIVE STATEMENT: Pts. Entered PT with use of SPC.  No falls or new complaints.  Pt. States he is ready for work with PT.    PERTINENT HISTORY: David [name] is a 81 y.o. male here for new patient evaluation. Chief Complaint Chief Complaint  Patient presents with   Establish Care  Former [facility] Patient   HPI He is here to establish care. Forms Dr Raechel Ache patient. David [name] is married - his wife Malachi Bonds is my patient. Retired from Press photographer at [employer]. Does not drink alcohol. Non-smoker. Married 34 years, 3 daughters, 77 grandchildren 62 great grand children.  He has a history of multiple medical problems including hyperlipidemia, arthritis, BPH, 12 deficiency, polyneuropathy, history of nephrolithiasis, mild cognitive impairment, vitamin B-12 deficiency. He is followed in the past with neurology for peripheral neuropathy and is treated with physical therapy. He is also followed with them for mild cognitive impairment. For B12 deficiency he was taking B12 oral supplements.  BPH he is on tamsulosin and follows with urology. For his history of nephrolithiasis he underwent cystoscopy and stent placement in 2021 and then stent removal. He seems to be doing well with this. He has chronic low back pain and is followed with physiatry and had L5-S1 injections.  In August he  was seen for worsening depression. He had no motivation. He had recently had a hamstring injury and was dealing with a lot of pain. He also was not eating and was listless. Patient at that time started on Remeron and home health was referred. His hamstring injury is doing better and healed.  Today his wife is with him. She reports he stopped all his medications including the Remeron. The only thing he is currently taking is the Flomax and vitamin supplements.  He is having loss of appetite. Food does not taste the same. He is eating one meal a  day, eating only a little at other times. Food does not taste the same. No dysphagia, no odynophagia. NO abd pain, no early satiety.  He is having insomnia and then not able to get out of bed in morning. He has depression and anhedonia. He previously was on remeron but had hallucinations so he is off it now.   He has a long hx of depression off and on and was followed at [facility] and [place] [facility]. Has been told he had bipolar as well. He has had therapy as well. He has been on over the years remeron, trazodone, wellbutrin, depakote, lamictal, lithium, zoloft, prozac, olanazpine, abilify, latuda, ritalin, nortriptyline.   Problem List Patient Active Problem List  Diagnosis   Hypercholesterolemia   Vasomotor rhinitis   Macular degeneration   Primary osteoarthritis of left knee   Benign prostatic hyperplasia without lower urinary tract symptoms   Vitamin D deficiency   Mild chronic anemia, unspecified   Leucopenia   Atherosclerosis of abdominal aorta (CMS-HCC)   Elevated PSA, less than 10 ng/ml   Proximal muscle weakness   Edema, lower extremity   Neuropathic pain of both feet   Leg cramps   B12 deficiency   Mixed sensory-motor polyneuropathy   Nephrolithiasis   Primary osteoarthritis of first carpometacarpal joint of right hand   Mild cognitive impairment   Erectile dysfunction due to arterial insufficiency   Adjustment disorder with mixed anxiety and depressed mood   Past Medical History:  Diagnosis Date   Abnormal weight loss 06/07/2016   Anemia   Bipolar affective disorder (CMS-HCC) 09/11/2012   Chronic tension-type headache, not intractable 04/13/2017   COVID-19 virus infection 02/24/2019   Depression   ED (erectile dysfunction)   Foot drop, left 12/17/2013   History of elevated PSA  S/P biopsy 04/2004   Hx of adenomatous polyp of colon 05/19/2008   Hypercholesterolemia   Kidney stone  history of obstruction with urosepsis; required stent 02/26/2019   Macular degeneration    Mixed sensory-motor polyneuropathy 01/06/2014  on 01/06/2014 lower extremity EMG/NCVs   Mixed sensory-motor polyneuropathy   Non-traumatic rhabdomyolysis 08/13/2018   OA (osteoarthritis) of knee  left   Vasomotor rhinitis   Vitamin D deficiency   Past Surgical History:  Procedure Laterality Date   BIOPSY PROSTATE NEEDLE/PUNCH 7/06   CARPAL TUNNEL RELEASE   CYSTOSCOPY W/INSERTION/EXCHANGE URETERAL STENT Left 02/26/2019  for obstructing stone, with urosepsis   ENDOSCOPIC CARPAL TUNNEL RELEASE Bilateral 1993   INGUINAL HERNIA REPAIR Left 2004   JOINT REPLACEMENT shoulder and knee   kidney stone removal   kidney surgery 2021   REPAIR ROTATOR CUFF TEAR CHRONIC OPEN Right 2008  unsuccessful   REPLACEMENT TOTAL KNEE Left 2006   TONSILLECTOMY 1942   TOTAL SHOULDER REPLACEMENT Right 2009   Social History Social History   Socioeconomic History   Marital status: Married  Occupational History   Occupation: Retired  Tobacco  Use   Smoking status: Former  Packs/day: 1.00  Years: 19.00  Pack years: 19.00  Types: Cigarettes  Quit date: 02/21/1974  Years since quitting: 46.9   Smokeless tobacco: Never   Tobacco comments:  quit 1976  Vaping Use   Vaping Use: Never used  Substance and Sexual Activity   Alcohol use: Not Currently  Alcohol/week: 0.0 standard drinks  Comment: rare wine   Drug use: No   Sexual activity: Not Currently  Partners: Female  Social History Narrative  Lives with wife   Education: Some Secretary/administrator  Occupation: Retired  Hobbies: na  Marital Status: married   Family History Family History  Problem Relation Age of Onset   Heart failure Father   Coronary Artery Disease (Blocked arteries around heart) Father   High blood pressure (Hypertension) Father   Myocardial Infarction (Heart attack) Father   Breast cancer Mother   Stroke Mother   High blood pressure (Hypertension) Mother   Myasthenia gravis Mother   Arthritis Mother   Breast cancer Maternal Aunt    Breast cancer Maternal Aunt   Migraines Daughter   Colon cancer Neg Hx   Prostate cancer Neg Hx    PAIN:  Are you having pain? No  PRECAUTIONS: Fall  WEIGHT BEARING RESTRICTIONS No  FALLS:  Has patient fallen in last 6 months? Yes. Number of falls 2-3?  LIVING ENVIRONMENT: Lives with: lives with their family Lives in: House/apartment Stairs: Yes: External: 4 steps; on left going up Has following equipment at home: Gilford Rile - 4 wheeled  OCCUPATION: retired  PLOF: Needs assistance with gait  PATIENT GOALS Improve walking/ LE strengthening/ safety with walking   OBJECTIVE:   Evaluation 07/06/21  PATIENT SURVEYS:  Pt. Unable to complete FOTO.    COGNITION:  Overall cognitive status: Within functional limits for tasks assessed     SENSATION: WFL  MUSCLE LENGTH: Not tested during evaluation  POSTURE: rounded shoulders, forward head, and flexed trunk   LOWER EXTREMITY ROM:  B LE WFL, slight distal hamstring tightness bilaterally \ LOWER EXTREMITY MMT:  B LE strength grossly 4/5 MMT except hip flexion 4-/5 MMT, quad/hamstring 4+/5 MMT.    FUNCTIONAL TESTS:  5 times sit to stand: 18.38 seconds Berg Balance Scale: 46/56  GAIT: Distance walked: around clinic/ around building Assistive device utilized: Environmental consultant - 4 wheeled Level of assistance: SBA Comments: Min. To mod. Fatigue reported.  No recent falls reported.     TODAY'S TREATMENT:  07/13/21:  There.ex.:  Nustep L4 10 min. B UE/LE (consistent cadence)- discussed weekend activities.    4# ankle wts.: seated marching/ LAQ/ standing heel raises/ hip abduction/ hip extension/ hamstring curls/ walking in //-bars 20x each.  Neuro.mm.:   Walking in //-bars with hurdle step overs (min. To no UE assist)- 5 laps.    Walking with cone touches (alternating)- min. UE assist.  Walking in hallway/ outside with use of SPC and consistent 2-point gait pattern on grassy terrain/ curbs/ mulch area.  No LOB but cuing to  correct BOS/ posture.   NBOS/ tandem/ SLS in //-bars   07/08/21:  Therex.:   Reviewed HEP Seated marching/ LAQ/ heel raises 20x.  Standing marching/ hip extension 20x.   6" step ups in //-bars 10x on L/R.   Neuro.mm.:  Walking in //-bars (cone taps) with light to no UE assist in //-bars 3 laps. Cone pick ups in //-bars (cuing to correct posture/ technique)- pt. Reports tough getting down requiring SBA/ CGA Walking in hallway/ outside with use  of SPC (grassy terrain/ curbs/ ramp).   Ascend/ descend stairs with use of L handrail to simulate home entry.       PATIENT EDUCATION:  Education details: Access Code: [email or web address] Person educated: Patient Education method: Explanation, Demonstration, and Handouts Education comprehension: verbalized understanding and returned demonstration   HOME EXERCISE PROGRAM: Access Code: [email or web address] URL: [email or web address] Date: 07/06/2021 Prepared by: Dorcas Carrow   Exercises - Seated March  - 2 x daily - 7 x weekly - 1 sets - 10 reps - Seated Long Arc Quad  - 2 x daily - 7 x weekly - 1 sets - 10 reps - Sit to Stand with Counter Support  - 2 x daily - 7 x weekly - 1 sets - 10 reps - Seated Shoulder Flexion  - 2 x daily - 7 x weekly - 1 sets - 10 reps  ASSESSMENT:  CLINICAL IMPRESSION: Pt. Requires SBA/CGA for safety with dynamic tasks, esp. Outside walking with SPC.  No LOB during tx. Session but extra time and verbal cuing to correct posture/ step pattern.  Pt. Benefits from use of rollator for safety with household/ outside ambulation to decrease fall risk.  Pt. Will benefit from skilled PT services to increase LE muscle strength/ dynamic balance to improve functional mobility/ safety.     OBJECTIVE IMPAIRMENTS Abnormal gait, decreased activity tolerance, decreased balance, decreased coordination, decreased endurance, decreased mobility, difficulty walking, decreased strength, decreased safety awareness, impaired perceived  functional ability, impaired flexibility, impaired UE functional use, and improper body mechanics.   ACTIVITY LIMITATIONS carrying, lifting, bending, sitting, standing, squatting, stairs, transfers, and bed mobility  PARTICIPATION LIMITATIONS: cleaning, community activity, and yard work  PERSONAL FACTORS Age, Past/current experiences, Social background, and 3+ comorbidities:      REHAB POTENTIAL: Fair (depression)  CLINICAL DECISION MAKING: Unstable/unpredictable  EVALUATION COMPLEXITY: High   GOALS: Goals reviewed with patient? Yes  SHORT TERM GOALS: Target date: 07/27/21  Pt. Will increase B LE muscle strength 1/2 muscle grade to improve functional mobility.   Baseline:  B LE strength grossly 4/5 MMT except hip flexion 4-/5 MMT, quad/hamstring 4+/5 MMT.   Goal status: INITIAL   LONG TERM GOALS: Target date: 08/17/21  Pt. Will increase Berg balance test to >50/56 to improve functional mobility.   Baseline:  Berg: 46/56 Goal status: INITIAL  2.  Pt. Will increase 5xSTS to <13 seconds to improve mobility Baseline: 18.38 seconds Goal status: INITIAL  3.  Pt. Will complete 30 minutes of there.ex./ walking with no increase c/o fatigue/ pain to improve community mobility.   Baseline:  pt. Currently not exercising Goal status: INITIAL   PLAN: PT FREQUENCY: 2x/week  PT DURATION: 6 weeks  PLANNED INTERVENTIONS: Therapeutic exercises, Therapeutic activity, Neuromuscular re-education, Balance training, Gait training, Patient/Family education, Joint mobilization, Stair training, and DME instructions  PLAN FOR NEXT SESSION: Progress LE strength/ balance activities.   Pura Spice, PT, DPT # [phone]  07/15/2021, 8:38 AM

## 2021-07-14 ENCOUNTER — Encounter: Payer: Medicare HMO | Admitting: Physical Therapy

## 2021-07-15 ENCOUNTER — Ambulatory Visit: Payer: Medicare HMO | Admitting: Physical Therapy

## 2021-07-15 ENCOUNTER — Encounter: Payer: Self-pay | Admitting: Physical Therapy

## 2021-07-15 DIAGNOSIS — R269 Unspecified abnormalities of gait and mobility: Secondary | ICD-10-CM | POA: Diagnosis not present

## 2021-07-15 DIAGNOSIS — Z9181 History of falling: Secondary | ICD-10-CM | POA: Diagnosis not present

## 2021-07-15 DIAGNOSIS — M6281 Muscle weakness (generalized): Secondary | ICD-10-CM | POA: Diagnosis not present

## 2021-07-15 DIAGNOSIS — R2689 Other abnormalities of gait and mobility: Secondary | ICD-10-CM

## 2021-07-15 NOTE — Therapy (Signed)
OUTPATIENT PHYSICAL THERAPY LOWER EXTREMITY TREATMENT   Patient Name: David [name] MRN: [id number] DOB:February 27, 1940, 81 y.o., male Today's Date: 07/15/2021   PT End of Session - 07/15/21 1429     Visit Number 4    Number of Visits 12    Date for PT Re-Evaluation 08/17/21    Authorization Type 4 of 11 visits (5/16 to 08/12/21)    PT Start Time 1429 to 1517  (48 min).     Equipment Utilized During Treatment Gait belt    Activity Tolerance Patient tolerated treatment well;Patient limited by fatigue    Behavior During Therapy WFL for tasks assessed/performed         Past Medical History:  Diagnosis Date   Allergic rhinitis    Anemia    Bipolar disorder (HCC)    Elevated PSA    had biopsy at [facility] around 2009-2010 negative for malignancy   Erectile dysfunction    GERD (gastroesophageal reflux disease)    Leucopenia    Osteoarthritis    in hands   Prostatitis    Sigmoid polyp    Past Surgical History:  Procedure Laterality Date   COLONOSCOPY  10/20/12   HERNIA REPAIR     Left knee replacement     Right shoulder replacement     Patient Active Problem List   Diagnosis Date Noted   Frail elderly 06/16/2021   Malnutrition of moderate degree 06/12/2021   Bipolar affective disorder in remission ([place]) 06/11/2021   BPH (benign prostatic hyperplasia) 06/11/2021   Constipation 06/10/2021   Abnormal SPEP 10/19/2018   Lymphopenia 06/07/2016   Abnormal weight loss 06/07/2016   Community acquired pneumonia of left lower lobe of lung    Pneumonia of left lower lobe due to Streptococcus pneumoniae ([place])    Acute respiratory failure ([place])    Hypotension 02/29/2016   IDA (iron deficiency anemia) 06/19/2014   Leucopenia 06/19/2014    PCP: Shanon Brow P.Ola Spurr, MD  REFERRING PROVIDER: Cheral Marker.Ola Spurr, MD  REFERRING DIAG: Hamstring tightness  THERAPY DIAG:  Balance disorder  Muscle weakness (generalized)  History of falling  Gait difficulty  Rationale for Evaluation and  Treatment Rehabilitation  ONSET DATE: 02/21/21  SUBJECTIVE:   SUBJECTIVE STATEMENT: Pts. Entered PT with use of SPC.  No falls or new complaints.  No pain.  Pts. Wife David [name]) states pt. Has been more active over past week and did a bunch of errands out of the house with her today.  Pt. States he may go to church this weekend.    PERTINENT HISTORY: David [name] is a 81 y.o. male here for new patient evaluation. Chief Complaint Chief Complaint  Patient presents with   Establish Care  Former [facility] Patient   HPI He is here to establish care. Forms Dr Raechel Ache patient. David [name] is married - his wife Malachi Bonds is my patient. Retired from Press photographer at [employer]. Does not drink alcohol. Non-smoker. Married 80 years, 3 daughters, 22 grandchildren 41 great grand children.  He has a history of multiple medical problems including hyperlipidemia, arthritis, BPH, 12 deficiency, polyneuropathy, history of nephrolithiasis, mild cognitive impairment, vitamin B-12 deficiency. He is followed in the past with neurology for peripheral neuropathy and is treated with physical therapy. He is also followed with them for mild cognitive impairment. For B12 deficiency he was taking B12 oral supplements.  BPH he is on tamsulosin and follows with urology. For his history of nephrolithiasis he underwent cystoscopy and stent placement in 2021 and then stent removal. He seems  to be doing well with this. He has chronic low back pain and is followed with physiatry and had L5-S1 injections.  In August he was seen for worsening depression. He had no motivation. He had recently had a hamstring injury and was dealing with a lot of pain. He also was not eating and was listless. Patient at that time started on Remeron and home health was referred. His hamstring injury is doing better and healed.  Today his wife is with him. She reports he stopped all his medications including the Remeron. The only thing he is currently taking is  the Flomax and vitamin supplements.  He is having loss of appetite. Food does not taste the same. He is eating one meal a day, eating only a little at other times. Food does not taste the same. No dysphagia, no odynophagia. NO abd pain, no early satiety.  He is having insomnia and then not able to get out of bed in morning. He has depression and anhedonia. He previously was on remeron but had hallucinations so he is off it now.   He has a long hx of depression off and on and was followed at [facility]. Has been told he had bipolar as well. He has had therapy as well. He has been on over the years remeron, trazodone, wellbutrin, depakote, lamictal, lithium, zoloft, prozac, olanazpine, abilify, latuda, ritalin, nortriptyline.   Problem List Patient Active Problem List  Diagnosis   Hypercholesterolemia   Vasomotor rhinitis   Macular degeneration   Primary osteoarthritis of left knee   Benign prostatic hyperplasia without lower urinary tract symptoms   Vitamin D deficiency   Mild chronic anemia, unspecified   Leucopenia   Atherosclerosis of abdominal aorta (CMS-HCC)   Elevated PSA, less than 10 ng/ml   Proximal muscle weakness   Edema, lower extremity   Neuropathic pain of both feet   Leg cramps   B12 deficiency   Mixed sensory-motor polyneuropathy   Nephrolithiasis   Primary osteoarthritis of first carpometacarpal joint of right hand   Mild cognitive impairment   Erectile dysfunction due to arterial insufficiency   Adjustment disorder with mixed anxiety and depressed mood   Past Medical History:  Diagnosis Date   Abnormal weight loss 06/07/2016   Anemia   Bipolar affective disorder (CMS-HCC) 09/11/2012   Chronic tension-type headache, not intractable 04/13/2017   COVID-19 virus infection 02/24/2019   Depression   ED (erectile dysfunction)   Foot drop, left 12/17/2013   History of elevated PSA  S/P biopsy 04/2004   Hx of adenomatous polyp of colon 05/19/2008    Hypercholesterolemia   Kidney stone  history of obstruction with urosepsis; required stent 02/26/2019   Macular degeneration   Mixed sensory-motor polyneuropathy 01/06/2014  on 01/06/2014 lower extremity EMG/NCVs   Mixed sensory-motor polyneuropathy   Non-traumatic rhabdomyolysis 08/13/2018   OA (osteoarthritis) of knee  left   Vasomotor rhinitis   Vitamin D deficiency   Past Surgical History:  Procedure Laterality Date   BIOPSY PROSTATE NEEDLE/PUNCH 7/06   CARPAL TUNNEL RELEASE   CYSTOSCOPY W/INSERTION/EXCHANGE URETERAL STENT Left 02/26/2019  for obstructing stone, with urosepsis   ENDOSCOPIC CARPAL TUNNEL RELEASE Bilateral 1993   INGUINAL HERNIA REPAIR Left 2004   JOINT REPLACEMENT shoulder and knee   kidney stone removal   kidney surgery 2021   REPAIR ROTATOR CUFF TEAR CHRONIC OPEN Right 2008  unsuccessful   REPLACEMENT TOTAL KNEE Left 2006   TONSILLECTOMY 1942   TOTAL SHOULDER REPLACEMENT Right  2009   Social History Social History   Socioeconomic History   Marital status: Married  Occupational History   Occupation: Retired  Tobacco Use   Smoking status: Former  Packs/day: 1.00  Years: 19.00  Pack years: 19.00  Types: Cigarettes  Quit date: 02/21/1974  Years since quitting: 46.9   Smokeless tobacco: Never   Tobacco comments:  quit 1976  Vaping Use   Vaping Use: Never used  Substance and Sexual Activity   Alcohol use: Not Currently  Alcohol/week: 0.0 standard drinks  Comment: rare wine   Drug use: No   Sexual activity: Not Currently  Partners: Female  Social History Narrative  Lives with wife   Education: Some Secretary/administrator  Occupation: Retired  Hobbies: na  Marital Status: married   Family History Family History  Problem Relation Age of Onset   Heart failure Father   Coronary Artery Disease (Blocked arteries around heart) Father   High blood pressure (Hypertension) Father   Myocardial Infarction (Heart attack) Father   Breast cancer Mother   Stroke  Mother   High blood pressure (Hypertension) Mother   Myasthenia gravis Mother   Arthritis Mother   Breast cancer Maternal Aunt   Breast cancer Maternal Aunt   Migraines Daughter   Colon cancer Neg Hx   Prostate cancer Neg Hx    PAIN:  Are you having pain? No  PRECAUTIONS: Fall  WEIGHT BEARING RESTRICTIONS No  FALLS:  Has patient fallen in last 6 months? Yes. Number of falls 2-3?  LIVING ENVIRONMENT: Lives with: lives with their family Lives in: House/apartment Stairs: Yes: External: 4 steps; on left going up Has following equipment at home: Gilford Rile - 4 wheeled  OCCUPATION: retired  PLOF: Needs assistance with gait  PATIENT GOALS Improve walking/ LE strengthening/ safety with walking   OBJECTIVE:   Evaluation 07/06/21  PATIENT SURVEYS:  Pt. Unable to complete FOTO.    COGNITION:  Overall cognitive status: Within functional limits for tasks assessed     SENSATION: WFL  MUSCLE LENGTH: Not tested during evaluation  POSTURE: rounded shoulders, forward head, and flexed trunk   LOWER EXTREMITY ROM:  B LE WFL, slight distal hamstring tightness bilaterally \ LOWER EXTREMITY MMT:  B LE strength grossly 4/5 MMT except hip flexion 4-/5 MMT, quad/hamstring 4+/5 MMT.    FUNCTIONAL TESTS:  5 times sit to stand: 18.38 seconds Berg Balance Scale: 46/56  GAIT: Distance walked: around clinic/ around building Assistive device utilized: Environmental consultant - 4 wheeled Level of assistance: SBA Comments: Min. To mod. Fatigue reported.  No recent falls reported.     TODAY'S TREATMENT:  07/15/21:  Therex.:   Nustep L4 B UE/LE (consistent cadence)- discussed daily activities.    Seated (4#) marching/ LAQ/ heel raises 20x.  Standing (4#):  marching/ hip extension 20x.   6" step ups (4# ankle wts) in //-bars 10x on L/R.   Neuro.mm.:  Walking in //-bars with high marching/ lateral walking.  Forwards/ backwards 3 laps.  Resisted gait: 2BTB: 5x all 4-planes.  CGA for safety, esp.  With lateral walking.    Walking in //-bars with 6" step ups/ Airex step ups/ overs.    Walking outside on varying terrain/ outside stairs with 1 handrail.      STS from gray chair with no UE assist 10x.  Fatigue noted.   Alt. UE/LE touches in hallway (2 laps)-  pt. Had 2 episodes of scissoring gait requiring use of wall to self-correct LOB.  SBA/CGA for safety.  07/13/21:  There.ex.:  Nustep L4 10 min. B UE/LE (consistent cadence)- discussed weekend activities.    4# ankle wts.: seated marching/ LAQ/ standing heel raises/ hip abduction/ hip extension/ hamstring curls/ walking in //-bars 20x each.  Neuro.mm.:   Walking in //-bars with hurdle step overs (min. To no UE assist)- 5 laps.    Walking with cone touches (alternating)- min. UE assist.  Walking in hallway/ outside with use of SPC and consistent 2-point gait pattern on grassy terrain/ curbs/ mulch area.  No LOB but cuing to correct BOS/ posture.   NBOS/ tandem/ SLS in //-bars     PATIENT EDUCATION:  Education details: Access Code: [email or web address] Person educated: Patient Education method: Explanation, Demonstration, and Handouts Education comprehension: verbalized understanding and returned demonstration   HOME EXERCISE PROGRAM: Access Code: [email or web address] URL: [email or web address] Date: 07/06/2021 Prepared by: Dorcas Carrow   Exercises - Seated March  - 2 x daily - 7 x weekly - 1 sets - 10 reps - Seated Long Arc Quad  - 2 x daily - 7 x weekly - 1 sets - 10 reps - Sit to Stand with Counter Support  - 2 x daily - 7 x weekly - 1 sets - 10 reps - Seated Shoulder Flexion  - 2 x daily - 7 x weekly - 1 sets - 10 reps  ASSESSMENT:  CLINICAL IMPRESSION: Pt. Requires use of wall to self-correct LOB while completing alt UE/LE touches in hallway.  Pt. Benefits from SBA/CGA with all dynamic ex.   Moderate LE muscle fatigue noted after tx., esp. During STS.  Pt. Will continue with current HEP to increase LE  strength/ muscle endurance.   Pt. Encouraged to walk on a daily basis and use of SPC or rollator.  Pt. Will benefit from skilled PT services to increase LE muscle strength/ dynamic balance to improve functional mobility/ safety.     OBJECTIVE IMPAIRMENTS Abnormal gait, decreased activity tolerance, decreased balance, decreased coordination, decreased endurance, decreased mobility, difficulty walking, decreased strength, decreased safety awareness, impaired perceived functional ability, impaired flexibility, impaired UE functional use, and improper body mechanics.   ACTIVITY LIMITATIONS carrying, lifting, bending, sitting, standing, squatting, stairs, transfers, and bed mobility  PARTICIPATION LIMITATIONS: cleaning, community activity, and yard work  PERSONAL FACTORS Age, Past/current experiences, Social background, and 3+ comorbidities:      REHAB POTENTIAL: Fair (depression)  CLINICAL DECISION MAKING: Unstable/unpredictable  EVALUATION COMPLEXITY: High   GOALS: Goals reviewed with patient? Yes  SHORT TERM GOALS: Target date: 07/27/21  Pt. Will increase B LE muscle strength 1/2 muscle grade to improve functional mobility.   Baseline:  B LE strength grossly 4/5 MMT except hip flexion 4-/5 MMT, quad/hamstring 4+/5 MMT.   Goal status: INITIAL   LONG TERM GOALS: Target date: 08/17/21  Pt. Will increase Berg balance test to >50/56 to improve functional mobility.   Baseline:  Berg: 46/56 Goal status: INITIAL  2.  Pt. Will increase 5xSTS to <13 seconds to improve mobility Baseline: 18.38 seconds Goal status: INITIAL  3.  Pt. Will complete 30 minutes of there.ex./ walking with no increase c/o fatigue/ pain to improve community mobility.   Baseline:  pt. Currently not exercising Goal status: INITIAL   PLAN: PT FREQUENCY: 2x/week  PT DURATION: 6 weeks  PLANNED INTERVENTIONS: Therapeutic exercises, Therapeutic activity, Neuromuscular re-education, Balance training, Gait training,  Patient/Family education, Joint mobilization, Stair training, and DME instructions  PLAN FOR NEXT SESSION: Progress LE strength/ balance activities.   Pura Spice, PT, DPT # [phone]  07/15/2021, 2:29 PM

## 2021-07-16 DIAGNOSIS — K5900 Constipation, unspecified: Secondary | ICD-10-CM | POA: Diagnosis not present

## 2021-07-16 DIAGNOSIS — K5649 Other impaction of intestine: Secondary | ICD-10-CM | POA: Diagnosis not present

## 2021-07-16 DIAGNOSIS — D649 Anemia, unspecified: Secondary | ICD-10-CM | POA: Diagnosis not present

## 2021-07-21 ENCOUNTER — Ambulatory Visit: Payer: Medicare HMO | Admitting: Physical Therapy

## 2021-07-21 ENCOUNTER — Encounter: Payer: Self-pay | Admitting: Physical Therapy

## 2021-07-21 ENCOUNTER — Encounter: Payer: Medicare HMO | Admitting: Physical Therapy

## 2021-07-21 DIAGNOSIS — R2689 Other abnormalities of gait and mobility: Secondary | ICD-10-CM | POA: Diagnosis not present

## 2021-07-21 DIAGNOSIS — Z9181 History of falling: Secondary | ICD-10-CM

## 2021-07-21 DIAGNOSIS — R269 Unspecified abnormalities of gait and mobility: Secondary | ICD-10-CM | POA: Diagnosis not present

## 2021-07-21 DIAGNOSIS — M6281 Muscle weakness (generalized): Secondary | ICD-10-CM | POA: Diagnosis not present

## 2021-07-21 NOTE — Therapy (Signed)
OUTPATIENT PHYSICAL THERAPY LOWER EXTREMITY TREATMENT   Patient Name: David [name] MRN: [id number] DOB:1940/08/30, 81 y.o., male Today's Date: 07/15/2021   PT End of Session - 07/15/21 1429     Visit Number 5   Number of Visits 12    Date for PT Re-Evaluation 08/17/21    Authorization Type 5 of 11 visits (5/16 to 08/12/21)    PT Start Time 1114 to 1203  (48 min).     Equipment Utilized During Treatment Gait belt    Activity Tolerance Patient tolerated treatment well;Patient limited by fatigue    Behavior During Therapy WFL for tasks assessed/performed         Past Medical History:  Diagnosis Date   Allergic rhinitis    Anemia    Bipolar disorder (HCC)    Elevated PSA    had biopsy at [facility] around 2009-2010 negative for malignancy   Erectile dysfunction    GERD (gastroesophageal reflux disease)    Leucopenia    Osteoarthritis    in hands   Prostatitis    Sigmoid polyp    Past Surgical History:  Procedure Laterality Date   COLONOSCOPY  10/20/12   HERNIA REPAIR     Left knee replacement     Right shoulder replacement     Patient Active Problem List   Diagnosis Date Noted   Frail elderly 06/16/2021   Malnutrition of moderate degree 06/12/2021   Bipolar affective disorder in remission ([place]) 06/11/2021   BPH (benign prostatic hyperplasia) 06/11/2021   Constipation 06/10/2021   Abnormal SPEP 10/19/2018   Lymphopenia 06/07/2016   Abnormal weight loss 06/07/2016   Community acquired pneumonia of left lower lobe of lung    Pneumonia of left lower lobe due to Streptococcus pneumoniae ([place])    Acute respiratory failure ([place])    Hypotension 02/29/2016   IDA (iron deficiency anemia) 06/19/2014   Leucopenia 06/19/2014    PCP: Shanon Brow P.Ola Spurr, MD  REFERRING PROVIDER: Cheral Marker.Ola Spurr, MD  REFERRING DIAG: Hamstring tightness  THERAPY DIAG:  Balance disorder  Muscle weakness (generalized)  History of falling  Gait difficulty  Rationale for Evaluation and  Treatment Rehabilitation  ONSET DATE: 02/21/21  SUBJECTIVE:   SUBJECTIVE STATEMENT: Pt. Reports going to church and gender reveal party this past weekend.  No LOB or falls.  Pt. Reports no pain today.    PERTINENT HISTORY: David [name] is a 81 y.o. male here for new patient evaluation. Chief Complaint Chief Complaint  Patient presents with   Establish Care  Former [facility] Patient   HPI He is here to establish care. Forms Dr Raechel Ache patient. Ilda Basset is married - his wife Malachi Bonds is my patient. Retired from Press photographer at [employer]. Does not drink alcohol. Non-smoker. Married 24 years, 3 daughters, 78 grandchildren 36 great grand children.  He has a history of multiple medical problems including hyperlipidemia, arthritis, BPH, 12 deficiency, polyneuropathy, history of nephrolithiasis, mild cognitive impairment, vitamin B-12 deficiency. He is followed in the past with neurology for peripheral neuropathy and is treated with physical therapy. He is also followed with them for mild cognitive impairment. For B12 deficiency he was taking B12 oral supplements.  BPH he is on tamsulosin and follows with urology. For his history of nephrolithiasis he underwent cystoscopy and stent placement in 2021 and then stent removal. He seems to be doing well with this. He has chronic low back pain and is followed with physiatry and had L5-S1 injections.  In August he was seen for worsening depression.  He had no motivation. He had recently had a hamstring injury and was dealing with a lot of pain. He also was not eating and was listless. Patient at that time started on Remeron and home health was referred. His hamstring injury is doing better and healed.  Today his wife is with him. She reports he stopped all his medications including the Remeron. The only thing he is currently taking is the Flomax and vitamin supplements.  He is having loss of appetite. Food does not taste the same. He is eating one meal a day,  eating only a little at other times. Food does not taste the same. No dysphagia, no odynophagia. NO abd pain, no early satiety.  He is having insomnia and then not able to get out of bed in morning. He has depression and anhedonia. He previously was on remeron but had hallucinations so he is off it now.   He has a long hx of depression off and on and was followed at [facility] and [place] [facility]. Has been told he had bipolar as well. He has had therapy as well. He has been on over the years remeron, trazodone, wellbutrin, depakote, lamictal, lithium, zoloft, prozac, olanazpine, abilify, latuda, ritalin, nortriptyline.   Problem List Patient Active Problem List  Diagnosis   Hypercholesterolemia   Vasomotor rhinitis   Macular degeneration   Primary osteoarthritis of left knee   Benign prostatic hyperplasia without lower urinary tract symptoms   Vitamin D deficiency   Mild chronic anemia, unspecified   Leucopenia   Atherosclerosis of abdominal aorta (CMS-HCC)   Elevated PSA, less than 10 ng/ml   Proximal muscle weakness   Edema, lower extremity   Neuropathic pain of both feet   Leg cramps   B12 deficiency   Mixed sensory-motor polyneuropathy   Nephrolithiasis   Primary osteoarthritis of first carpometacarpal joint of right hand   Mild cognitive impairment   Erectile dysfunction due to arterial insufficiency   Adjustment disorder with mixed anxiety and depressed mood   Past Medical History:  Diagnosis Date   Abnormal weight loss 06/07/2016   Anemia   Bipolar affective disorder (CMS-HCC) 09/11/2012   Chronic tension-type headache, not intractable 04/13/2017   COVID-19 virus infection 02/24/2019   Depression   ED (erectile dysfunction)   Foot drop, left 12/17/2013   History of elevated PSA  S/P biopsy 04/2004   Hx of adenomatous polyp of colon 05/19/2008   Hypercholesterolemia   Kidney stone  history of obstruction with urosepsis; required stent 02/26/2019   Macular degeneration   Mixed  sensory-motor polyneuropathy 01/06/2014  on 01/06/2014 lower extremity EMG/NCVs   Mixed sensory-motor polyneuropathy   Non-traumatic rhabdomyolysis 08/13/2018   OA (osteoarthritis) of knee  left   Vasomotor rhinitis   Vitamin D deficiency   Past Surgical History:  Procedure Laterality Date   BIOPSY PROSTATE NEEDLE/PUNCH 7/06   CARPAL TUNNEL RELEASE   CYSTOSCOPY W/INSERTION/EXCHANGE URETERAL STENT Left 02/26/2019  for obstructing stone, with urosepsis   ENDOSCOPIC CARPAL TUNNEL RELEASE Bilateral 1993   INGUINAL HERNIA REPAIR Left 2004   JOINT REPLACEMENT shoulder and knee   kidney stone removal   kidney surgery 2021   REPAIR ROTATOR CUFF TEAR CHRONIC OPEN Right 2008  unsuccessful   REPLACEMENT TOTAL KNEE Left 2006   TONSILLECTOMY 1942   TOTAL SHOULDER REPLACEMENT Right 2009   Social History Social History   Socioeconomic History   Marital status: Married  Occupational History   Occupation: Retired  Tobacco Use   Smoking status:  Former  Packs/day: 1.00  Years: 19.00  Pack years: 19.00  Types: Cigarettes  Quit date: 02/21/1974  Years since quitting: 46.9   Smokeless tobacco: Never   Tobacco comments:  quit 1976  Vaping Use   Vaping Use: Never used  Substance and Sexual Activity   Alcohol use: Not Currently  Alcohol/week: 0.0 standard drinks  Comment: rare wine   Drug use: No   Sexual activity: Not Currently  Partners: Female  Social History Narrative  Lives with wife   Education: Some Secretary/administrator  Occupation: Retired  Hobbies: na  Marital Status: married   Family History Family History  Problem Relation Age of Onset   Heart failure Father   Coronary Artery Disease (Blocked arteries around heart) Father   High blood pressure (Hypertension) Father   Myocardial Infarction (Heart attack) Father   Breast cancer Mother   Stroke Mother   High blood pressure (Hypertension) Mother   Myasthenia gravis Mother   Arthritis Mother   Breast cancer Maternal Aunt    Breast cancer Maternal Aunt   Migraines Daughter   Colon cancer Neg Hx   Prostate cancer Neg Hx    PAIN:  Are you having pain? No  PRECAUTIONS: Fall  WEIGHT BEARING RESTRICTIONS No  FALLS:  Has patient fallen in last 6 months? Yes. Number of falls 2-3?  LIVING ENVIRONMENT: Lives with: lives with their family Lives in: House/apartment Stairs: Yes: External: 4 steps; on left going up Has following equipment at home: Gilford Rile - 4 wheeled  OCCUPATION: retired  PLOF: Needs assistance with gait  PATIENT GOALS Improve walking/ LE strengthening/ safety with walking   OBJECTIVE:   Evaluation 07/06/21  PATIENT SURVEYS:  Pt. Unable to complete FOTO.    COGNITION:  Overall cognitive status: Within functional limits for tasks assessed     SENSATION: WFL  MUSCLE LENGTH: Not tested during evaluation  POSTURE: rounded shoulders, forward head, and flexed trunk   LOWER EXTREMITY ROM:  B LE WFL, slight distal hamstring tightness bilaterally \ LOWER EXTREMITY MMT:  B LE strength grossly 4/5 MMT except hip flexion 4-/5 MMT, quad/hamstring 4+/5 MMT.    FUNCTIONAL TESTS:  5 times sit to stand: 18.38 seconds Berg Balance Scale: 46/56  GAIT: Distance walked: around clinic/ around building Assistive device utilized: Environmental consultant - 4 wheeled Level of assistance: SBA Comments: Min. To mod. Fatigue reported.  No recent falls reported.     TODAY'S TREATMENT:  07/21/21:  Neuro.mm.:  Walking in //-bars with high marching/ lateral/ diagonal walking 2 laps.  HR: 64 bpm O2 sat.: 96 bpm.     Resisted gait: 2BTB: 5x all 4-planes.  CGA for safety, esp. With lateral walking.    Walking in //-bars with 6"/12" green hurdles progressing from B UE assist to no UE assist.    Airex: step ups/overs/backwards/lateral 5x2 on L/R.      Alt. UE/LE touches in //-bars (2 laps)- pt. Had 1 LOB today and required CGA to assist.    Walking outside on varying terrain/ outside stairs with 1 handrail.       Scifit L4 B UE/LE for 8 minutes (consistent cadence).    07/15/21:  Therex.:   Scifit L4 B UE/LE (consistent cadence)- discussed daily activities.    Seated (4#) marching/ LAQ/ heel raises 20x.  Standing (4#):  marching/ hip extension 20x.   6" step ups (4# ankle wts) in //-bars 10x on L/R.   Neuro.mm.:  Walking in //-bars with high marching/ lateral walking.  Forwards/ backwards 3  laps.  Resisted gait: 2BTB: 5x all 4-planes.  CGA for safety, esp. With lateral walking.    Walking in //-bars with 6" step ups/ Airex step ups/ overs.    Walking outside on varying terrain/ outside stairs with 1 handrail.      STS from gray chair with no UE assist 10x.  Fatigue noted.   Alt. UE/LE touches in hallway (2 laps)-  pt. Had 2 episodes of scissoring gait requiring use of wall to self-correct LOB.  SBA/CGA for safety.     07/13/21:  There.ex.:  Nustep L4 10 min. B UE/LE (consistent cadence)- discussed weekend activities.    4# ankle wts.: seated marching/ LAQ/ standing heel raises/ hip abduction/ hip extension/ hamstring curls/ walking in //-bars 20x each.  Neuro.mm.:   Walking in //-bars with hurdle step overs (min. To no UE assist)- 5 laps.    Walking with cone touches (alternating)- min. UE assist.  Walking in hallway/ outside with use of SPC and consistent 2-point gait pattern on grassy terrain/ curbs/ mulch area.  No LOB but cuing to correct BOS/ posture.   NBOS/ tandem/ SLS in //-bars     PATIENT EDUCATION:  Education details: Access Code: [email or web address] Person educated: Patient Education method: Explanation, Demonstration, and Handouts Education comprehension: verbalized understanding and returned demonstration   HOME EXERCISE PROGRAM: Access Code: [email or web address] URL: [email or web address] Date: 07/06/2021 Prepared by: Dorcas Carrow   Exercises - Seated March  - 2 x daily - 7 x weekly - 1 sets - 10 reps - Seated Long Arc Quad  - 2 x daily - 7 x weekly - 1  sets - 10 reps - Sit to Stand with Counter Support  - 2 x daily - 7 x weekly - 1 sets - 10 reps - Seated Shoulder Flexion  - 2 x daily - 7 x weekly - 1 sets - 10 reps  ASSESSMENT:  CLINICAL IMPRESSION:  Pt. presented to the clinic with minimal discomfort, and has not had any recent falls. Pt. required verbal cueing during 6"/12" green hurdles to further flex his hip in order to clear the hurdles, and continuously caught his rear foot on the 12" hurdle. After four attempts at the 12" hurdle, they were discontinued. Pt. Demonstrated good balance and motor control during forward step-overs on the airex pad without utilizing his hands, but backwards stepping required verbal cueing to increase his step length in order to clear his foot above the airex. Pt. will benefit from further mobility training in order to improve his balance and motor coordination during ambulation.   OBJECTIVE IMPAIRMENTS Abnormal gait, decreased activity tolerance, decreased balance, decreased coordination, decreased endurance, decreased mobility, difficulty walking, decreased strength, decreased safety awareness, impaired perceived functional ability, impaired flexibility, impaired UE functional use, and improper body mechanics.   ACTIVITY LIMITATIONS carrying, lifting, bending, sitting, standing, squatting, stairs, transfers, and bed mobility  PARTICIPATION LIMITATIONS: cleaning, community activity, and yard work  PERSONAL FACTORS Age, Past/current experiences, Social background, and 3+ comorbidities:      REHAB POTENTIAL: Fair (depression)  CLINICAL DECISION MAKING: Unstable/unpredictable  EVALUATION COMPLEXITY: High   GOALS: Goals reviewed with patient? Yes  SHORT TERM GOALS: Target date: 07/27/21  Pt. Will increase B LE muscle strength 1/2 muscle grade to improve functional mobility.   Baseline:  B LE strength grossly 4/5 MMT except hip flexion 4-/5 MMT, quad/hamstring 4+/5 MMT.   Goal status: INITIAL   LONG  TERM GOALS: Target date: 08/17/21  Pt. Will increase Berg balance test to >50/56  to improve functional mobility.   Baseline:  Berg: 46/56 Goal status: INITIAL  2.  Pt. Will increase 5xSTS to <13 seconds to improve mobility Baseline: 18.38 seconds Goal status: INITIAL  3.  Pt. Will complete 30 minutes of there.ex./ walking with no increase c/o fatigue/ pain to improve community mobility.   Baseline:  pt. Currently not exercising Goal status: INITIAL   PLAN: PT FREQUENCY: 2x/week  PT DURATION: 6 weeks  PLANNED INTERVENTIONS: Therapeutic exercises, Therapeutic activity, Neuromuscular re-education, Balance training, Gait training, Patient/Family education, Joint mobilization, Stair training, and DME instructions  PLAN FOR NEXT SESSION: Progress LE strength/ balance activities.   Pura Spice, PT, DPT # [id number]  07/21/2021, 12:39 PM

## 2021-07-23 ENCOUNTER — Other Ambulatory Visit: Payer: Self-pay

## 2021-07-23 NOTE — Patient Outreach (Signed)
[place] [facility] [facility]) Care Management  07/23/2021  CAILLOU MINUS 15-May-1940 [id number]   Transition of Care Referral   Referral Date: 06/28/2021 Referral Source:Discharge Report Date of Discharge: 06/25/2021 Facility: [facility]    Multiple attempts to establish contact with patient without success. No response from letter mailed to patient. Case is being closed at this time.    Plan: RN CM will close case.  Enzo Montgomery, RN,BSN,CCM [place] Management Telephonic Care Management Coordinator Direct Phone: [phone] Toll Free: [phone] Fax: [phone]

## 2021-07-26 ENCOUNTER — Encounter: Payer: Medicare HMO | Admitting: Physical Therapy

## 2021-07-27 ENCOUNTER — Encounter: Payer: Medicare HMO | Admitting: Physical Therapy

## 2021-07-28 ENCOUNTER — Encounter: Payer: Medicare HMO | Admitting: Physical Therapy

## 2021-07-29 ENCOUNTER — Encounter: Payer: Medicare HMO | Admitting: Physical Therapy

## 2021-08-02 ENCOUNTER — Encounter: Payer: Medicare HMO | Admitting: Physical Therapy

## 2021-08-03 ENCOUNTER — Encounter: Payer: Medicare HMO | Admitting: Physical Therapy

## 2021-08-04 ENCOUNTER — Encounter: Payer: Medicare HMO | Admitting: Physical Therapy

## 2021-08-05 ENCOUNTER — Encounter: Payer: Medicare HMO | Admitting: Physical Therapy

## 2021-08-06 DIAGNOSIS — D61818 Other pancytopenia: Secondary | ICD-10-CM | POA: Diagnosis not present

## 2021-08-06 DIAGNOSIS — K5900 Constipation, unspecified: Secondary | ICD-10-CM | POA: Diagnosis not present

## 2021-08-06 DIAGNOSIS — I700 Atherosclerosis of aorta: Secondary | ICD-10-CM | POA: Diagnosis not present

## 2021-08-06 DIAGNOSIS — K5649 Other impaction of intestine: Secondary | ICD-10-CM | POA: Diagnosis not present

## 2021-08-06 DIAGNOSIS — D649 Anemia, unspecified: Secondary | ICD-10-CM | POA: Diagnosis not present

## 2021-08-06 DIAGNOSIS — R634 Abnormal weight loss: Secondary | ICD-10-CM | POA: Diagnosis not present

## 2021-08-09 ENCOUNTER — Encounter: Payer: Medicare HMO | Admitting: Physical Therapy

## 2021-08-10 ENCOUNTER — Ambulatory Visit: Payer: Medicare HMO | Admitting: Physical Therapy

## 2021-08-11 ENCOUNTER — Encounter: Payer: Medicare HMO | Admitting: Physical Therapy

## 2021-08-12 ENCOUNTER — Encounter: Payer: Medicare HMO | Admitting: Physical Therapy

## 2021-08-16 ENCOUNTER — Encounter: Payer: Medicare HMO | Admitting: Physical Therapy

## 2021-08-18 ENCOUNTER — Encounter: Payer: Medicare HMO | Admitting: Physical Therapy

## 2021-08-23 ENCOUNTER — Encounter: Payer: Medicare HMO | Admitting: Physical Therapy

## 2021-08-25 ENCOUNTER — Encounter: Payer: Medicare HMO | Admitting: Physical Therapy

## 2021-08-25 DIAGNOSIS — R627 Adult failure to thrive: Secondary | ICD-10-CM | POA: Diagnosis not present

## 2021-08-25 DIAGNOSIS — Z125 Encounter for screening for malignant neoplasm of prostate: Secondary | ICD-10-CM | POA: Diagnosis not present

## 2021-08-25 DIAGNOSIS — M47816 Spondylosis without myelopathy or radiculopathy, lumbar region: Secondary | ICD-10-CM | POA: Diagnosis not present

## 2021-08-25 DIAGNOSIS — K5900 Constipation, unspecified: Secondary | ICD-10-CM | POA: Diagnosis not present

## 2021-08-25 DIAGNOSIS — D61818 Other pancytopenia: Secondary | ICD-10-CM | POA: Diagnosis not present

## 2021-08-25 DIAGNOSIS — R634 Abnormal weight loss: Secondary | ICD-10-CM | POA: Diagnosis not present

## 2021-08-25 DIAGNOSIS — D649 Anemia, unspecified: Secondary | ICD-10-CM | POA: Diagnosis not present

## 2021-08-25 DIAGNOSIS — F319 Bipolar disorder, unspecified: Secondary | ICD-10-CM | POA: Diagnosis not present

## 2021-09-02 DIAGNOSIS — N400 Enlarged prostate without lower urinary tract symptoms: Secondary | ICD-10-CM | POA: Diagnosis not present

## 2021-09-02 DIAGNOSIS — D509 Iron deficiency anemia, unspecified: Secondary | ICD-10-CM | POA: Diagnosis not present

## 2021-09-02 DIAGNOSIS — E46 Unspecified protein-calorie malnutrition: Secondary | ICD-10-CM | POA: Diagnosis not present

## 2021-09-02 DIAGNOSIS — G629 Polyneuropathy, unspecified: Secondary | ICD-10-CM | POA: Diagnosis not present

## 2021-09-02 DIAGNOSIS — E785 Hyperlipidemia, unspecified: Secondary | ICD-10-CM | POA: Diagnosis not present

## 2021-09-02 DIAGNOSIS — F0390 Unspecified dementia without behavioral disturbance: Secondary | ICD-10-CM | POA: Diagnosis not present

## 2021-09-02 DIAGNOSIS — F314 Bipolar disorder, current episode depressed, severe, without psychotic features: Secondary | ICD-10-CM | POA: Diagnosis not present

## 2021-10-22 DEATH — deceased
